# Patient Record
Sex: Male | Born: 1940 | Race: White | Hispanic: No | Marital: Married | Smoking: Former smoker
Health system: Southern US, Community
[De-identification: ages and names within clinical notes are randomized; demographics above are authoritative.]

## PROBLEM LIST (undated history)

## (undated) DIAGNOSIS — M459 Ankylosing spondylitis of unspecified sites in spine: Secondary | ICD-10-CM

## (undated) DIAGNOSIS — M1990 Unspecified osteoarthritis, unspecified site: Secondary | ICD-10-CM

## (undated) DIAGNOSIS — I10 Essential (primary) hypertension: Secondary | ICD-10-CM

## (undated) DIAGNOSIS — I639 Cerebral infarction, unspecified: Secondary | ICD-10-CM

## (undated) DIAGNOSIS — G629 Polyneuropathy, unspecified: Secondary | ICD-10-CM

## (undated) DIAGNOSIS — G4730 Sleep apnea, unspecified: Secondary | ICD-10-CM

## (undated) DIAGNOSIS — G40909 Epilepsy, unspecified, not intractable, without status epilepticus: Secondary | ICD-10-CM

## (undated) DIAGNOSIS — E119 Type 2 diabetes mellitus without complications: Secondary | ICD-10-CM

## (undated) DIAGNOSIS — E785 Hyperlipidemia, unspecified: Secondary | ICD-10-CM

## (undated) HISTORY — DX: Ankylosing spondylitis of unspecified sites in spine: M45.9

## (undated) HISTORY — DX: Unspecified osteoarthritis, unspecified site: M19.90

## (undated) HISTORY — PX: NO PAST SURGERIES: SHX2092

## (undated) HISTORY — DX: Sleep apnea, unspecified: G47.30

## (undated) HISTORY — DX: Type 2 diabetes mellitus without complications: E11.9

## (undated) HISTORY — DX: Essential (primary) hypertension: I10

## (undated) HISTORY — DX: Epilepsy, unspecified, not intractable, without status epilepticus: G40.909

## (undated) HISTORY — DX: Hyperlipidemia, unspecified: E78.5

## (undated) HISTORY — DX: Cerebral infarction, unspecified: I63.9

---
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
## 1997-12-29 ENCOUNTER — Ambulatory Visit (HOSPITAL_COMMUNITY): Admission: RE | Admit: 1997-12-29 | Discharge: 1997-12-29 | Payer: Self-pay | Admitting: Family Medicine

## 2000-12-09 ENCOUNTER — Ambulatory Visit (HOSPITAL_COMMUNITY): Admission: RE | Admit: 2000-12-09 | Discharge: 2000-12-09 | Payer: Self-pay | Admitting: Family Medicine

## 2000-12-09 ENCOUNTER — Encounter: Payer: Self-pay | Admitting: Family Medicine

## 2001-06-16 ENCOUNTER — Ambulatory Visit (HOSPITAL_COMMUNITY): Admission: RE | Admit: 2001-06-16 | Discharge: 2001-06-16 | Payer: Self-pay | Admitting: Family Medicine

## 2002-04-06 ENCOUNTER — Ambulatory Visit (HOSPITAL_COMMUNITY): Admission: RE | Admit: 2002-04-06 | Discharge: 2002-04-06 | Payer: Self-pay | Admitting: Neurology

## 2002-04-27 ENCOUNTER — Ambulatory Visit (HOSPITAL_BASED_OUTPATIENT_CLINIC_OR_DEPARTMENT_OTHER): Admission: RE | Admit: 2002-04-27 | Discharge: 2002-04-27 | Payer: Self-pay | Admitting: Neurology

## 2002-04-28 ENCOUNTER — Ambulatory Visit (HOSPITAL_BASED_OUTPATIENT_CLINIC_OR_DEPARTMENT_OTHER): Admission: RE | Admit: 2002-04-28 | Discharge: 2002-04-28 | Payer: Self-pay | Admitting: Neurology

## 2009-05-23 DIAGNOSIS — G40909 Epilepsy, unspecified, not intractable, without status epilepticus: Secondary | ICD-10-CM

## 2009-05-23 HISTORY — DX: Epilepsy, unspecified, not intractable, without status epilepticus: G40.909

## 2009-06-10 ENCOUNTER — Ambulatory Visit: Payer: Self-pay | Admitting: Cardiology

## 2009-06-10 ENCOUNTER — Inpatient Hospital Stay (HOSPITAL_COMMUNITY): Admission: EM | Admit: 2009-06-10 | Discharge: 2009-06-15 | Payer: Self-pay | Admitting: Emergency Medicine

## 2009-06-13 ENCOUNTER — Encounter (INDEPENDENT_AMBULATORY_CARE_PROVIDER_SITE_OTHER): Payer: Self-pay | Admitting: Neurology

## 2009-06-19 ENCOUNTER — Encounter (INDEPENDENT_AMBULATORY_CARE_PROVIDER_SITE_OTHER): Payer: Self-pay | Admitting: *Deleted

## 2009-06-19 ENCOUNTER — Ambulatory Visit: Payer: Self-pay | Admitting: Internal Medicine

## 2009-06-19 ENCOUNTER — Ambulatory Visit: Payer: Self-pay

## 2009-06-19 ENCOUNTER — Encounter (HOSPITAL_COMMUNITY): Admission: RE | Admit: 2009-06-19 | Discharge: 2009-08-23 | Payer: Self-pay | Admitting: Internal Medicine

## 2009-07-03 ENCOUNTER — Encounter: Payer: Self-pay | Admitting: Internal Medicine

## 2009-07-06 ENCOUNTER — Telehealth: Payer: Self-pay | Admitting: Internal Medicine

## 2009-11-07 ENCOUNTER — Telehealth (INDEPENDENT_AMBULATORY_CARE_PROVIDER_SITE_OTHER): Payer: Self-pay | Admitting: *Deleted

## 2010-04-26 NOTE — Assessment & Plan Note (Signed)
Summary: Cardiology Nuclear Study  Nuclear Med Background Indications for Stress Test: Evaluation for Ischemia, Post Hospital  Indications Comments: 06/10/09 New onset seizure activity, Increased cardiac enzymes  History: GXT  History Comments: 4-5 yrs. ago [id number]  Symptoms: Dizziness, Fatigue    Nuclear Pre-Procedure Cardiac Risk Factors: CVA, History of Smoking, Hypertension, Lipids, NIDDM Caffeine/Decaff Intake: None NPO After: 7:00 AM Lungs: Clear.  O2 Sat 99% on RA. IV 0.9% NS with Angio Cath: 22g     IV Site: (R) Wrist IV Started by: Irean Hong RN Chest Size (in) 44     Height (in): 70 Weight (lb): 198 BMI: 28.51 Tech Comments: The patient took metoprolol this am, and also complaining of stiffness in back since seizure episode, changed to [place].  Patsy Edwards,RN.  Nuclear Med Study 1 or 2 day study:  1 day     Stress Test Type:  Eugenie Birks Reading MD:  Dietrich Pates, MD     Referring MD:  Arvilla Meres, MD Resting Radionuclide:  Technetium 31m Tetrofosmin     Resting Radionuclide Dose:  10 mCi  Stress Radionuclide:  Technetium 48m Tetrofosmin     Stress Radionuclide Dose:  33 mCi   Stress Protocol   Lexiscan: 0.4 mg   Stress Test Technologist:  [employer] CMA-N     Nuclear Technologist:  Domenic Polite CNMT  Rest Procedure  Myocardial perfusion imaging was performed at rest 45 minutes following the intravenous administration of Myoview Technetium 45m Tetrofosmin.  Stress Procedure  The patient received IV Lexiscan 0.4 mg over 15-seconds.  Myoview injected at 30-seconds.  There were no significant changes with lexiscan.  There were frequent PVC's with couplets.  Quantitative spect images were obtained after a 45 minute delay.  QPS Raw Data Images:  Soft tissue (diaphragm, bowel activity) underlie heart. Stress Images:  There is normal uptake in all areas. Rest Images:  Normal with minimal apical thinning Subtraction (SDS):  No evidence of  ischemia. Transient Ischemic Dilatation:  1.11  (Normal <1.22)  Lung/Heart Ratio:  .36  (Normal <0.45)  Quantitative Gated Spect Images QGS EDV:  131 ml QGS ESV:  63 ml QGS EF:  52 %   Overall Impression  Exercise Capacity: Lexiscan protocol BP Response: Normal blood pressure response. Clinical Symptoms: Short of breath ECG Impression: No significant ST segment change suggestive of ischemia.  Occasional PVCs, couplets Overall Impression Comments: Normal perfusion.  No ischemia.  LVEF 52%, by visual estimate appears somewhat greater.  Appended Document: Cardiology Nuclear Study looks good. no further cardiac w/u at this time.  Appended Document: Cardiology Nuclear Study have attempted to call pt for a week w/busy tone, letter mailed  Appended Document: Cardiology Nuclear Study pt called back and received results

## 2010-04-26 NOTE — Progress Notes (Signed)
Summary: returning call  Phone Note Call from Patient Call back at Home Phone [phone] Call back at [phone]   Caller: Patient Summary of Call: returning call  Initial call taken by: Migdalia Dk,  July 06, 2009 11:33 AM  Follow-up for Phone Call        pt aware of stress test results Meredith Staggers, RN  July 06, 2009 1:18 PM

## 2010-04-26 NOTE — Miscellaneous (Signed)
Summary: Outpatient Coinsurance Notice  Outpatient Coinsurance Notice   Imported By: Marylou Mccoy 06/29/2009 08:45:20  _____________________________________________________________________  External Attachment:    Type:   Image     Comment:   External Document

## 2010-04-26 NOTE — Progress Notes (Signed)
  Pt Signed ROI,Faxed his Stress over to [place] per his request. Erle Crocker  November 07, 2009 9:39 AM

## 2010-04-26 NOTE — Letter (Signed)
Summary: Generic Letter  Architectural technologist, Main Office  [address]   [place], [place] [address]   Phone: [phone]  Fax: [phone]        July 03, 2009 MRN: [id number]    Andrew Jones [address] [place], [place]  [address]    Dear Andrew Jones,  We have been unable to reach you by phone regarding your test results.  Please give our office a call to receive your results.     Sincerely,  Meredith Staggers, RN Arvilla Meres, MD  This letter has been electronically signed by your physician.

## 2010-06-18 LAB — URINALYSIS, ROUTINE W REFLEX MICROSCOPIC
Glucose, UA: NEGATIVE mg/dL
Hgb urine dipstick: NEGATIVE
Ketones, ur: NEGATIVE mg/dL
Protein, ur: NEGATIVE mg/dL
pH: 6 (ref 5.0–8.0)

## 2010-06-18 LAB — RAPID URINE DRUG SCREEN, HOSP PERFORMED
Amphetamines: NOT DETECTED
Barbiturates: NOT DETECTED
Benzodiazepines: POSITIVE — AB
Tetrahydrocannabinol: NOT DETECTED

## 2010-06-18 LAB — GLUCOSE, CAPILLARY
Glucose-Capillary: 100 mg/dL — ABNORMAL HIGH (ref 70–99)
Glucose-Capillary: 109 mg/dL — ABNORMAL HIGH (ref 70–99)
Glucose-Capillary: 116 mg/dL — ABNORMAL HIGH (ref 70–99)
Glucose-Capillary: 122 mg/dL — ABNORMAL HIGH (ref 70–99)
Glucose-Capillary: 126 mg/dL — ABNORMAL HIGH (ref 70–99)
Glucose-Capillary: 137 mg/dL — ABNORMAL HIGH (ref 70–99)
Glucose-Capillary: 140 mg/dL — ABNORMAL HIGH (ref 70–99)
Glucose-Capillary: 162 mg/dL — ABNORMAL HIGH (ref 70–99)
Glucose-Capillary: 208 mg/dL — ABNORMAL HIGH (ref 70–99)
Glucose-Capillary: 215 mg/dL — ABNORMAL HIGH (ref 70–99)
Glucose-Capillary: 274 mg/dL — ABNORMAL HIGH (ref 70–99)
Glucose-Capillary: 84 mg/dL (ref 70–99)

## 2010-06-18 LAB — COMPREHENSIVE METABOLIC PANEL
ALT: 17 U/L (ref 0–53)
AST: 24 U/L (ref 0–37)
Albumin: 3.7 g/dL (ref 3.5–5.2)
Alkaline Phosphatase: 65 U/L (ref 39–117)
CO2: 18 mEq/L — ABNORMAL LOW (ref 19–32)
Calcium: 9 mg/dL (ref 8.4–10.5)
Glucose, Bld: 186 mg/dL — ABNORMAL HIGH (ref 70–99)
Potassium: 5.1 mEq/L (ref 3.5–5.1)
Sodium: 136 mEq/L (ref 135–145)
Total Bilirubin: 0.7 mg/dL (ref 0.3–1.2)

## 2010-06-18 LAB — URINALYSIS, MICROSCOPIC ONLY
Glucose, UA: NEGATIVE mg/dL
Ketones, ur: >80 mg/dL — AB
pH: 5.5 (ref 5.0–8.0)

## 2010-06-18 LAB — BASIC METABOLIC PANEL
BUN: 15 mg/dL (ref 6–23)
Calcium: 8.8 mg/dL (ref 8.4–10.5)
Creatinine, Ser: 1 mg/dL (ref 0.4–1.5)
GFR calc non Af Amer: >60 mL/min (ref >60)
Potassium: 4.1 mEq/L (ref 3.5–5.1)

## 2010-06-18 LAB — URINE CULTURE

## 2010-06-18 LAB — PROTIME-INR: INR: 1.13 (ref 0.00–1.49)

## 2010-06-18 LAB — CBC
HCT: 34.4 % — ABNORMAL LOW (ref 39.0–52.0)
HCT: 36.2 % — ABNORMAL LOW (ref 39.0–52.0)
Hemoglobin: 11.9 g/dL — ABNORMAL LOW (ref 13.0–17.0)
Hemoglobin: 12.4 g/dL — ABNORMAL LOW (ref 13.0–17.0)
MCHC: 34.3 g/dL (ref 30.0–36.0)
MCHC: 34.7 g/dL (ref 30.0–36.0)
MCV: 87.9 fL (ref 78.0–100.0)
RBC: 3.92 MIL/uL — ABNORMAL LOW (ref 4.22–5.81)
RBC: 4.06 MIL/uL — ABNORMAL LOW (ref 4.22–5.81)
RDW: 14.7 % (ref 11.5–15.5)

## 2010-06-18 LAB — CULTURE, BLOOD (ROUTINE X 2)
Culture: NO GROWTH
Culture: NO GROWTH

## 2010-06-18 LAB — POCT I-STAT, CHEM 8
Glucose, Bld: 183 mg/dL — ABNORMAL HIGH (ref 70–99)
HCT: 36 % — ABNORMAL LOW (ref 39.0–52.0)
Hemoglobin: 12.2 g/dL — ABNORMAL LOW (ref 13.0–17.0)
Potassium: 5 mEq/L (ref 3.5–5.1)
Sodium: 137 mEq/L (ref 135–145)

## 2010-06-18 LAB — DIFFERENTIAL
Eosinophils Relative: 2 % (ref 0–5)
Lymphocytes Relative: 17 % (ref 12–46)
Monocytes Absolute: 0.5 10*3/uL (ref 0.1–1.0)
Neutro Abs: 9.9 10*3/uL — ABNORMAL HIGH (ref 1.7–7.7)

## 2010-06-18 LAB — PHENYTOIN LEVEL, TOTAL: Phenytoin Lvl: 3 ug/mL — ABNORMAL LOW (ref 10.0–20.0)

## 2010-06-18 LAB — CARDIAC PANEL(CRET KIN+CKTOT+MB+TROPI)
Relative Index: 0.1 (ref 0.0–2.5)
Relative Index: 0.1 (ref 0.0–2.5)
Total CK: 6280 U/L — ABNORMAL HIGH (ref 7–232)

## 2010-06-18 LAB — APTT: aPTT: 28 seconds (ref 24–37)

## 2010-06-18 LAB — MRSA PCR SCREENING: MRSA by PCR: NEGATIVE

## 2010-06-18 LAB — CK: Total CK: 377 U/L — ABNORMAL HIGH (ref 7–232)

## 2010-06-18 LAB — POCT CARDIAC MARKERS

## 2011-07-10 LAB — BASIC METABOLIC PANEL
Creatinine: 0.8 mg/dL (ref 0.6–1.3)
Glucose: 119 mg/dL
Potassium: 4.7 mmol/L (ref 3.4–5.3)
Sodium: 137 mmol/L (ref 137–147)

## 2011-07-10 LAB — HEPATIC FUNCTION PANEL: AST: 17 U/L (ref 14–40)

## 2011-07-10 LAB — TSH: TSH: 1.9 u[IU]/mL (ref 0.41–5.90)

## 2011-09-16 ENCOUNTER — Encounter: Payer: Self-pay | Admitting: Internal Medicine

## 2011-09-16 ENCOUNTER — Ambulatory Visit (INDEPENDENT_AMBULATORY_CARE_PROVIDER_SITE_OTHER): Payer: Medicare Other | Admitting: Internal Medicine

## 2011-09-16 VITALS — BP 136/64 | HR 69 | Temp 97.9°F | Resp 16 | Ht 68.0 in | Wt 184.8 lb

## 2011-09-16 DIAGNOSIS — Z0000 Encounter for general adult medical examination without abnormal findings: Secondary | ICD-10-CM

## 2011-09-16 DIAGNOSIS — I639 Cerebral infarction, unspecified: Secondary | ICD-10-CM | POA: Insufficient documentation

## 2011-09-16 DIAGNOSIS — E119 Type 2 diabetes mellitus without complications: Secondary | ICD-10-CM

## 2011-09-16 DIAGNOSIS — M1990 Unspecified osteoarthritis, unspecified site: Secondary | ICD-10-CM | POA: Insufficient documentation

## 2011-09-16 DIAGNOSIS — G40909 Epilepsy, unspecified, not intractable, without status epilepticus: Secondary | ICD-10-CM

## 2011-09-16 DIAGNOSIS — Z1211 Encounter for screening for malignant neoplasm of colon: Secondary | ICD-10-CM

## 2011-09-16 DIAGNOSIS — G44009 Cluster headache syndrome, unspecified, not intractable: Secondary | ICD-10-CM | POA: Insufficient documentation

## 2011-09-16 DIAGNOSIS — I10 Essential (primary) hypertension: Secondary | ICD-10-CM

## 2011-09-16 DIAGNOSIS — E785 Hyperlipidemia, unspecified: Secondary | ICD-10-CM | POA: Insufficient documentation

## 2011-09-16 DIAGNOSIS — Z23 Encounter for immunization: Secondary | ICD-10-CM

## 2011-09-16 DIAGNOSIS — E1169 Type 2 diabetes mellitus with other specified complication: Secondary | ICD-10-CM | POA: Insufficient documentation

## 2011-09-16 NOTE — Assessment & Plan Note (Signed)
BP Readings from Last 3 Encounters:  09/16/11 136/64   The current medical regimen is effective;  continue present plan and medications.

## 2011-09-16 NOTE — Assessment & Plan Note (Signed)
The current medical regimen is effective;  continue present plan and medications. Lab Results  Component Value Date   HGBA1C 5.4 07/10/2011

## 2011-09-16 NOTE — Progress Notes (Signed)
Subjective:    Patient ID: Andrew Jones, male    DOB: [birth year], 71 y.o.   MRN: [id number]  HPI  New pt to me and our practice, here to est care - prev followed at [facility] and annually at [facility] Here to establish care  Also here for medicare wellness  Diet: heart healthy and diabetic Physical activity: active Depression/mood screen: negative Hearing: intact to whispered voice (B hearing aides intact) Visual acuity: grossly normal, performs annual eye exam  ADLs: capable Fall risk: none Home safety: good Cognitive evaluation: intact to orientation, naming, recall and repetition EOL planning: adv directives, full code/ I agree  I have personally reviewed and have noted 1. The patient's medical and social history 2. Their use of alcohol, tobacco or illicit drugs 3. Their current medications and supplements 4. The patient's functional ability including ADL's, fall risks, home safety risks and hearing or visual impairment. 5. Diet and physical activities 6. Evidence for depression or mood disorders   Also reviewed chronic medical issues  DM2 - on met + pioglip - the patient reports compliance with medication(s) as prescribed. Denies adverse side effects. Checks cbgs 3-4x/wk - no hypoglycema  HTN - the patient reports compliance with medication(s) as prescribed. Denies adverse side effects.  AS - dx 2000 - pain in LBP and hips as well as R MCPs - controlled with daily NSAID - no change on Enbrel therapy so stopped same in 2008   Past Medical History  Diagnosis Date  . Diabetes mellitus, type 2   . Seizure disorder 05/2009  . Hypertension   . Dyslipidemia   . CVA (cerebral vascular accident) 1999, 2002    no residual problems  . Cluster headaches   . Osteoarthritis   . Ankylosing spondylitis     back, hips - no response to embrel in 2000   Family History  Problem Relation Age of Onset  . Heart failure Mother   . COPD Father    History  Substance Use Topics  .  Smoking status: Former Games developer  . Smokeless tobacco: Not on file  . Alcohol Use: Not on file    Review of Systems Constitutional: Negative for fever or weight change.  Respiratory: Negative for cough and shortness of breath.   Cardiovascular: Negative for chest pain or palpitations.  Gastrointestinal: Negative for abdominal pain, no bowel changes.  Musculoskeletal: Negative for gait problem or joint swelling.  Skin: Negative for rash.  Neurological: Negative for dizziness or headache.  No other specific complaints in a complete review of systems (except as listed in HPI above).     Objective:   Physical Exam BP 136/64  Pulse 69  Temp 97.9 F (36.6 C) (Oral)  Resp 16  Ht 5\' 8"  (1.727 m)  Wt 184 lb 12 oz (83.802 kg)  BMI 28.09 kg/m2  SpO2 98% Wt Readings from Last 3 Encounters:  09/16/11 184 lb 12 oz (83.802 kg)  06/19/09 198 lb (89.812 kg)   Constitutional:  He appears well-developed and well-nourished. No distress.  Neck: Normal range of motion. Neck supple. No JVD present. No thyromegaly present.  Cardiovascular: Normal rate, regular rhythm and normal heart sounds.  No murmur heard. no BLE edema Pulmonary/Chest: Effort normal and breath sounds normal. No respiratory distress. no wheezes.  Abdominal: Soft. Bowel sounds are normal. Patient exhibits no distension. There is no tenderness.  Musculoskeletal: Normal range of motion. Patient exhibits no edema.  Neurological: he is alert and oriented to person, place, and time. No  cranial nerve deficit. Coordination normal.  Skin: Skin is warm and dry.  No erythema or ulceration.  Psychiatric: he has a normal mood and affect. behavior is normal. Judgment and thought content normal.   Lab Results  Component Value Date   WBC 12.8* 06/11/2009   HGB 11.9* 06/11/2009   HCT 34.4* 06/11/2009   PLT 201 06/11/2009   GLUCOSE 158* 06/11/2009   ALT 22 07/10/2011   AST 17 07/10/2011   NA 137 07/10/2011   K 4.7 07/10/2011   CL 106 06/11/2009    CREATININE 0.8 07/10/2011   BUN 22* 07/10/2011   CO2 21 06/11/2009   TSH 1.90 07/10/2011   INR 1.13 06/10/2009   HGBA1C 5.4 07/10/2011        Assessment & Plan:    CPX/v70.0 - Patient has been counseled on age-appropriate routine health concerns for screening and prevention. These are reviewed and up-to-date. Immunizations are up-to-date or declined. Labs from [place] 06/2011 reviewed.

## 2011-09-16 NOTE — Assessment & Plan Note (Signed)
On prava and lopid - [facility] The current medical regimen is effective;  continue present plan and medications.  

## 2011-09-16 NOTE — Patient Instructions (Signed)
It was good to see you today. We have reviewed your prior records including labs and tests today Medications reviewed, no changes at this time. Health Maintenance reviewed tetanus updated, refer for colonoscopy -all other recommended immunizations and age-appropriate screenings are up-to-date. we'll make referral to [facility] for colonoscopy screening. Our office will contact you regarding appointment(s) once made. Please schedule followup in 6 months, call sooner if problems.

## 2011-09-16 NOTE — Assessment & Plan Note (Signed)
No breakthru events since 2011 event The current medical regimen is effective;  continue present plan and medications.  

## 2011-09-18 ENCOUNTER — Encounter: Payer: Self-pay | Admitting: Internal Medicine

## 2011-10-30 ENCOUNTER — Encounter: Payer: Self-pay | Admitting: Internal Medicine

## 2011-10-30 ENCOUNTER — Ambulatory Visit (AMBULATORY_SURGERY_CENTER): Payer: Medicare Other | Admitting: *Deleted

## 2011-10-30 VITALS — Ht 68.0 in | Wt 191.7 lb

## 2011-10-30 DIAGNOSIS — Z1211 Encounter for screening for malignant neoplasm of colon: Secondary | ICD-10-CM

## 2011-10-30 MED ORDER — MOVIPREP 100 G PO SOLR: ORAL | Status: DC

## 2011-10-31 ENCOUNTER — Telehealth: Payer: Self-pay | Admitting: Internal Medicine

## 2011-10-31 ENCOUNTER — Telehealth: Payer: Self-pay

## 2011-10-31 DIAGNOSIS — Z1211 Encounter for screening for malignant neoplasm of colon: Secondary | ICD-10-CM

## 2011-10-31 MED ORDER — MOVIPREP 100 G PO SOLR: 1.0000 | Freq: Once | ORAL | Status: DC

## 2011-10-31 NOTE — Telephone Encounter (Signed)
Left message

## 2011-11-13 ENCOUNTER — Encounter: Payer: Self-pay | Admitting: Internal Medicine

## 2011-11-13 ENCOUNTER — Ambulatory Visit (AMBULATORY_SURGERY_CENTER): Payer: Medicare Other | Admitting: Internal Medicine

## 2011-11-13 VITALS — BP 145/67 | HR 65 | Temp 97.8°F | Resp 17 | Ht 68.0 in | Wt 191.0 lb

## 2011-11-13 DIAGNOSIS — D126 Benign neoplasm of colon, unspecified: Secondary | ICD-10-CM

## 2011-11-13 DIAGNOSIS — Z1211 Encounter for screening for malignant neoplasm of colon: Secondary | ICD-10-CM

## 2011-11-13 LAB — GLUCOSE, CAPILLARY: Glucose-Capillary: 125 mg/dL — ABNORMAL HIGH (ref 70–99)

## 2011-11-13 MED ORDER — SODIUM CHLORIDE 0.9 % IV SOLN: 500.0000 mL | INTRAVENOUS | Status: DC

## 2011-11-13 NOTE — Patient Instructions (Addendum)
1 polyp removed and sent to pathology.  Mild diverticulosis.    YOU HAD AN ENDOSCOPIC PROCEDURE TODAY AT THE [facility]: Refer to the procedure report that was given to you for any specific questions about what was found during the examination.  If the procedure report does not answer your questions, please call your gastroenterologist to clarify.  If you requested that your care partner not be given the details of your procedure findings, then the procedure report has been included in a sealed envelope for you to review at your convenience later.  YOU SHOULD EXPECT: Some feelings of bloating in the abdomen. Passage of more gas than usual.  Walking can help get rid of the air that was put into your GI tract during the procedure and reduce the bloating. If you had a lower endoscopy (such as a colonoscopy or flexible sigmoidoscopy) you may notice spotting of blood in your stool or on the toilet paper. If you underwent a bowel prep for your procedure, then you may not have a normal bowel movement for a few days.  DIET: Your first meal following the procedure should be a light meal and then it is ok to progress to your normal diet.  A half-sandwich or bowl of soup is an example of a good first meal.  Heavy or fried foods are harder to digest and may make you feel nauseous or bloated.  Likewise meals heavy in dairy and vegetables can cause extra gas to form and this can also increase the bloating.  Drink plenty of fluids but you should avoid alcoholic beverages for 24 hours.  ACTIVITY: Your care partner should take you home directly after the procedure.  You should plan to take it easy, moving slowly for the rest of the day.  You can resume normal activity the day after the procedure however you should NOT DRIVE or use heavy machinery for 24 hours (because of the sedation medicines used during the test).    SYMPTOMS TO REPORT IMMEDIATELY: A gastroenterologist can be reached at any hour.  During  normal business hours, 8:30 AM to 5:00 PM Monday through Friday, call [phone].  After hours and on weekends, please call the GI answering service at [phone] who will take a message and have the physician on call contact you.   Following lower endoscopy (colonoscopy or flexible sigmoidoscopy):  Excessive amounts of blood in the stool  Significant tenderness or worsening of abdominal pains  Swelling of the abdomen that is new, acute  Fever of 100F or higher  FOLLOW UP: If any biopsies were taken you will be contacted by phone or by letter within the next 1-3 weeks.  Call your gastroenterologist if you have not heard about the biopsies in 3 weeks.  Our staff will call the home number listed on your records the next business day following your procedure to check on you and address any questions or concerns that you may have at that time regarding the information given to you following your procedure. This is a courtesy call and so if there is no answer at the home number and we have not heard from you through the emergency physician on call, we will assume that you have returned to your regular daily activities without incident.  SIGNATURES/CONFIDENTIALITY: You and/or your care partner have signed paperwork which will be entered into your electronic medical record.  These signatures attest to the fact that that the information above on your After Visit Summary has  been reviewed and is understood.  Full responsibility of the confidentiality of this discharge information lies with you and/or your care-partner.  

## 2011-11-13 NOTE — Progress Notes (Signed)
Patient did not have preoperative order for IV antibiotic SSI prophylaxis. (G8918)  Patient did not experience any of the following events: a burn prior to discharge; a fall within the facility; wrong site/side/patient/procedure/implant event; or a hospital transfer or hospital admission upon discharge from the facility. (G8907)  

## 2011-11-13 NOTE — Op Note (Signed)
[facility] [address] [place], [address]   COLONOSCOPY PROCEDURE REPORT  PATIENT: Andrew Jones, Andrew Jones  MR#: [id number] BIRTHDATE: Feb 18, 1941 , 70  yrs. old GENDER: Male ENDOSCOPIST: Roxy Cedar, MD REFERRED [id number] Felicity Coyer, M.D. PROCEDURE DATE:  11/13/2011 PROCEDURE:   Colonoscopy with snare polypectomy    x 1 ASA CLASS:   Class II INDICATIONS:average risk screening. MEDICATIONS: propofol (Diprivan) 250mg  IV  DESCRIPTION OF PROCEDURE:   After the risks benefits and alternatives of the procedure were thoroughly explained, informed consent was obtained.  A digital rectal exam revealed no abnormalities of the rectum.   The LB [id number]  endoscope was introduced through the anus and advanced to the cecum, which was identified by both the appendix and ileocecal valve. No adverse events experienced.   The quality of the prep was good, using MoviPrep  The instrument was then slowly withdrawn as the colon was fully examined.      COLON FINDINGS: A sessile polyp measuring 8 mm in size was found in the ascending colon.  A polypectomy was performed with a cold snare.  The resection was complete and the polyp tissue was completely retrieved.   Mild diverticulosis was noted throughout the entire examined colon.   The colon mucosa was otherwise normal. Retroflexed views revealed no abnormalities. The time to cecum=1.42 minutes  Withdrawal time=13.17 minutes.  The scope was withdrawn and the procedure completed. COMPLICATIONS: There were no complications.  ENDOSCOPIC IMPRESSION: 1.   Sessile polyp measuring 8 mm in size was found in the ascending colon; polypectomy was performed with a cold snare 2.   Mild diverticulosis was noted throughout the entire examined colon 3.   The colon mucosa was otherwise normal  RECOMMENDATIONS: Repeat colonoscopy in 5 years if polyp adenomatous; otherwise 10 years  eSigned:  Roxy Cedar, MD 11/13/2011 11:15  AM   cc: Newt Lukes, MD;  The Patient

## 2011-11-14 ENCOUNTER — Telehealth: Payer: Self-pay | Admitting: *Deleted

## 2011-11-14 NOTE — Telephone Encounter (Signed)
  Follow up Call-  Call back number 11/13/2011  Post procedure Call Back phone  # [phone]  Permission to leave phone message Yes     Patient questions:  Left message to call if necessary.

## 2011-11-19 ENCOUNTER — Encounter: Payer: Self-pay | Admitting: Internal Medicine

## 2012-01-23 LAB — HEPATIC FUNCTION PANEL: Alkaline Phosphatase: 77 U/L (ref 25–125)

## 2012-01-23 LAB — LIPID PANEL
Cholesterol: 186 mg/dL (ref 0–200)
LDL Cholesterol: 113 mg/dL
Triglycerides: 81 mg/dL (ref 40–160)

## 2012-01-23 LAB — BASIC METABOLIC PANEL: Sodium: 134 mmol/L — AB (ref 137–147)

## 2012-01-30 ENCOUNTER — Encounter: Payer: Self-pay | Admitting: Internal Medicine

## 2012-03-09 ENCOUNTER — Encounter: Payer: Self-pay | Admitting: Internal Medicine

## 2012-03-09 ENCOUNTER — Ambulatory Visit (INDEPENDENT_AMBULATORY_CARE_PROVIDER_SITE_OTHER): Payer: Medicare Other | Admitting: Internal Medicine

## 2012-03-09 VITALS — BP 130/62 | HR 72 | Temp 97.7°F | Ht 68.0 in | Wt 194.4 lb

## 2012-03-09 DIAGNOSIS — E119 Type 2 diabetes mellitus without complications: Secondary | ICD-10-CM

## 2012-03-09 DIAGNOSIS — G40909 Epilepsy, unspecified, not intractable, without status epilepticus: Secondary | ICD-10-CM

## 2012-03-09 DIAGNOSIS — I10 Essential (primary) hypertension: Secondary | ICD-10-CM

## 2012-03-09 DIAGNOSIS — E785 Hyperlipidemia, unspecified: Secondary | ICD-10-CM

## 2012-03-09 NOTE — Assessment & Plan Note (Signed)
The current medical regimen is effective;  continue present plan and medications. Lab Results  Component Value Date   HGBA1C 5.5 01/23/2012

## 2012-03-09 NOTE — Assessment & Plan Note (Signed)
No breakthru events since 2011 event The current medical regimen is effective;  continue present plan and medications.

## 2012-03-09 NOTE — Progress Notes (Signed)
  Subjective:    Patient ID: Andrew Jones, male    DOB: Jul 04, 1940, 71 y.o.   MRN: [id number]  HPI  Here for follow up - reviewed chronic medical issues  DM2 - on met + pioglip - the patient reports compliance with medication(s) as prescribed. Denies adverse side effects. Checks cbgs 3-4x/wk - no hypoglycema  HTN - the patient reports compliance with medication(s) as prescribed. Denies adverse side effects.  AS - dx 2000 - pain in LBP and hips as well as R MCPs - controlled with daily NSAID - no change on Enbrel therapy so stopped same in 2008   Past Medical History  Diagnosis Date  . Diabetes mellitus, type 2   . Seizure disorder 05/2009  . Hypertension   . Dyslipidemia   . CVA (cerebral vascular accident) 1999, 2002    no residual problems  . Cluster headaches   . Osteoarthritis   . Ankylosing spondylitis     back, hips - no response to embrel in 2000  . Sleep apnea     uses c-pap    Review of Systems  Constitutional: Negative for fever or weight change.  Respiratory: Negative for cough and shortness of breath.   Cardiovascular: Negative for chest pain or palpitations.      Objective:   Physical Exam  BP 130/62  Pulse 72  Temp 97.7 F (36.5 C) (Oral)  Ht 5\' 8"  (1.727 m)  Wt 194 lb 6.4 oz (88.179 kg)  BMI 29.56 kg/m2  SpO2 98% Wt Readings from Last 3 Encounters:  03/09/12 194 lb 6.4 oz (88.179 kg)  11/13/11 191 lb (86.637 kg)  10/30/11 191 lb 11.2 oz (86.955 kg)   Constitutional:  He appears well-developed and well-nourished. No distress.  Neck: Normal range of motion. Neck supple. No JVD present. No thyromegaly present.  Cardiovascular: Normal rate, regular rhythm and normal heart sounds.  No murmur heard. no BLE edema Pulmonary/Chest: Effort normal and breath sounds normal. No respiratory distress. no wheezes.  Psychiatric: he has a normal mood and affect. behavior is normal. Judgment and thought content normal.   Lab Results  Component Value Date   WBC  12.8* 06/11/2009   HGB 11.9* 06/11/2009   HCT 34.4* 06/11/2009   PLT 201 06/11/2009   GLUCOSE 158* 06/11/2009   CHOL 186 01/23/2012   TRIG 81 01/23/2012   HDL 57 01/23/2012   LDLCALC 113 01/23/2012   ALT 21 01/23/2012   AST 14 01/23/2012   NA 134* 01/23/2012   K 5.8* 01/23/2012   CL 106 06/11/2009   CREATININE 1.1 01/23/2012   BUN 22* 01/23/2012   CO2 21 06/11/2009   TSH 1.90 07/10/2011   INR 1.13 06/10/2009   HGBA1C 5.5 01/23/2012        Assessment & Plan:   See problem list. Medications and labs reviewed today.  Time spent with pt today 25 minutes, greater than 50% time spent counseling patient on diabetes, hypertension and medication review. [facility]

## 2012-03-09 NOTE — Patient Instructions (Signed)
It was good to see you today. We have reviewed your prior records including labs and tests today Medications reviewed, no changes at this time. [facility] about [facility] Sh[facility]Zostavax) Please schedule followup in 6-12 months, call sooner if problems.

## 2012-03-09 NOTE — Assessment & Plan Note (Signed)
On prava and lopid - lipids done annually at [facility] The current medical regimen is effective;  continue present plan and medications.

## 2012-03-09 NOTE — Assessment & Plan Note (Signed)
BP Readings from Last 3 Encounters:  03/09/12 130/62  11/13/11 145/67  09/16/11 136/64   The current medical regimen is effective;  continue present plan and medications.

## 2012-06-03 LAB — HM DIABETES EYE EXAM

## 2012-06-11 ENCOUNTER — Encounter: Payer: Self-pay | Admitting: Internal Medicine

## 2012-09-07 ENCOUNTER — Telehealth: Payer: Self-pay | Admitting: Internal Medicine

## 2012-09-07 MED ORDER — DICLOFENAC SODIUM 75 MG PO TBEC: 75.0000 mg | DELAYED_RELEASE_TABLET | Freq: Every day | ORAL | Status: DC

## 2012-09-07 NOTE — Telephone Encounter (Signed)
Pt req new prescription for Diclofenac 75 mg to be send to [facility] in [place] [phone]. Please advise. Last ov Dr. Felicity Coyer told the pt that if he need this med refill she will take care of this med for him.

## 2012-09-07 NOTE — Telephone Encounter (Signed)
Done erx 

## 2012-09-07 NOTE — Telephone Encounter (Signed)
The pt's wife came back in to report he was completely out of this medication.

## 2012-10-23 LAB — HEPATIC FUNCTION PANEL
ALT: 18 U/L (ref 10–40)
AST: 14 U/L (ref 14–40)

## 2012-10-23 LAB — LIPID PANEL
HDL: 62 mg/dL (ref 35–70)
LDL Cholesterol: 108 mg/dL

## 2012-10-23 LAB — HM DIABETES FOOT EXAM

## 2012-10-23 LAB — BASIC METABOLIC PANEL
BUN: 21 mg/dL (ref 4–21)
Glucose: 111 mg/dL
Potassium: 4.7 mmol/L (ref 3.4–5.3)
Sodium: 135 mmol/L — AB (ref 137–147)

## 2012-10-23 LAB — POCT ERYTHROCYTE SEDIMENTATION RATE, NON-AUTOMATED: Sed Rate: 23 mm

## 2012-10-23 LAB — HEMOGLOBIN A1C: Hgb A1c MFr Bld: 5.5 % (ref 4.0–6.0)

## 2012-12-08 ENCOUNTER — Ambulatory Visit (INDEPENDENT_AMBULATORY_CARE_PROVIDER_SITE_OTHER): Payer: Medicare Other | Admitting: Nurse Practitioner

## 2012-12-08 ENCOUNTER — Encounter: Payer: Self-pay | Admitting: Nurse Practitioner

## 2012-12-08 VITALS — BP 118/65 | HR 66 | Ht 68.5 in | Wt 197.0 lb

## 2012-12-08 DIAGNOSIS — G40309 Generalized idiopathic epilepsy and epileptic syndromes, not intractable, without status epilepticus: Secondary | ICD-10-CM

## 2012-12-08 DIAGNOSIS — Z79899 Other long term (current) drug therapy: Secondary | ICD-10-CM

## 2012-12-08 DIAGNOSIS — Z5181 Encounter for therapeutic drug level monitoring: Secondary | ICD-10-CM | POA: Insufficient documentation

## 2012-12-08 DIAGNOSIS — G40909 Epilepsy, unspecified, not intractable, without status epilepticus: Secondary | ICD-10-CM

## 2012-12-08 NOTE — Progress Notes (Signed)
[facility]  PATIENT: INDIGO CHADDOCK DOB: 1940/10/18   REASON FOR VISIT: Followup for seizure disorder  HISTORY OF PRESENT ILLNESS:. Mr. Gammel,  72 year male who had  generalized seizures in March 2011, 2 episodes at that time.  He has has normal MRI and EEG.  No prior history of seizures.  Last Dilantin level was 4.1% on 11/15/10.  He was increased to 400mg  Dilantin daily.    He is here today for follow up since his last visit on 12/11/11.    He has been tolerating Dilantin 400mg  daily without nausea, dizziness or tremors.  He has not had any seizure activity.  No new neurologic complaints.  He changed his Dilantin to generic. He gets his medications from the [place]. No problems with generic.   No falls, no new neurologic complaints. Recent labs done at the [place], patient will send to [place].    REVIEW OF SYSTEMS: Full 14 system review of systems performed and notable only for:  Constitutional: N/A  Cardiovascular: N/A  Ear/Nose/Throat: N/A  Skin: N/A  Eyes: N/A  Respiratory: N/A  Gastroitestinal: N/A  Hematology/Lymphatic: N/A  Endocrine: N/A Musculoskeletal: Joint pain, joint swelling Allergy/Immunology: N/A  Neurological: N/A Psychiatric: N/A   ALLERGIES: No Known Allergies  HOME MEDICATIONS: Outpatient Prescriptions Prior to Visit  Medication Sig Dispense Refill  . amLODipine (NORVASC) 5 MG tablet Take 5 mg by mouth daily.      Marland Kitchen aspirin 81 MG tablet Take 81 mg by mouth daily.      . benazepril (LOTENSIN) 10 MG tablet Take 10 mg by mouth daily.      . cholecalciferol (VITAMIN D) 1000 UNITS tablet Take 1,000 Units by mouth daily.      . diclofenac (VOLTAREN) 75 MG EC tablet Take 1 tablet (75 mg total) by mouth daily.  30 tablet  5  . fish oil-omega-3 fatty acids 1000 MG capsule Take 2 g by mouth daily.      Marland Kitchen gemfibrozil (LOPID) 600 MG tablet Take 600 mg by mouth 2 (two) times daily before a meal.      . metFORMIN (GLUCOPHAGE) 1000 MG tablet Take 1,000 mg by  mouth 2 (two) times daily with a meal.      . metoprolol tartrate (LOPRESSOR) 25 MG tablet Take 25 mg by mouth 2 (two) times daily.      . Multiple Vitamin (MULTIVITAMIN) tablet Take 1 tablet by mouth daily.      . phenytoin (DILANTIN) 100 MG ER capsule Take 100 mg by mouth 4 (four) times daily - after meals and at bedtime.      . pioglitazone (ACTOS) 30 MG tablet Take 30 mg by mouth daily.      . pravastatin (PRAVACHOL) 10 MG tablet Take 10 mg by mouth daily.       No facility-administered medications prior to visit.    PAST MEDICAL HISTORY: Past Medical History  Diagnosis Date  . Diabetes mellitus, type 2   . Seizure disorder 05/2009  . Hypertension   . Dyslipidemia   . CVA (cerebral vascular accident) 1999, 2002    no residual problems  . Cluster headaches   . Osteoarthritis   . Ankylosing spondylitis     back, hips - no response to embrel in 2000  . Sleep apnea     uses c-pap    PAST SURGICAL HISTORY: Past Surgical History  Procedure Laterality Date  . No past surgeries      FAMILY HISTORY: Family  History  Problem Relation Age of Onset  . Heart failure Mother   . Heart disease Mother   . COPD Father   . Prostate cancer Brother     SOCIAL HISTORY: History   Social History  . Marital Status: Married    Spouse Name: N/A    Number of Children: N/A  . Years of Education: N/A   Occupational History  . Not on file.   Social History Main Topics  . Smoking status: Former Games developer  . Smokeless tobacco: Never Used  . Alcohol Use: 3.6 oz/week    6 Cans of beer per week     Comment: weekly   . Drug Use: No  . Sexual Activity: Not on file   Other Topics Concern  . Not on file   Social History Narrative  . No narrative on file     PHYSICAL EXAM  Filed Vitals:   12/08/12 1311  BP: 118/65  Pulse: 66  Height: 5' 8.5" (1.74 m)  Weight: 197 lb (89.359 kg)   Body mass index is 29.51 kg/(m^2).  Generalized: Well developed, in no acute distress  Head:  normocephalic and atraumatic,. Oropharynx benign  Neck: Supple, no carotid bruits  Cardiac: Regular rate rhythm, no murmur     Neurological examination   Mentation: Alert oriented to time, place, history taking. Follows all commands speech and language fluent  Cranial nerve II-XII: Pupils were equal round reactive to light extraocular movements were full, visual field were full on confrontational test. Facial sensation and strength were normal. hearing was intact to finger rubbing bilaterally. Uvula tongue midline. head turning and shoulder shrug and were normal and symmetric.Tongue protrusion into cheek strength was normal. Motor: normal bulk and tone, full strength in the BUE, BLE, fine finger movements normal, no pronator drift. No focal weakness Sensory: normal and symmetric to light touch, pinprick, and  vibration  Coordination: finger-nose-finger, heel-to-shin bilaterally, no dysmetria Reflexes: Brachioradialis 2/2, biceps 2/2, triceps 2/2, patellar 2/2, Achilles 2/2, plantar responses were flexor bilaterally. Gait and Station: Rising up from seated position without assistance, normal stance,  moderate stride, good arm swing, smooth turning, able to perform tiptoe, and heel walking without difficulty. Steady  tandem gait  DIAGNOSTIC DATA (LABS, IMAGING, TESTING) -To review   ASSESSMENT AND PLAN  72 y.o. year old male  has a past medical history of Diabetes mellitus, type 2; Seizure disorder (05/2009); Hypertension; Dyslipidemia; CVA (cerebral vascular accident) (1999, 2002); Cluster headaches; Osteoarthritis; Ankylosing spondylitis; and Sleep apnea. No seizures since 2011  Pt will continue Dilantin at current dose, 400mg  daily. Gets meds from [place] Will send labs to our office F/U in 1 year  Nilda Riggs, [facility], [facility], APRN  [facility] [address], [address] [place], [place] [address] [phone]

## 2012-12-08 NOTE — Patient Instructions (Addendum)
Pt will continue Dilantin at current dose. Gets meds from [place] Will send labs to our office F/U in 1 year

## 2013-03-11 ENCOUNTER — Ambulatory Visit (INDEPENDENT_AMBULATORY_CARE_PROVIDER_SITE_OTHER): Payer: Medicare Other | Admitting: Internal Medicine

## 2013-03-11 ENCOUNTER — Encounter: Payer: Self-pay | Admitting: Internal Medicine

## 2013-03-11 VITALS — BP 120/72 | HR 66 | Temp 97.7°F | Ht 68.0 in | Wt 192.1 lb

## 2013-03-11 DIAGNOSIS — E119 Type 2 diabetes mellitus without complications: Secondary | ICD-10-CM

## 2013-03-11 DIAGNOSIS — I10 Essential (primary) hypertension: Secondary | ICD-10-CM

## 2013-03-11 DIAGNOSIS — Z0000 Encounter for general adult medical examination without abnormal findings: Secondary | ICD-10-CM

## 2013-03-11 DIAGNOSIS — E785 Hyperlipidemia, unspecified: Secondary | ICD-10-CM

## 2013-03-11 NOTE — Assessment & Plan Note (Signed)
The current medical regimen is effective;  continue present plan and medications. Lab Results  Component Value Date   HGBA1C 5.5 10/23/2012

## 2013-03-11 NOTE — Assessment & Plan Note (Signed)
BP Readings from Last 3 Encounters:  03/11/13 120/72  12/08/12 118/65  03/09/12 130/62   The current medical regimen is effective;  continue present plan and medications.

## 2013-03-11 NOTE — Progress Notes (Signed)
Pre-visit discussion using our clinic review tool. No additional management support is needed unless otherwise documented below in the visit note.  

## 2013-03-11 NOTE — Patient Instructions (Addendum)
It was good to see you today.  We have reviewed your prior records including labs and tests today  Medications reviewed and updated, no changes recommended at this time.  Continue working with the [place] as ongoing  Please schedule followup in 12 months for annual exam, call sooner if problems.  Health Maintenance, Males A healthy lifestyle and preventative care can promote health and wellness.  Maintain regular health, dental, and eye exams.  Eat a healthy diet. Foods like vegetables, fruits, whole grains, low-fat dairy products, and lean protein foods contain the nutrients you need without too many calories. Decrease your intake of foods high in solid fats, added sugars, and salt. Get information about a proper diet from your caregiver, if necessary.  Regular physical exercise is one of the most important things you can do for your health. Most adults should get at least 150 minutes of moderate-intensity exercise (any activity that increases your heart rate and causes you to sweat) each week. In addition, most adults need muscle-strengthening exercises on 2 or more days a week.   Maintain a healthy weight. The body mass index (BMI) is a screening tool to identify possible weight problems. It provides an estimate of body fat based on height and weight. Your caregiver can help determine your BMI, and can help you achieve or maintain a healthy weight. For adults 20 years and older:  A BMI below 18.5 is considered underweight.  A BMI of 18.5 to 24.9 is normal.  A BMI of 25 to 29.9 is considered overweight.  A BMI of 30 and above is considered obese.  Maintain normal blood lipids and cholesterol by exercising and minimizing your intake of saturated fat. Eat a balanced diet with plenty of fruits and vegetables. Blood tests for lipids and cholesterol should begin at age 50 and be repeated every 5 years. If your lipid or cholesterol levels are high, you are over 50, or you are a high risk for  heart disease, you may need your cholesterol levels checked more frequently.Ongoing high lipid and cholesterol levels should be treated with medicines, if diet and exercise are not effective.  If you smoke, find out from your caregiver how to quit. If you do not use tobacco, do not start.  Lung cancer screening is recommended for adults aged 23 80 years who are at high risk for developing lung cancer because of a history of smoking. Yearly low-dose computed tomography (CT) is recommended for people who have at least a 30-pack-year history of smoking and are a current smoker or have quit within the past 15 years. A pack year of smoking is smoking an average of 1 pack of cigarettes a day for 1 year (for example: 1 pack a day for 30 years or 2 packs a day for 15 years). Yearly screening should continue until the smoker has stopped smoking for at least 15 years. Yearly screening should also be stopped for people who develop a health problem that would prevent them from having lung cancer treatment.  If you choose to drink alcohol, do not exceed 2 drinks per day. One drink is considered to be 12 ounces (355 mL) of beer, 5 ounces (148 mL) of wine, or 1.5 ounces (44 mL) of liquor.  Avoid use of street drugs. Do not share needles with anyone. Ask for help if you need support or instructions about stopping the use of drugs.  High blood pressure causes heart disease and increases the risk of stroke. Blood pressure should be  checked at least every 1 to 2 years. Ongoing high blood pressure should be treated with medicines if weight loss and exercise are not effective.  If you are 57 to 70 years old, ask your caregiver if you should take aspirin to prevent heart disease.  Diabetes screening involves taking a blood sample to check your fasting blood sugar level. This should be done once every 3 years, after age 70, if you are within normal weight and without risk factors for diabetes. Testing should be considered  at a younger age or be carried out more frequently if you are overweight and have at least 1 risk factor for diabetes.  Colorectal cancer can be detected and often prevented. Most routine colorectal cancer screening begins at the age of 50 and continues through age 70. However, your caregiver may recommend screening at an earlier age if you have risk factors for colon cancer. On a yearly basis, your caregiver may provide home test kits to check for hidden blood in the stool. Use of a small camera at the end of a tube, to directly examine the colon (sigmoidoscopy or colonoscopy), can detect the earliest forms of colorectal cancer. Talk to your caregiver about this at age 32, when routine screening begins. Direct examination of the colon should be repeated every 5 to 10 years through age 64, unless early forms of pre-cancerous polyps or small growths are found.  Hepatitis C blood testing is recommended for all people born from [birth year] through 1965 and any individual with known risks for hepatitis C.  Healthy men should no longer receive prostate-specific antigen (PSA) blood tests as part of routine cancer screening. Consult with your caregiver about prostate cancer screening.  Testicular cancer screening is not recommended for adolescents or adult males who have no symptoms. Screening includes self-exam, caregiver exam, and other screening tests. Consult with your caregiver about any symptoms you have or any concerns you have about testicular cancer.  Practice safe sex. Use condoms and avoid high-risk sexual practices to reduce the spread of sexually transmitted infections (STIs).  Use sunscreen. Apply sunscreen liberally and repeatedly throughout the day. You should seek shade when your shadow is shorter than you. Protect yourself by wearing long sleeves, pants, a wide-brimmed hat, and sunglasses year round, whenever you are outdoors.  Notify your caregiver of new moles or changes in moles, especially if  there is a change in shape or color. Also notify your caregiver if a mole is larger than the size of a pencil eraser.  A one-time screening for abdominal aortic aneurysm (AAA) and surgical repair of large AAAs by sound wave imaging (ultrasonography) is recommended for ages 83 to 62 years who are current or former smokers.  Stay current with your immunizations. Document Released: 09/07/2007 Document Revised: 07/06/2012 Document Reviewed: 08/06/2010 [facility] Patient Information 2014 [place], [place]. Diabetes and Standards of Medical Care  Diabetes is complicated. You may find that your diabetes team includes a dietitian, nurse, diabetes educator, eye doctor, and more. To help everyone know what is going on and to help you get the care you deserve, the following schedule of care was developed to help keep you on track. Below are the tests, exams, vaccines, medicines, education, and plans you will need. HbA1c test This test shows how well you have controlled your glucose over the past 2 3 months. It is used to see if your diabetes management plan needs to be adjusted.   It is performed at least 2 times a year if  you are meeting treatment goals.  It is performed 4 times a year if therapy has changed or if you are not meeting treatment goals. Blood pressure test  This test is performed at every routine medical visit. The goal is less than 140/90 mmHg for most people, but 130/80 mmHg in some cases. Ask your health care provider about your goal. Dental exam  Follow up with the dentist regularly. Eye exam  If you are diagnosed with type 1 diabetes as a child, get an exam upon reaching the age of 10 years or older and have had diabetes for 3 5 years. Yearly eye exams are recommended after that initial eye exam.  If you are diagnosed with type 1 diabetes as an adult, get an exam within 5 years of diagnosis and then yearly.  If you are diagnosed with type 2 diabetes, get an exam as soon as possible  after the diagnosis and then yearly. Foot care exam  Visual foot exams are performed at every routine medical visit. The exams check for cuts, injuries, or other problems with the feet.  A comprehensive foot exam should be done yearly. This includes visual inspection as well as assessing foot pulses and testing for loss of sensation.  Check your feet nightly for cuts, injuries, or other problems with your feet. Tell your health care provider if anything is not healing. Kidney function test (urine microalbumin)  This test is performed once a year.  Type 1 diabetes: The first test is performed 5 years after diagnosis.  Type 2 diabetes: The first test is performed at the time of diagnosis.  A serum creatinine and estimated glomerular filtration rate (eGFR) test is done once a year to assess the level of chronic kidney disease (CKD), if present. Lipid profile (cholesterol, HDL, LDL, triglycerides)  Performed every 5 years for most people.  The goal for LDL is less than 100 mg/dL. If you are at high risk, the goal is less than 70 mg/dL.  The goal for HDL is 40 mg/dL 50 mg/dL for men and 50 mg/dL 60 mg/dL for women. An HDL cholesterol of 60 mg/dL or higher gives some protection against heart disease.  The goal for triglycerides is less than 150 mg/dL. Influenza vaccine, pneumococcal vaccine, and hepatitis B vaccine  The influenza vaccine is recommended yearly.  The pneumococcal vaccine is generally given once in a lifetime. However, there are some instances when another vaccination is recommended. Check with your health care provider.  The hepatitis B vaccine is also recommended for adults with diabetes. Diabetes self-management education  Education is recommended at diagnosis and ongoing as needed. Treatment plan  Your treatment plan is reviewed at every medical visit. Document Released: 01/06/2009 Document Revised: 11/11/2012 Document Reviewed: 08/11/2012 [facility] Patient  Information 2014 [place], [place].

## 2013-03-11 NOTE — Progress Notes (Signed)
Subjective:    Patient ID: Andrew Jones, male    DOB: 11-Jun-1940, 72 y.o.   MRN: [id number]  HPI patient is here today for annual physical. Patient feels well and has no complaints.  Also reviewed chronic medical issues and interval medical events  DM2 - on met + pioglip - the patient reports compliance with medication(s) as prescribed. Denies adverse side effects. Checks cbgs 3-4x/wk - no hypoglycema  HTN - the patient reports compliance with medication(s) as prescribed. Denies adverse side effects.  AS - dx 2000 - pain in LBP and hips as well as R MCPs - controlled with daily NSAID - ? Benefit of Enbrel therapy so stopped same in 2008, but resumed same 12/2012   Past Medical History  Diagnosis Date  . Diabetes mellitus, type 2   . Seizure disorder 05/2009  . Hypertension   . Dyslipidemia   . CVA (cerebral vascular accident) 1999, 2002    no residual problems  . Cluster headaches   . Osteoarthritis   . Ankylosing spondylitis     back, hips - on embrel 2000-2008, resumed 12/2012  . Sleep apnea     uses c-pap   Family History  Problem Relation Age of Onset  . Heart failure Mother   . Heart disease Mother   . COPD Father   . Prostate cancer Brother    History  Substance Use Topics  . Smoking status: Former Games developer  . Smokeless tobacco: Never Used  . Alcohol Use: 3.6 oz/week    6 Cans of beer per week     Comment: weekly     Review of Systems  Constitutional: Negative for fever, activity change, appetite change, fatigue and unexpected weight change.  Respiratory: Negative for cough, chest tightness, shortness of breath and wheezing.   Cardiovascular: Negative for chest pain, palpitations and leg swelling.  Neurological: Negative for dizziness, weakness and headaches.  Psychiatric/Behavioral: Negative for dysphoric mood. The patient is not nervous/anxious.   All other systems reviewed and are negative.       Objective:   Physical Exam BP 120/72  Pulse 66   Temp(Src) 97.7 F (36.5 C) (Oral)  Ht 5\' 8"  (1.727 m)  Wt 192 lb 1.9 oz (87.145 kg)  BMI 29.22 kg/m2  SpO2 98% Wt Readings from Last 3 Encounters:  03/11/13 192 lb 1.9 oz (87.145 kg)  12/08/12 197 lb (89.359 kg)  03/09/12 194 lb 6.4 oz (88.179 kg)   Constitutional:  He appears well-developed and well-nourished. No distress.  HENT: normocephalic atraumatic. Bilateral tympanic membranes clear with bilateral hearing aids removed. Oropharynx clear. Nose normal Neck: Normal range of motion. Neck supple. No JVD present. No thyromegaly present.  Cardiovascular: Normal rate, regular rhythm and normal heart sounds.  No murmur heard. no BLE edema Pulmonary/Chest: Effort normal and breath sounds normal. No respiratory distress. no wheezes.  Abdomen: Soft, nontender, nondistended. Bowel sounds present throughout no mass appreciable Neurologic: awake alert oriented x4, cranial nerves II through XII symmetrically intact. Speech, balance, gait normal. Moves all extremities well Psychiatric: he has a normal mood and affect. behavior is normal. Judgment and thought content normal.   Lab Results  Component Value Date   WBC 10.7 10/23/2012   HGB 12.6* 10/23/2012   HCT 34.4* 06/11/2009   PLT 316 10/23/2012   GLUCOSE 158* 06/11/2009   CHOL 192 10/23/2012   TRIG 110 10/23/2012   HDL 62 10/23/2012   LDLCALC 664 10/23/2012   ALT 18 10/23/2012   AST 14 10/23/2012  NA 135* 10/23/2012   K 4.7 10/23/2012   CL 106 06/11/2009   CREATININE 1.3 10/23/2012   BUN 21 10/23/2012   CO2 21 06/11/2009   TSH 1.90 07/10/2011   INR 1.13 06/10/2009   HGBA1C 5.5 10/23/2012   ECG: sinus at 65 beats per minute -no ischemic change or arrhythmia     Assessment & Plan:   CPX/v70.0 - Patient has been counseled on age-appropriate routine health concerns for screening and prevention. These are reviewed and up-to-date. Immunizations are up-to-date or declined. Labs and ECG reviewed.  Also see problem list. Medications and labs reviewed today.  Time  spent with pt today 15 minutes, greater than 50% time spent counseling patient on diabetes, hypertension and medication review. [facility]

## 2013-03-11 NOTE — Assessment & Plan Note (Signed)
On prava and lopid - lipids done annually at [facility] The current medical regimen is effective;  continue present plan and medications.

## 2013-03-25 HISTORY — PX: COLONOSCOPY: SHX174

## 2013-03-28 ENCOUNTER — Other Ambulatory Visit: Payer: Self-pay | Admitting: Internal Medicine

## 2013-08-23 LAB — HM DIABETES EYE EXAM

## 2013-11-01 LAB — HM DIABETES FOOT EXAM

## 2013-11-01 LAB — BASIC METABOLIC PANEL
BUN: 24 mg/dL — AB (ref 4–21)
Creatinine: 1.3 mg/dL (ref 0.6–1.3)
Glucose: 135 mg/dL
Potassium: 4.9 mmol/L (ref 3.4–5.3)
Sodium: 140 mmol/L (ref 137–147)

## 2013-11-01 LAB — LIPID PANEL
Cholesterol: 207 mg/dL — AB (ref 0–200)
HDL: 51 mg/dL (ref 35–70)
LDL CALC: 129 mg/dL
Triglycerides: 107 mg/dL (ref 40–160)

## 2013-11-01 LAB — CBC AND DIFFERENTIAL
HCT: 35 % — AB (ref 41–53)
HEMOGLOBIN: 12.2 g/dL — AB (ref 13.5–17.5)
Platelets: 261 10*3/uL (ref 150–399)
WBC: 8.6 10*3/mL

## 2013-11-01 LAB — HEPATIC FUNCTION PANEL
ALK PHOS: 68 U/L (ref 25–125)
ALT: 15 U/L (ref 10–40)
AST: 17 U/L (ref 14–40)
BILIRUBIN, TOTAL: 0.4 mg/dL

## 2013-11-01 LAB — TSH: TSH: 2.9 u[IU]/mL (ref 0.41–5.90)

## 2013-11-01 LAB — HEMOGLOBIN A1C: Hgb A1c MFr Bld: 5.5 % (ref 4.0–6.0)

## 2013-11-09 ENCOUNTER — Encounter: Payer: Self-pay | Admitting: Internal Medicine

## 2013-11-09 ENCOUNTER — Ambulatory Visit (INDEPENDENT_AMBULATORY_CARE_PROVIDER_SITE_OTHER): Payer: Medicare Other | Admitting: Internal Medicine

## 2013-11-09 VITALS — BP 138/76 | HR 62 | Temp 97.4°F | Ht 68.0 in | Wt 199.5 lb

## 2013-11-09 DIAGNOSIS — I10 Essential (primary) hypertension: Secondary | ICD-10-CM

## 2013-11-09 DIAGNOSIS — G40909 Epilepsy, unspecified, not intractable, without status epilepticus: Secondary | ICD-10-CM

## 2013-11-09 DIAGNOSIS — E119 Type 2 diabetes mellitus without complications: Secondary | ICD-10-CM

## 2013-11-09 DIAGNOSIS — Z0000 Encounter for general adult medical examination without abnormal findings: Secondary | ICD-10-CM

## 2013-11-09 NOTE — Progress Notes (Signed)
Pre visit review using our clinic review tool, if applicable. No additional management support is needed unless otherwise documented below in the visit note. 

## 2013-11-09 NOTE — Patient Instructions (Addendum)
It was good to see you today.  We have reviewed your prior records including labs and tests today  Health Maintenance reviewed - all recommended immunizations and age-appropriate screenings are up-to-date.  Medications reviewed and updated, no changes recommended at this time.  Please schedule followup in 12 months for annual exam and labs, call sooner if problems.  Health Maintenance A healthy lifestyle and preventative care can promote health and wellness.  Maintain regular health, dental, and eye exams.  Eat a healthy diet. Foods like vegetables, fruits, whole grains, low-fat dairy products, and lean protein foods contain the nutrients you need and are low in calories. Decrease your intake of foods high in solid fats, added sugars, and salt. Get information about a proper diet from your health care provider, if necessary.  Regular physical exercise is one of the most important things you can do for your health. Most adults should get at least 150 minutes of moderate-intensity exercise (any activity that increases your heart rate and causes you to sweat) each week. In addition, most adults need muscle-strengthening exercises on 2 or more days a week.   Maintain a healthy weight. The body mass index (BMI) is a screening tool to identify possible weight problems. It provides an estimate of body fat based on height and weight. Your health care provider can find your BMI and can help you achieve or maintain a healthy weight. For males 20 years and older:  A BMI below 18.5 is considered underweight.  A BMI of 18.5 to 24.9 is normal.  A BMI of 25 to 29.9 is considered overweight.  A BMI of 30 and above is considered obese.  Maintain normal blood lipids and cholesterol by exercising and minimizing your intake of saturated fat. Eat a balanced diet with plenty of fruits and vegetables. Blood tests for lipids and cholesterol should begin at age 25 and be repeated every 5 years. If your lipid or  cholesterol levels are high, you are over age 79, or you are at high risk for heart disease, you may need your cholesterol levels checked more frequently.Ongoing high lipid and cholesterol levels should be treated with medicines if diet and exercise are not working.  If you smoke, find out from your health care provider how to quit. If you do not use tobacco, do not start.  Lung cancer screening is recommended for adults aged 26-80 years who are at high risk for developing lung cancer because of a history of smoking. A yearly low-dose CT scan of the lungs is recommended for people who have at least a 30-pack-year history of smoking and are current smokers or have quit within the past 15 years. A pack year of smoking is smoking an average of 1 pack of cigarettes a day for 1 year (for example, a 30-pack-year history of smoking could mean smoking 1 pack a day for 30 years or 2 packs a day for 15 years). Yearly screening should continue until the smoker has stopped smoking for at least 15 years. Yearly screening should be stopped for people who develop a health problem that would prevent them from having lung cancer treatment.  If you choose to drink alcohol, do not have more than 2 drinks per day. One drink is considered to be 12 oz (360 mL) of beer, 5 oz (150 mL) of wine, or 1.5 oz (45 mL) of liquor.  Avoid the use of street drugs. Do not share needles with anyone. Ask for help if you need support or  instructions about stopping the use of drugs.  High blood pressure causes heart disease and increases the risk of stroke. Blood pressure should be checked at least every 1-2 years. Ongoing high blood pressure should be treated with medicines if weight loss and exercise are not effective.  If you are 74-8 years old, ask your health care provider if you should take aspirin to prevent heart disease.  Diabetes screening involves taking a blood sample to check your fasting blood sugar level. This should be done  once every 3 years after age 26 if you are at a normal weight and without risk factors for diabetes. Testing should be considered at a younger age or be carried out more frequently if you are overweight and have at least 1 risk factor for diabetes.  Colorectal cancer can be detected and often prevented. Most routine colorectal cancer screening begins at the age of 76 and continues through age 85. However, your health care provider may recommend screening at an earlier age if you have risk factors for colon cancer. On a yearly basis, your health care provider may provide home test kits to check for hidden blood in the stool. A small camera at the end of a tube may be used to directly examine the colon (sigmoidoscopy or colonoscopy) to detect the earliest forms of colorectal cancer. Talk to your health care provider about this at age 62 when routine screening begins. A direct exam of the colon should be repeated every 5-10 years through age 35, unless early forms of precancerous polyps or small growths are found.  People who are at an increased risk for hepatitis B should be screened for this virus. You are considered at high risk for hepatitis B if:  You were born in a country where hepatitis B occurs often. Talk with your health care provider about which countries are considered high risk.  Your parents were born in a high-risk country and you have not received a shot to protect against hepatitis B (hepatitis B vaccine).  You have HIV or AIDS.  You use needles to inject street drugs.  You live with, or have sex with, someone who has hepatitis B.  You are a man who has sex with other men (MSM).  You get hemodialysis treatment.  You take certain medicines for conditions like cancer, organ transplantation, and autoimmune conditions.  Hepatitis C blood testing is recommended for all people born from [birth year] through 1963 and any individual with known risk factors for hepatitis C.  Healthy men should  no longer receive prostate-specific antigen (PSA) blood tests as part of routine cancer screening. Talk to your health care provider about prostate cancer screening.  Testicular cancer screening is not recommended for adolescents or adult males who have no symptoms. Screening includes self-exam, a health care provider exam, and other screening tests. Consult with your health care provider about any symptoms you have or any concerns you have about testicular cancer.  Practice safe sex. Use condoms and avoid high-risk sexual practices to reduce the spread of sexually transmitted infections (STIs).  You should be screened for STIs, including gonorrhea and chlamydia if:  You are sexually active and are younger than 24 years.  You are older than 24 years, and your health care provider tells you that you are at risk for this type of infection.  Your sexual activity has changed since you were last screened, and you are at an increased risk for chlamydia or gonorrhea. Ask your health care provider if  you are at risk.  If you are at risk of being infected with HIV, it is recommended that you take a prescription medicine daily to prevent HIV infection. This is called pre-exposure prophylaxis (PrEP). You are considered at risk if:  You are a man who has sex with other men (MSM).  You are a heterosexual man who is sexually active with multiple partners.  You take drugs by injection.  You are sexually active with a partner who has HIV.  Talk with your health care provider about whether you are at high risk of being infected with HIV. If you choose to begin PrEP, you should first be tested for HIV. You should then be tested every 3 months for as long as you are taking PrEP.  Use sunscreen. Apply sunscreen liberally and repeatedly throughout the day. You should seek shade when your shadow is shorter than you. Protect yourself by wearing long sleeves, pants, a wide-brimmed hat, and sunglasses year round  whenever you are outdoors.  Tell your health care provider of new moles or changes in moles, especially if there is a change in shape or color. Also, tell your health care provider if a mole is larger than the size of a pencil eraser.  A one-time screening for abdominal aortic aneurysm (AAA) and surgical repair of large AAAs by ultrasound is recommended for men aged 44-75 years who are current or former smokers.  Stay current with your vaccines (immunizations). Document Released: 09/07/2007 Document Revised: 03/16/2013 Document Reviewed: 08/06/2010 [facility] Patient Information 2015 [place], [place]. This information is not intended to replace advice given to you by your health care provider. Make sure you discuss any questions you have with your health care provider.

## 2013-11-09 NOTE — Assessment & Plan Note (Signed)
BP Readings from Last 3 Encounters:  11/09/13 138/76  03/11/13 120/72  12/08/12 118/65   The current medical regimen is effective;  continue present plan and medications.

## 2013-11-09 NOTE — Assessment & Plan Note (Signed)
No breakthru events since 2011 event Follows annually with neuro on same - On dilantin - levels from [place] reviewed The current medical regimen is effective;  continue present plan and medications.

## 2013-11-09 NOTE — Assessment & Plan Note (Signed)
The current medical regimen is effective;  continue present plan and medications. Lab Results  Component Value Date   HGBA1C 5.5 11/01/2013

## 2013-11-09 NOTE — Progress Notes (Signed)
Subjective:    Patient ID: Andrew Jones, male    DOB: 06-Nov-1940, 73 y.o.   MRN: [id number]  HPI   Here for medicare wellness and annual physical exam  Diet: heart healthy, diabetic Physical activity: sedentary Depression/mood screen: negative Hearing: intact to whispered voice Visual acuity: grossly normal, performs annual eye exam  ADLs: capable Fall risk: none Home safety: good Cognitive evaluation: intact to orientation, naming, recall and repetition EOL planning: adv directives, full code/ I agree  I have personally reviewed and have noted 1. The patient's medical and social history 2. Their use of alcohol, tobacco or illicit drugs 3. Their current medications and supplements 4. The patient's functional ability including ADL's, fall risks, home safety risks and hearing or visual impairment. 5. Diet and physical activities 6. Evidence for depression or mood disorders  Also reviewed chronic medical issues and interval medical events  Past Medical History  Diagnosis Date  . Diabetes mellitus, type 2   . Seizure disorder 05/2009  . Hypertension   . Dyslipidemia   . CVA (cerebral vascular accident) 1999, 2002    no residual problems  . Osteoarthritis   . Ankylosing spondylitis     back, hips - on embrel 2000-2008, resumed 12/2012  . Sleep apnea     uses c-pap   Family History  Problem Relation Age of Onset  . Heart failure Mother   . Heart disease Mother   . COPD Father   . Prostate cancer Brother    History  Substance Use Topics  . Smoking status: Former Research scientist (life sciences)  . Smokeless tobacco: Never Used  . Alcohol Use: 3.6 oz/week    6 Cans of beer per week     Comment: weekly     Review of Systems  Constitutional: Negative for fever, activity change, appetite change, fatigue and unexpected weight change.  Respiratory: Negative for cough, chest tightness, shortness of breath and wheezing.   Cardiovascular: Negative for chest pain, palpitations and leg  swelling.  Neurological: Negative for dizziness, weakness and headaches.  Psychiatric/Behavioral: Negative for dysphoric mood. The patient is not nervous/anxious.   All other systems reviewed and are negative.      Objective:   Physical Exam  BP 138/76  Pulse 62  Temp(Src) 97.4 F (36.3 C) (Oral)  Ht 5\' 8"  (1.727 m)  Wt 199 lb 8 oz (90.493 kg)  BMI 30.34 kg/m2  SpO2 97% Wt Readings from Last 3 Encounters:  11/09/13 199 lb 8 oz (90.493 kg)  03/11/13 192 lb 1.9 oz (87.145 kg)  12/08/12 197 lb (89.359 kg)   Constitutional: he is overweight, and appears well-developed and well-nourished. No distress.  HENT: Head: Normocephalic and atraumatic. Ears: B remains in place, once removed B TMs ok, no erythema or effusion; Nose: Nose normal. Mouth/Throat: Oropharynx is clear and moist. No oropharyngeal exudate.  Eyes: wears corrective lenses. Conjunctivae and EOM are normal. Pupils are equal, round, and reactive to light. No scleral icterus.  Neck: Normal range of motion. Neck supple. No JVD present. No thyromegaly present.  Cardiovascular: Normal rate, regular rhythm and normal heart sounds.  No murmur heard. No BLE edema. Pulmonary/Chest: Effort normal and breath sounds normal. No respiratory distress. he has no wheezes.  Abdominal: Soft. Bowel sounds are normal. he exhibits no distension. There is no tenderness. no masses GU: defer Musculoskeletal: Normal range of motion, no joint effusions. No gross deformities Neurological: he is alert and oriented to person, place, and time. No cranial nerve deficit. Coordination, balance,  strength, speech and gait are normal.  Skin: Skin is warm and dry. No rash noted. No erythema.  Psychiatric: he has a normal mood and affect. behavior is normal. Judgment and thought content normal.   Lab Results  Component Value Date   WBC 8.6 11/01/2013   HGB 12.2* 11/01/2013   HCT 35* 11/01/2013   PLT 261 11/01/2013   GLUCOSE 158* 06/11/2009   CHOL 207*  11/01/2013   TRIG 107 11/01/2013   HDL 51 11/01/2013   LDLCALC 129 11/01/2013   ALT 15 11/01/2013   AST 17 11/01/2013   NA 140 11/01/2013   K 4.9 11/01/2013   CL 106 06/11/2009   CREATININE 1.3 11/01/2013   BUN 24* 11/01/2013   CO2 21 06/11/2009   TSH 2.90 11/01/2013   INR 1.13 06/10/2009   HGBA1C 5.5 11/01/2013    No results found.     Assessment & Plan:   CPX/AWV/v70.0 - Today patient counseled on age appropriate routine health concerns for screening and prevention, each reviewed and up to date or declined. Immunizations reviewed and up to date or declined. Labs reviewed. Risk factors for depression reviewed and negative. Hearing function and visual acuity are intact. ADLs screened and addressed as needed. Functional ability and level of safety reviewed and appropriate. Education, counseling and referrals performed based on assessed risks today. Patient provided with a copy of personalized plan for preventive services.  Problem List Items Addressed This Visit   Diabetes mellitus, type 2      The current medical regimen is effective;  continue present plan and medications. Lab Results  Component Value Date   HGBA1C 5.5 11/01/2013      Hypertension      BP Readings from Last 3 Encounters:  11/09/13 138/76  03/11/13 120/72  12/08/12 118/65   The current medical regimen is effective;  continue present plan and medications.    Seizure disorder     No breakthru events since 2011 event Follows annually with neuro on same - On dilantin - levels from [place] reviewed The current medical regimen is effective;  continue present plan and medications.      Other Visit Diagnoses   Routine general medical examination at a health care facility    -  Primary

## 2013-11-10 ENCOUNTER — Telehealth: Payer: Self-pay | Admitting: Internal Medicine

## 2013-11-10 NOTE — Telephone Encounter (Signed)
Relevant patient education assigned to patient using Emmi. ° °

## 2013-12-08 ENCOUNTER — Encounter: Payer: Self-pay | Admitting: Nurse Practitioner

## 2013-12-08 ENCOUNTER — Ambulatory Visit (INDEPENDENT_AMBULATORY_CARE_PROVIDER_SITE_OTHER): Payer: Medicare Other | Admitting: Nurse Practitioner

## 2013-12-08 VITALS — BP 143/74 | HR 68 | Ht 69.0 in | Wt 197.4 lb

## 2013-12-08 DIAGNOSIS — G40909 Epilepsy, unspecified, not intractable, without status epilepticus: Secondary | ICD-10-CM

## 2013-12-08 NOTE — Progress Notes (Signed)
[facility]  PATIENT: Andrew Jones DOB: 01/29/1941   REASON FOR VISIT: Followup for seizure disorder   HISTORY OF PRESENT ILLNESS:Andrew Jones, 73 year male who had generalized seizure in March 2011, 2 episodes at that time. He has has normal MRI and EEG. No prior history of seizures.  He is on  476m Dilantin daily. He is here today for follow up since his last visit on 12/08/12. He has been tolerating Dilantin 4053mdaily without nausea, dizziness or tremors. He has not had any seizure activity. No new neurologic complaints. He changed his Dilantin to generic. [place] [facility] [place] [place] [facility] [facility] [facility]. Last seizure March 2011. He returns for reevaluation.  REVIEW OF SYSTEMS: Full 14 system review of systems performed and notable only for those listed, all others are neg:  Constitutional: N/A  Cardiovascular: N/A  Ear/Nose/Throat: N/A  Skin: N/A  Eyes: N/A  Respiratory: N/A  Gastroitestinal: N/A  Hematology/Lymphatic: N/A  Endocrine: N/A Musculoskeletal:N/A  Allergy/Immunology: N/A  Neurological: N/A Psychiatric: N/A Sleep : NA   ALLERGIES: No Known Allergies  HOME MEDICATIONS: Outpatient Prescriptions Prior to Visit  Medication Sig Dispense Refill  . amLODipine (NORVASC) 5 MG tablet Take 5 mg by mouth daily.      . Marland Kitchenspirin 81 MG tablet Take 81 mg by mouth daily.      . benazepril (LOTENSIN) 10 MG tablet Take 10 mg by mouth daily.      . cholecalciferol (VITAMIN D) 1000 UNITS tablet Take 1,000 Units by mouth daily.      . diclofenac (VOLTAREN) 75 MG EC tablet Take 1 tablet (75 mg total) by mouth daily.  30 tablet  5  . etanercept (ENBREL) 50 MG/ML injection Inject 50 mg into the skin once a week.      . fish oil-omega-3 fatty acids 1000 MG capsule Take 2 g by mouth daily.      . Marland Kitchenemfibrozil (LOPID) 600 MG tablet Take 600 mg by mouth 2 (two) times daily before a  meal.      . metFORMIN (GLUCOPHAGE) 1000 MG tablet Take 1,000 mg by mouth 2 (two) times daily with a meal.      . metoprolol tartrate (LOPRESSOR) 25 MG tablet Take 25 mg by mouth 2 (two) times daily.      . Multiple Vitamin (MULTIVITAMIN) tablet Take 1 tablet by mouth daily.      . Marland Kitchenmeprazole (PRILOSEC) 20 MG capsule Take 20 mg by mouth daily.      . phenytoin (DILANTIN) 100 MG ER capsule Take 100 mg by mouth 4 (four) times daily - after meals and at bedtime.      . pioglitazone (ACTOS) 30 MG tablet TAKE 1 TABLET BY MOUTH DAILY  90 tablet  3  . pravastatin (PRAVACHOL) 10 MG tablet Take 10 mg by mouth daily.       No facility-administered medications prior to visit.    PAST MEDICAL HISTORY: Past Medical History  Diagnosis Date  . Diabetes mellitus, type 2   . Seizure disorder 05/2009  . Hypertension   . Dyslipidemia   . CVA (cerebral vascular accident) 1999, 2002    no residual problems  . Osteoarthritis   . Ankylosing spondylitis     back, hips - on embrel 2000-2008, resumed 12/2012  . Sleep apnea     uses c-pap    PAST SURGICAL HISTORY: Past Surgical  History  Procedure Laterality Date  . No past surgeries      FAMILY HISTORY: Family History  Problem Relation Age of Onset  . Heart failure Mother   . Heart disease Mother   . COPD Father   . Prostate cancer Brother     SOCIAL HISTORY: History   Social History  . Marital Status: Married    Spouse Name: N/A    Number of Children: 1  . Years of Education: 13   Occupational History  . Not on file.   Social History Main Topics  . Smoking status: Former Research scientist (life sciences)  . Smokeless tobacco: Never Used  . Alcohol Use: 3.6 oz/week    6 Cans of beer per week     Comment: weekly   . Drug Use: No  . Sexual Activity: Not on file   Other Topics Concern  . Not on file   Social History Narrative   Patient is married with one son.   Patient is right handed.   Patient has 13 yrs of education.   Patient drinks 3 cups daily.       PHYSICAL EXAM  Filed Vitals:   12/08/13 0827  BP: 143/74  Pulse: 68  Height: _0  (1.753 m)  Weight: 197 lb 6.4 oz (89.54 kg)   Body mass index is 29.14 kg/(m^2). Generalized: Well developed, in no acute distress  Head: normocephalic and atraumatic,. Oropharynx benign  Neck: Supple, no carotid bruits   Neurological examination  Mentation: Alert oriented to time, place, history taking. Follows all commands speech and language fluent  Cranial nerve II-XII: Pupils were equal round reactive to light extraocular movements were full, visual fields were full on confrontational test. Facial sensation and strength were normal. hearing was intact to finger rubbing bilaterally. Uvula tongue midline. head turning and shoulder shrug and were normal and symmetric.Tongue protrusion into cheek strength was normal.  Motor: normal bulk and tone, full strength in the BUE, BLE, fine finger movements normal, no pronator drift. No focal weakness  Sensory: normal and symmetric to light touch, pinprick, and vibration  Coordination: finger-nose-finger, heel-to-shin bilaterally, no dysmetria  Reflexes: Brachioradialis 2/2, biceps 2/2, triceps 2/2, patellar 2/2, Achilles 2/2, plantar responses were flexor bilaterally.  Gait and Station: Rising up from seated position without assistance, normal stance, moderate stride, good arm swing, smooth turning, able to perform tiptoe, and heel walking without difficulty. Steady tandem gait     DIAGNOSTIC DATA (LABS, IMAGING, TESTING) - I reviewed patient records, labs, notes, testing and imaging myself where available.  Lab Results  Component Value Date   WBC 8.6 11/01/2013   HGB 12.2* 11/01/2013   HCT 35* 11/01/2013   MCV 87.9 06/11/2009   PLT 261 11/01/2013      Component Value Date/Time   NA 140 11/01/2013   NA 139 06/11/2009 0335   K 4.9 11/01/2013   CL 106 06/11/2009 0335   CO2 21 06/11/2009 0335   GLUCOSE 158* 06/11/2009 0335   BUN 24* 11/01/2013   BUN 15  DELTA CHECK NOTED 06/11/2009 0335   CREATININE 1.3 11/01/2013   CREATININE 1.00 06/11/2009 0335   CALCIUM 8.8 06/11/2009 0335   PROT 6.5 06/10/2009 1710   ALBUMIN 3.7 06/10/2009 1710   AST 17 11/01/2013   ALT 15 11/01/2013   ALKPHOS 68 11/01/2013   BILITOT 0.7 06/10/2009 1710   GFRNONAA >60 06/11/2009 0335   GFRAA  Value: >60        The eGFR has been calculated using the  MDRD equation. This calculation has not been validated in all clinical situations. eGFR's persistently <60 mL/min signify possible Chronic Kidney Disease. 06/11/2009 0335   Lab Results  Component Value Date   CHOL 207* 11/01/2013   HDL 51 11/01/2013   LDLCALC 129 11/01/2013   TRIG 107 11/01/2013   Lab Results  Component Value Date   HGBA1C 5.5 11/01/2013    Lab Results  Component Value Date   TSH 2.90 11/01/2013      ASSESSMENT AND PLAN  73 y.o. year old male  has a past medical history of Diabetes mellitus, type 2; Seizure disorder (05/2009); Hypertension; Dyslipidemia; CVA (cerebral vascular accident) (1999, 2002); Osteoarthritis; Ankylosing spondylitis; and Sleep apnea. here to follow up last seizure 2011.   Continue Dilantin at 425m daily, [place] [facility] [address] [place] [phone]

## 2013-12-08 NOTE — Patient Instructions (Signed)
Continue Dilantin at 400mg  daily Labs reviewed from [place] F/U yearly and prn

## 2013-12-10 NOTE — Progress Notes (Signed)
I agree with the above plan 

## 2014-07-31 ENCOUNTER — Other Ambulatory Visit: Payer: Self-pay | Admitting: Internal Medicine

## 2014-08-16 LAB — HM DIABETES EYE EXAM

## 2014-09-08 ENCOUNTER — Encounter: Payer: Self-pay | Admitting: Internal Medicine

## 2014-11-11 LAB — CBC AND DIFFERENTIAL
HCT: 37 % — AB (ref 41–53)
Hemoglobin: 12.6 g/dL — AB (ref 13.5–17.5)
Platelets: 227 10*3/uL (ref 150–399)
WBC: 8.2 10*3/mL

## 2014-11-11 LAB — HEMOGLOBIN A1C: Hgb A1c MFr Bld: 5.4 % (ref 4.0–6.0)

## 2014-11-11 LAB — PSA: PSA: 4.58

## 2014-12-07 ENCOUNTER — Encounter: Payer: Self-pay | Admitting: Nurse Practitioner

## 2014-12-07 ENCOUNTER — Ambulatory Visit (INDEPENDENT_AMBULATORY_CARE_PROVIDER_SITE_OTHER): Payer: Medicare Other | Admitting: Nurse Practitioner

## 2014-12-07 VITALS — BP 151/73 | HR 66 | Ht 68.0 in | Wt 196.8 lb

## 2014-12-07 DIAGNOSIS — Z5181 Encounter for therapeutic drug level monitoring: Secondary | ICD-10-CM

## 2014-12-07 DIAGNOSIS — G40909 Epilepsy, unspecified, not intractable, without status epilepticus: Secondary | ICD-10-CM

## 2014-12-07 NOTE — Progress Notes (Signed)
[facility]  PATIENT: Andrew Jones DOB: [birth year]   REASON FOR VISIT: Follow-up for seizure disorder HISTORY FROM: Patient    HISTORY OF PRESENT ILLNESS:Andrew Jones, 74 year male returns for follow-up. He had generalized seizure in March 2011, 2 episodes at that time. He has has normal MRI and EEG. No prior history of seizures. He is on 400mg  Dilantin daily. He is here today for follow up since his last visit on 12/08/13. He has been tolerating Dilantin 400mg  daily without nausea, dizziness or tremors. He has not had any seizure activity. No new neurologic complaints.  He gets his medications from the [place].  No falls, recent labs done at the [place], I do not have a copy of recent Dilantin level the patient claims it was within therapeutic limits  Last seizure March 2011. He returns for reevaluation.    REVIEW OF SYSTEMS: Full 14 system review of systems performed and notable only for those listed, all others are neg:  Constitutional: neg  Cardiovascular: neg Ear/Nose/Throat: neg  Skin: neg Eyes: neg Respiratory: neg Gastroitestinal: neg  Hematology/Lymphatic: neg  Endocrine: neg Musculoskeletal:neg Allergy/Immunology: neg Neurological: neg Psychiatric: neg Sleep : neg   ALLERGIES: No Known Allergies  HOME MEDICATIONS: Outpatient Prescriptions Prior to Visit  Medication Sig Dispense Refill  . amLODipine (NORVASC) 5 MG tablet Take 5 mg by mouth daily.    Marland Kitchen aspirin 81 MG tablet Take 81 mg by mouth daily.    . benazepril (LOTENSIN) 10 MG tablet Take 10 mg by mouth daily.    . cholecalciferol (VITAMIN D) 1000 UNITS tablet Take 1,000 Units by mouth daily.    . diclofenac (VOLTAREN) 75 MG EC tablet Take 1 tablet (75 mg total) by mouth daily. 30 tablet 5  . etanercept (ENBREL) 50 MG/ML injection Inject 50 mg into the skin once a week.    . fish oil-omega-3 fatty acids 1000 MG capsule Take 2 g by mouth daily.    Marland Kitchen gemfibrozil (LOPID) 600 MG tablet Take 600 mg by  mouth 2 (two) times daily before a meal.    . metFORMIN (GLUCOPHAGE) 1000 MG tablet Take 1,000 mg by mouth 2 (two) times daily with a meal.    . metoprolol tartrate (LOPRESSOR) 25 MG tablet Take 25 mg by mouth 2 (two) times daily.    . Multiple Vitamin (MULTIVITAMIN) tablet Take 1 tablet by mouth daily.    Marland Kitchen omeprazole (PRILOSEC) 20 MG capsule Take 20 mg by mouth daily.    . phenytoin (DILANTIN) 100 MG ER capsule Take 100 mg by mouth 4 (four) times daily - after meals and at bedtime.    . pioglitazone (ACTOS) 30 MG tablet Take 1 tablet (30 mg total) by mouth daily. 90 tablet 1  . pravastatin (PRAVACHOL) 10 MG tablet Take 10 mg by mouth daily.     No facility-administered medications prior to visit.    PAST MEDICAL HISTORY: Past Medical History  Diagnosis Date  . Diabetes mellitus, type 2   . Seizure disorder 05/2009  . Hypertension   . Dyslipidemia   . CVA (cerebral vascular accident) 1999, 2002    no residual problems  . Osteoarthritis   . Ankylosing spondylitis     back, hips - on embrel 2000-2008, resumed 12/2012  . Sleep apnea     uses c-pap    PAST SURGICAL HISTORY: Past Surgical History  Procedure Laterality Date  . No past surgeries      FAMILY HISTORY: Family History  Problem  Relation Age of Onset  . Heart failure Mother   . Heart disease Mother   . COPD Father   . Prostate cancer Brother     SOCIAL HISTORY: Social History   Social History  . Marital Status: Married    Spouse Name: N/A  . Number of Children: 1  . Years of Education: 13   Occupational History  . Not on file.   Social History Main Topics  . Smoking status: Former Research scientist (life sciences)  . Smokeless tobacco: Never Used  . Alcohol Use: 3.6 oz/week    6 Cans of beer per week     Comment: weekly   . Drug Use: No  . Sexual Activity: Not on file   Other Topics Concern  . Not on file   Social History Narrative   Patient is married with one son.   Patient is right handed.   Patient has 13 yrs of  education.   Patient drinks 3 cups daily.     PHYSICAL EXAM  Filed Vitals:   12/07/14 0825  BP: 151/73  Pulse: 66  Height: 5\' 8"  (1.727 m)  Weight: 196 lb 12.8 oz (89.268 kg)   Body mass index is 29.93 kg/(m^2). Generalized: Well developed, in no acute distress  Head: normocephalic and atraumatic,. Oropharynx benign  Neck: Supple, no carotid bruits   Neurological examination  Mentation: Alert oriented to time, place, history taking. Follows all commands speech and language fluent  Cranial nerve II-XII: Pupils were equal round reactive to light extraocular movements were full, visual fields were full on confrontational test. Facial sensation and strength were normal. hearing was intact to finger rubbing bilaterally. Uvula tongue midline. head turning and shoulder shrug and were normal and symmetric.Tongue protrusion into cheek strength was normal.  Motor: normal bulk and tone, full strength in the BUE, BLE, fine finger movements normal, no pronator drift. No focal weakness  Sensory: normal and symmetric to light touch, pinprick, and vibration  Coordination: finger-nose-finger, heel-to-shin bilaterally, no dysmetria  Reflexes: Brachioradialis 2/2, biceps 2/2, triceps 2/2, patellar 2/2, Achilles 2/2, plantar responses were flexor bilaterally.  Gait and Station: Rising up from seated position without assistance, normal stance, moderate stride, good arm swing, smooth turning, able to perform tiptoe, and heel walking without difficulty. Steady tandem gait    DIAGNOSTIC DATA (LABS, IMAGING, TESTING) -   ASSESSMENT AND PLAN  74 y.o. year old male  has a past medical history of  Seizure disorder (05/2009); here to follow-up . Last seizure activity March 2011    Continue Dilantin at 400mg  daily, meds from the [facility] through [place]  F/U yearly and prn [name], [facility], [facility], [facility] [address], [place], [place] [address] [phone]

## 2014-12-07 NOTE — Patient Instructions (Signed)
Continue Dilantin at 400mg  daily, meds from the [place] through [place] F/U yearly and PRN

## 2014-12-08 NOTE — Progress Notes (Signed)
I agree with the above plan 

## 2014-12-20 ENCOUNTER — Ambulatory Visit (INDEPENDENT_AMBULATORY_CARE_PROVIDER_SITE_OTHER): Payer: Medicare Other | Admitting: Internal Medicine

## 2014-12-20 ENCOUNTER — Encounter: Payer: Self-pay | Admitting: Internal Medicine

## 2014-12-20 VITALS — BP 144/74 | HR 60 | Temp 97.5°F | Ht 68.0 in | Wt 196.0 lb

## 2014-12-20 DIAGNOSIS — M459 Ankylosing spondylitis of unspecified sites in spine: Secondary | ICD-10-CM | POA: Insufficient documentation

## 2014-12-20 DIAGNOSIS — E785 Hyperlipidemia, unspecified: Secondary | ICD-10-CM

## 2014-12-20 DIAGNOSIS — I10 Essential (primary) hypertension: Secondary | ICD-10-CM

## 2014-12-20 DIAGNOSIS — N400 Enlarged prostate without lower urinary tract symptoms: Secondary | ICD-10-CM

## 2014-12-20 DIAGNOSIS — Z0000 Encounter for general adult medical examination without abnormal findings: Secondary | ICD-10-CM | POA: Diagnosis not present

## 2014-12-20 DIAGNOSIS — M456 Ankylosing spondylitis lumbar region: Secondary | ICD-10-CM | POA: Insufficient documentation

## 2014-12-20 DIAGNOSIS — E1159 Type 2 diabetes mellitus with other circulatory complications: Secondary | ICD-10-CM | POA: Diagnosis not present

## 2014-12-20 DIAGNOSIS — Z23 Encounter for immunization: Secondary | ICD-10-CM

## 2014-12-20 DIAGNOSIS — I639 Cerebral infarction, unspecified: Secondary | ICD-10-CM

## 2014-12-20 DIAGNOSIS — R972 Elevated prostate specific antigen [PSA]: Secondary | ICD-10-CM

## 2014-12-20 NOTE — Progress Notes (Signed)
Subjective:  Patient ID: Andrew Jones, male    DOB: 07/04/1940  Age: 74 y.o. MRN: [id number]  CC: Hyperlipidemia; Hypertension; Diabetes; and Annual Exam   HPI BOWE SIDOR presents for a physical. He is new to me. He was recently seen at the [place] and had his lab work done. He was found to have a slightly elevated PSA. [facility] will [facility] him to urology if it remains elevated. He is not willing to undress today for a thorough exam as he tells me it was just done at the [place]. He has chronic low back pain from ankylosing spondylitis but offers no other complaints today.  History Kingson has a past medical history of Diabetes mellitus, type 2; Seizure disorder (05/2009); Hypertension; Dyslipidemia; CVA (cerebral vascular accident) (1999, 2002); Osteoarthritis; Ankylosing spondylitis; and Sleep apnea.   He has past surgical history that includes No past surgeries.   His family history includes COPD in his father; Heart disease in his mother; Heart failure in his mother; Prostate cancer in his brother.He reports that he has quit smoking. He has never used smokeless tobacco. He reports that he drinks about 3.6 oz of alcohol per week. He reports that he does not use illicit drugs.  Outpatient Prescriptions Prior to Visit  Medication Sig Dispense Refill  . amLODipine (NORVASC) 5 MG tablet Take 5 mg by mouth daily.    Marland Kitchen aspirin 81 MG tablet Take 81 mg by mouth daily.    . benazepril (LOTENSIN) 10 MG tablet Take 10 mg by mouth daily.    . cholecalciferol (VITAMIN D) 1000 UNITS tablet Take 1,000 Units by mouth daily.    . diclofenac (VOLTAREN) 75 MG EC tablet Take 1 tablet (75 mg total) by mouth daily. 30 tablet 5  . etanercept (ENBREL) 50 MG/ML injection Inject 50 mg into the skin once a week.    . fish oil-omega-3 fatty acids 1000 MG capsule Take 2 g by mouth daily.    Marland Kitchen gemfibrozil (LOPID) 600 MG tablet Take 600 mg by mouth 2 (two) times daily before a meal.    .  metFORMIN (GLUCOPHAGE) 1000 MG tablet Take 1,000 mg by mouth 2 (two) times daily with a meal.    . metoprolol tartrate (LOPRESSOR) 25 MG tablet Take 25 mg by mouth 2 (two) times daily.    . Multiple Vitamin (MULTIVITAMIN) tablet Take 1 tablet by mouth daily.    Marland Kitchen omeprazole (PRILOSEC) 20 MG capsule Take 20 mg by mouth daily.    . phenytoin (DILANTIN) 100 MG ER capsule Take 100 mg by mouth 4 (four) times daily - after meals and at bedtime.    . pravastatin (PRAVACHOL) 10 MG tablet Take 10 mg by mouth daily.    . pioglitazone (ACTOS) 30 MG tablet Take 1 tablet (30 mg total) by mouth daily. 90 tablet 1   No facility-administered medications prior to visit.    ROS Review of Systems  Constitutional: Negative.  Negative for fever, chills, diaphoresis, appetite change, fatigue and unexpected weight change.  HENT: Negative.   Eyes: Negative.   Respiratory: Negative.  Negative for cough, choking, chest tightness, shortness of breath and stridor.   Cardiovascular: Negative.  Negative for chest pain and leg swelling.  Gastrointestinal: Negative.  Negative for nausea, vomiting, abdominal pain, constipation and blood in stool.  Endocrine: Negative.  Negative for polydipsia, polyphagia and polyuria.  Genitourinary: Negative.  Negative for dysuria, urgency, frequency, hematuria, flank pain, decreased urine  volume and difficulty urinating.  Musculoskeletal: Positive for back pain. Negative for myalgias, joint swelling, arthralgias, gait problem, neck pain and neck stiffness.  Skin: Negative.   Allergic/Immunologic: Negative.   Neurological: Negative.  Negative for dizziness, tremors, weakness, light-headedness and numbness.  Hematological: Negative.  Negative for adenopathy. Does not bruise/bleed easily.  Psychiatric/Behavioral: Negative.     Objective:  BP 144/74 mmHg  Pulse 60  Temp(Src) 97.5 F (36.4 C) (Oral)  Ht 5' 8"  (1.727 m)  Wt 196 lb (88.905 kg)  BMI 29.81 kg/m2  SpO2 98%  Physical  Exam  Constitutional: He is oriented to person, place, and time. No distress.  HENT:  Head: Normocephalic and atraumatic.  Mouth/Throat: Oropharynx is clear and moist. No oropharyngeal exudate.  Eyes: Conjunctivae are normal. Right eye exhibits no discharge. Left eye exhibits no discharge. No scleral icterus.  Neck: Normal range of motion. Neck supple. No JVD present. No tracheal deviation present. No thyromegaly present.  Cardiovascular: Normal rate, regular rhythm, normal heart sounds and intact distal pulses.  Exam reveals no gallop and no friction rub.   No murmur heard. Pulmonary/Chest: Effort normal and breath sounds normal. No respiratory distress. He has no wheezes. He has no rales. He exhibits no tenderness.  Abdominal: Soft. Bowel sounds are normal. He exhibits no distension and no mass. There is no tenderness. There is no rebound and no guarding.  Genitourinary:  GU and rectal exams were not done at his request.  Musculoskeletal: Normal range of motion. He exhibits no edema or tenderness.  Lymphadenopathy:    He has no cervical adenopathy.  Neurological: He is oriented to person, place, and time.  Skin: Skin is warm and dry. No rash noted. He is not diaphoretic. No erythema. No pallor.  Psychiatric: He has a normal mood and affect. His behavior is normal. Judgment and thought content normal.  Vitals reviewed.   Lab Results  Component Value Date   WBC 8.6 11/01/2013   HGB 12.2* 11/01/2013   HCT 35* 11/01/2013   PLT 261 11/01/2013   GLUCOSE 158* 06/11/2009   CHOL 207* 11/01/2013   TRIG 107 11/01/2013   HDL 51 11/01/2013   LDLCALC 129 11/01/2013   ALT 15 11/01/2013   AST 17 11/01/2013   NA 140 11/01/2013   K 4.9 11/01/2013   CL 106 06/11/2009   CREATININE 1.3 11/01/2013   BUN 24* 11/01/2013   CO2 21 06/11/2009   TSH 2.90 11/01/2013   INR 1.13 06/10/2009   HGBA1C 5.5 11/01/2013    Assessment & Plan:   Rocko was seen today for hyperlipidemia, hypertension,  diabetes and annual exam.  Diagnoses and all orders for this visit:  Type 2 diabetes mellitus with other circulatory complications- his recent A1c was below 6% so I've asked him to stop taking Actos. I will recheck his A1c today. For now he will continue taking metformin. -     Lipid panel; Future -     Microalbumin / creatinine urine ratio; Future -     Hemoglobin A1c; Future -     CBC with Differential/Platelet; Future -     Comprehensive metabolic panel; Future  Hyperlipidemia with target LDL less than 70- he is doing well on the statin but is due for his fasting lipid panel. If he has not met his LDL goal but I will consider adding Zetia for additional LDL reduction. -     Lipid panel; Future -     TSH; Future -  Urinalysis, Routine w reflex microscopic (not at [facility]); Future -     Comprehensive metabolic panel; Future  Essential hypertension- his blood pressure is well controlled, I will monitor his electrolytes and renal function today. -     Comprehensive metabolic panel; Future  CVA (cerebral vascular accident)- no sequela are noted as a result of this.  BPH (benign prostatic hypertrophy)- the prostate was not examined today at his request. He does not have any symptoms of work for prior treatment. I will recheck his PSA and he will continue to follow[facility] at the [facility]. -     PSA; Future -     Urinalysis, Routine w reflex microscopic (not at [facility]); Future  Ankylosing spondylitis of lumbar region  Need for influenza vaccination -     Flu vaccine HIGH DOSE PF  PSA elevation   I have discontinued Mr. Balderston's pioglitazone. I am also having him maintain his phenytoin, metFORMIN, pravastatin, cholecalciferol, metoprolol tartrate, benazepril, aspirin, multivitamin, fish oil-omega-3 fatty acids, gemfibrozil, amLODipine, diclofenac, omeprazole, and etanercept.  No orders of the defined types were placed in this encounter.     Follow-up: Return in about 6 months (around  06/19/2015).  Scarlette Calico, MD

## 2014-12-20 NOTE — Patient Instructions (Signed)

## 2014-12-20 NOTE — Assessment & Plan Note (Signed)

## 2014-12-20 NOTE — Progress Notes (Signed)
Pre visit review using our clinic review tool, if applicable. No additional management support is needed unless otherwise documented below in the visit note. 

## 2015-04-05 LAB — CBC AND DIFFERENTIAL
HEMATOCRIT: 33 — AB (ref 41–53)
HEMOGLOBIN: 11.5 — AB (ref 13.5–17.5)
PLATELETS: 284 (ref 150–399)
WBC: 7.8

## 2015-04-05 LAB — HEPATIC FUNCTION PANEL
ALT: 20 (ref 10–40)
AST: 15 (ref 14–40)
BILIRUBIN DIRECT: 0.1 (ref 0.01–0.4)
Bilirubin, Total: 0.5

## 2015-04-05 LAB — BASIC METABOLIC PANEL: Creatinine: 1.2 (ref 0.6–1.3)

## 2015-04-05 LAB — VITAMIN D 25 HYDROXY (VIT D DEFICIENCY, FRACTURES): VIT D 25 HYDROXY: 39.47

## 2015-11-21 ENCOUNTER — Encounter: Payer: Self-pay | Admitting: Student

## 2015-12-06 ENCOUNTER — Encounter: Payer: Self-pay | Admitting: Nurse Practitioner

## 2015-12-06 ENCOUNTER — Ambulatory Visit (INDEPENDENT_AMBULATORY_CARE_PROVIDER_SITE_OTHER): Payer: Medicare Other | Admitting: Nurse Practitioner

## 2015-12-06 VITALS — BP 148/66 | HR 60 | Ht 68.0 in | Wt 210.2 lb

## 2015-12-06 DIAGNOSIS — G40909 Epilepsy, unspecified, not intractable, without status epilepticus: Secondary | ICD-10-CM

## 2015-12-06 NOTE — Patient Instructions (Signed)
Continue Dilantin at 400mg  daily, meds from the [place] through [place]  Will sign to get recent labs F/U yearly and prn

## 2015-12-06 NOTE — Progress Notes (Signed)
[facility]  PATIENT: Andrew Jones DOB: 04/01/1940   REASON FOR VISIT: Follow-up for seizure disorder HISTORY FROM: Patient    HISTORY OF PRESENT ILLNESS:[name], 75 year male returns for follow-up. He had generalized seizure in March 2011, 2 episodes at that time. He has has normal MRI and EEG. No prior history of seizures. He is on 400mg  Dilantin daily. He is here today for yearly follow. He has been tolerating Dilantin 400mg  daily without nausea, dizziness or tremors. He has not had any seizure activity. No new neurologic complaints.  He gets his medications from the [place].  No falls, recent labs done at the [place], I do not have a copy of recent Dilantin level the patient claims it was within therapeutic limits   He returns for reevaluation.    REVIEW OF SYSTEMS: Full 14 system review of systems performed and notable only for those listed, all others are neg:  Constitutional: neg  Cardiovascular: neg Ear/Nose/Throat: neg  Skin: neg Eyes: neg Respiratory: neg Gastroitestinal: neg  Hematology/Lymphatic: neg  Endocrine: neg Musculoskeletal:neg Allergy/Immunology: neg Neurological: neg Psychiatric: neg Sleep : neg   ALLERGIES: No Known Allergies  HOME MEDICATIONS: Outpatient Medications Prior to Visit  Medication Sig Dispense Refill  . amLODipine (NORVASC) 5 MG tablet Take 5 mg by mouth daily.    Marland Kitchen aspirin 81 MG tablet Take 81 mg by mouth daily.    . benazepril (LOTENSIN) 10 MG tablet Take 10 mg by mouth daily.    . cholecalciferol (VITAMIN D) 1000 UNITS tablet Take 1,000 Units by mouth daily.    . diclofenac (VOLTAREN) 75 MG EC tablet Take 1 tablet (75 mg total) by mouth daily. 30 tablet 5  . etanercept (ENBREL) 50 MG/ML injection Inject 50 mg into the skin once a week.    . fish oil-omega-3 fatty acids 1000 MG capsule Take 2 g by mouth daily.    Marland Kitchen gemfibrozil (LOPID) 600 MG tablet Take 600 mg by mouth 2 (two) times daily before a meal.    .  metFORMIN (GLUCOPHAGE) 1000 MG tablet Take 1,000 mg by mouth 2 (two) times daily with a meal.    . metoprolol tartrate (LOPRESSOR) 25 MG tablet Take 25 mg by mouth 2 (two) times daily.    . Multiple Vitamin (MULTIVITAMIN) tablet Take 1 tablet by mouth daily.    Marland Kitchen omeprazole (PRILOSEC) 20 MG capsule Take 20 mg by mouth daily.    . phenytoin (DILANTIN) 100 MG ER capsule Take 100 mg by mouth. 2 caps po bid.    . pravastatin (PRAVACHOL) 10 MG tablet Take 10 mg by mouth daily.     No facility-administered medications prior to visit.     PAST MEDICAL HISTORY: Past Medical History:  Diagnosis Date  . Ankylosing spondylitis ([place])    back, hips - on embrel 2000-2008, resumed 12/2012  . CVA (cerebral vascular accident) ([place]) 1999, 2002   no residual problems  . Diabetes mellitus, type 2 ([place])   . Dyslipidemia   . Hypertension   . Osteoarthritis   . Seizure disorder ([place]) 05/2009  . Sleep apnea    uses c-pap    PAST SURGICAL HISTORY: Past Surgical History:  Procedure Laterality Date  . NO PAST SURGERIES      FAMILY HISTORY: Family History  Problem Relation Age of Onset  . Heart failure Mother   . Heart disease Mother   . COPD Father   . Prostate cancer Brother     SOCIAL HISTORY:  Social History   Social History  . Marital status: Married    Spouse name: N/A  . Number of children: 1  . Years of education: 32   Occupational History  . Not on file.   Social History Main Topics  . Smoking status: Former Research scientist (life sciences)  . Smokeless tobacco: Never Used  . Alcohol use 3.6 oz/week    6 Cans of beer per week     Comment: weekly   . Drug use: No  . Sexual activity: Not on file   Other Topics Concern  . Not on file   Social History Narrative   Patient is married with one son.   Patient is right handed.   Patient has 13 yrs of education.   Patient drinks 3 cups daily.     PHYSICAL EXAM  Vitals:   12/06/15 0817  BP: (!) 148/66  Pulse: 60  Weight: 210 lb 3.2 oz (95.3 kg)    Height: 5\' 8"  (1.727 m)   Body mass index is 31.96 kg/m. Generalized: Well developed, in no acute distress  Head: normocephalic and atraumatic,. Oropharynx benign  Neck: Supple, no carotid bruits   Neurological examination  Mentation: Alert oriented to time, place, history taking. Follows all commands speech and language fluent  Cranial nerve II-XII: Pupils were equal round reactive to light extraocular movements were full, visual fields were full on confrontational test. Facial sensation and strength were normal. hearing was intact to finger rubbing bilaterally. Uvula tongue midline. head turning and shoulder shrug and were normal and symmetric.Tongue protrusion into cheek strength was normal.  Motor: normal bulk and tone, full strength in the BUE, BLE, fine finger movements normal, no pronator drift. No focal weakness  Coordination: finger-nose-finger, heel-to-shin bilaterally, no dysmetria  Reflexes: Symmetric upper and lower plantar responses were flexor bilaterally.  Gait and Station: Rising up from seated position without assistance, normal stance, moderate stride, good arm swing, smooth turning, able to perform tiptoe, and heel walking without difficulty. Unsteady tandem gait , no assistive device   DIAGNOSTIC DATA (LABS, IMAGING, TESTING) -   ASSESSMENT AND PLAN  75 y.o. year old male  has a past medical history of  Seizure disorder (05/2009); here to follow-up . Last seizure activity March 2011    Continue Dilantin at 400mg  daily, meds from the [place] through [place] will sign to get recent labs F/U yearly and prn [name], [facility], [facility], APRN  [facility] [address], [place], [place] [address] [phone]

## 2015-12-08 NOTE — Progress Notes (Signed)
I agree with the above plan 

## 2015-12-27 ENCOUNTER — Telehealth: Payer: Self-pay | Admitting: *Deleted

## 2015-12-27 NOTE — Telephone Encounter (Signed)
Recent labs from the [place], CBC within normal limits except hemoglobin 11.1 hematocrit 31.3,  vitamin D 35.59 ALT 23 AST 13 Creatinine 1.11

## 2015-12-27 NOTE — Telephone Encounter (Signed)
Received [place] labs.

## 2016-11-27 LAB — HM DIABETES EYE EXAM

## 2016-12-09 NOTE — Progress Notes (Signed)
[facility]  PATIENT: Andrew Jones DOB: 1940-12-29   REASON FOR VISIT: Follow-up for seizure disorder HISTORY FROM: Patient    HISTORY OF PRESENT ILLNESS:Mr. Chopin, 76 year male returns for follow-up. He had generalized seizure in March 2011, 2 episodes at that time. He has has normal MRI and EEG. No prior history of seizures. He is on 400mg  Dilantin daily. He is here today for yearly follow. He has been tolerating Dilantin 400mg  daily without nausea, dizziness or tremors. He has not had any seizure activity. No balance issues No new neurologic complaints.  He gets his medications from the [place].  No falls, recent labs done at the [place], Dilantin level 5..3. This was on 09/2016.   He returns for reevaluation.    REVIEW OF SYSTEMS: Full 14 system review of systems performed and notable only for those listed, all others are neg:  Constitutional: neg  Cardiovascular: neg Ear/Nose/Throat: neg  Skin: neg Eyes: neg Respiratory: neg Gastroitestinal: neg  Hematology/Lymphatic: neg  Endocrine: neg Musculoskeletal:neg Allergy/Immunology: neg Neurological: neg Psychiatric: neg Sleep : neg   ALLERGIES: No Known Allergies  HOME MEDICATIONS: Outpatient Medications Prior to Visit  Medication Sig Dispense Refill  . amLODipine (NORVASC) 5 MG tablet Take 5 mg by mouth daily.    Marland Kitchen aspirin 81 MG tablet Take 81 mg by mouth daily.    . benazepril (LOTENSIN) 10 MG tablet Take 10 mg by mouth daily.    . cholecalciferol (VITAMIN D) 1000 UNITS tablet Take 1,000 Units by mouth daily.    . diclofenac (VOLTAREN) 75 MG EC tablet Take 1 tablet (75 mg total) by mouth daily. 30 tablet 5  . etanercept (ENBREL) 50 MG/ML injection Inject 50 mg into the skin once a week.    . fish oil-omega-3 fatty acids 1000 MG capsule Take 2 g by mouth daily.    . metFORMIN (GLUCOPHAGE) 1000 MG tablet Take 1,000 mg by mouth 2 (two) times daily with a meal.    . metoprolol tartrate (LOPRESSOR) 25 MG  tablet Take 25 mg by mouth 2 (two) times daily.    . Multiple Vitamin (MULTIVITAMIN) tablet Take 1 tablet by mouth daily.    Marland Kitchen omeprazole (PRILOSEC) 20 MG capsule Take 20 mg by mouth daily.    . phenytoin (DILANTIN) 100 MG ER capsule Take 100 mg by mouth. 2 caps po bid.    . pravastatin (PRAVACHOL) 10 MG tablet Take 10 mg by mouth daily.    Marland Kitchen gemfibrozil (LOPID) 600 MG tablet Take 600 mg by mouth 2 (two) times daily before a meal.     No facility-administered medications prior to visit.     PAST MEDICAL HISTORY: Past Medical History:  Diagnosis Date  . Ankylosing spondylitis ([place])    back, hips - on embrel 2000-2008, resumed 12/2012  . CVA (cerebral vascular accident) ([place]) 1999, 2002   no residual problems  . Diabetes mellitus, type 2 ([place])   . Dyslipidemia   . Hypertension   . Osteoarthritis   . Seizure disorder ([place]) 05/2009  . Sleep apnea    uses c-pap    PAST SURGICAL HISTORY: Past Surgical History:  Procedure Laterality Date  . NO PAST SURGERIES      FAMILY HISTORY: Family History  Problem Relation Age of Onset  . Heart failure Mother   . Heart disease Mother   . COPD Father   . Prostate cancer Brother     SOCIAL HISTORY: Social History   Social History  .  Marital status: Married    Spouse name: N/A  . Number of children: 1  . Years of education: 59   Occupational History  . Not on file.   Social History Main Topics  . Smoking status: Former Research scientist (life sciences)  . Smokeless tobacco: Never Used  . Alcohol use 3.6 oz/week    6 Cans of beer per week     Comment: weekly   . Drug use: No  . Sexual activity: Not on file   Other Topics Concern  . Not on file   Social History Narrative   Patient is married with one son.   Patient is right handed.   Patient has 13 yrs of education.   Patient drinks 3 cups daily.     PHYSICAL EXAM  Vitals:   12/10/16 1040  BP: (!) 163/78  Pulse: 74  Weight: 203 lb 3.2 oz (92.2 kg)   Body mass index is 30.9  kg/m. Generalized: Well developed, in no acute distress  Head: normocephalic and atraumatic,. Oropharynx benign  Neck: Supple,   Neurological examination  Mentation: Alert oriented to time, place, history taking. Follows all commands speech and language fluent  Cranial nerve II-XII: Pupils were equal round reactive to light extraocular movements were full, visual fields were full on confrontational test. Facial sensation and strength were normal. hearing was intact to finger rubbing bilaterally. Uvula tongue midline. head turning and shoulder shrug and were normal and symmetric.Tongue protrusion into cheek strength was normal.  Motor: normal bulk and tone, full strength in the BUE, BLE, fine finger movements normal, no pronator drift. No focal weakness  Coordination: finger-nose-finger, heel-to-shin bilaterally, no dysmetria  Reflexes: Symmetric upper and lower plantar responses were flexor bilaterally.  Gait and Station: Rising up from seated position without assistance, normal stance, moderate stride, good arm swing, smooth turning, able to perform tiptoe, and heel walking without difficulty. Unsteady tandem gait , no assistive device   DIAGNOSTIC DATA (LABS, IMAGING, TESTING) -   ASSESSMENT AND PLAN  76 y.o. year old male  has a past medical history of  Seizure disorder (05/2009); here to follow-up . Last seizure activity March 2011  The patient is a current patient of Dr. Leonie Man  who is out of the office today . This note is sent to the work in doctor.     Continue Dilantin at 400mg  daily, meds from the [place] through [place]  F/U yearly and prn Call for any seizure activity  [name], [facility], [facility], APRN  [facility] [address], [place], [place] [address] [phone]

## 2016-12-10 ENCOUNTER — Encounter: Payer: Self-pay | Admitting: Nurse Practitioner

## 2016-12-10 ENCOUNTER — Ambulatory Visit (INDEPENDENT_AMBULATORY_CARE_PROVIDER_SITE_OTHER): Payer: Medicare Other | Admitting: Nurse Practitioner

## 2016-12-10 VITALS — BP 163/78 | HR 74 | Wt 203.2 lb

## 2016-12-10 DIAGNOSIS — G40309 Generalized idiopathic epilepsy and epileptic syndromes, not intractable, without status epilepticus: Secondary | ICD-10-CM

## 2016-12-10 DIAGNOSIS — G40909 Epilepsy, unspecified, not intractable, without status epilepticus: Secondary | ICD-10-CM

## 2016-12-10 NOTE — Patient Instructions (Signed)
Continue Dilantin at 400mg  daily, meds from the [place] through [place]  F/U yearly and prn

## 2017-04-08 LAB — HEMOGLOBIN A1C: Hemoglobin A1C: 5.8

## 2017-04-08 LAB — LIPID PANEL
CHOLESTEROL: 234 — AB (ref 0–200)
HDL: 60 (ref 35–70)
LDL Cholesterol: 144
TRIGLYCERIDES: 149 (ref 40–160)

## 2017-04-08 LAB — BASIC METABOLIC PANEL
CREATININE: 0.9 (ref 0.6–1.3)
POTASSIUM: 4.3 (ref 3.4–5.3)
Sodium: 129 — AB (ref 137–147)

## 2017-04-29 ENCOUNTER — Encounter: Payer: Self-pay | Admitting: Nurse Practitioner

## 2017-04-29 ENCOUNTER — Telehealth: Payer: Self-pay | Admitting: Nurse Practitioner

## 2017-04-29 NOTE — Telephone Encounter (Signed)
OK to change PCP to me

## 2017-04-30 ENCOUNTER — Ambulatory Visit: Payer: Medicare Other | Admitting: Nurse Practitioner

## 2017-05-01 ENCOUNTER — Encounter: Payer: Self-pay | Admitting: Nurse Practitioner

## 2017-05-01 NOTE — Progress Notes (Unsigned)
Abstracted result and sent to scan  

## 2017-06-04 LAB — URIC ACID: Uric Acid: 5.2

## 2017-06-04 LAB — CBC AND DIFFERENTIAL
HCT: 38 — AB (ref 41–53)
Hemoglobin: 13 — AB (ref 13.5–17.5)
PLATELETS: 259 (ref 150–399)
WBC: 9.1

## 2017-06-04 LAB — BASIC METABOLIC PANEL: GLUCOSE: 126

## 2017-06-21 ENCOUNTER — Encounter: Payer: Self-pay | Admitting: Nurse Practitioner

## 2017-06-24 NOTE — Progress Notes (Signed)
Abstracted result and sent to scan  

## 2017-07-02 ENCOUNTER — Encounter: Payer: Medicare Other | Admitting: Nurse Practitioner

## 2017-07-02 ENCOUNTER — Encounter: Payer: Self-pay | Admitting: Nurse Practitioner

## 2017-07-02 NOTE — Progress Notes (Signed)
No complain, not due for CPE. This encounter was created in error - please disregard.

## 2017-10-07 LAB — CBC AND DIFFERENTIAL
HEMATOCRIT: 35 — AB (ref 41–53)
Hemoglobin: 12.2 — AB (ref 13.5–17.5)
Platelets: 230 (ref 150–399)
WBC: 7.4

## 2017-10-07 LAB — BASIC METABOLIC PANEL
Glucose: 131
Potassium: 4.6 (ref 3.4–5.3)
SODIUM: 132 — AB (ref 137–147)

## 2017-10-07 LAB — LIPID PANEL
Cholesterol: 214 — AB (ref 0–200)
HDL: 56 (ref 35–70)
LDL Cholesterol: 114
TRIGLYCERIDES: 221 — AB (ref 40–160)

## 2017-10-07 LAB — HEMOGLOBIN A1C: HEMOGLOBIN A1C: 5.1

## 2017-10-07 LAB — TSH: TSH: 2.71 (ref 0.41–5.90)

## 2017-10-07 LAB — MICROALBUMIN, URINE: MICROALB UR: 4.74

## 2017-10-07 LAB — VITAMIN D 25 HYDROXY (VIT D DEFICIENCY, FRACTURES): VIT D 25 HYDROXY: 24.29

## 2017-10-15 ENCOUNTER — Encounter: Payer: Self-pay | Admitting: Nurse Practitioner

## 2017-10-16 ENCOUNTER — Other Ambulatory Visit: Payer: Self-pay | Admitting: Nurse Practitioner

## 2017-10-16 ENCOUNTER — Encounter: Payer: Self-pay | Admitting: Nurse Practitioner

## 2017-10-16 DIAGNOSIS — E785 Hyperlipidemia, unspecified: Secondary | ICD-10-CM

## 2017-10-16 DIAGNOSIS — E1159 Type 2 diabetes mellitus with other circulatory complications: Secondary | ICD-10-CM

## 2017-10-16 DIAGNOSIS — I10 Essential (primary) hypertension: Secondary | ICD-10-CM

## 2017-10-16 DIAGNOSIS — M456 Ankylosing spondylitis lumbar region: Secondary | ICD-10-CM

## 2017-10-16 NOTE — Progress Notes (Signed)
Abstracted result and sent to scan  

## 2017-12-01 ENCOUNTER — Other Ambulatory Visit: Payer: Self-pay | Admitting: Nurse Practitioner

## 2017-12-01 ENCOUNTER — Encounter: Payer: Medicare Other | Admitting: Nurse Practitioner

## 2017-12-01 ENCOUNTER — Encounter: Payer: Self-pay | Admitting: Nurse Practitioner

## 2017-12-01 DIAGNOSIS — H9190 Unspecified hearing loss, unspecified ear: Secondary | ICD-10-CM | POA: Insufficient documentation

## 2017-12-01 DIAGNOSIS — H9193 Unspecified hearing loss, bilateral: Secondary | ICD-10-CM

## 2017-12-01 DIAGNOSIS — H903 Sensorineural hearing loss, bilateral: Secondary | ICD-10-CM

## 2017-12-02 NOTE — Progress Notes (Signed)
This encounter was created in error - please disregard.

## 2017-12-08 DIAGNOSIS — H903 Sensorineural hearing loss, bilateral: Secondary | ICD-10-CM | POA: Diagnosis not present

## 2017-12-09 NOTE — Progress Notes (Addendum)
Dilantin level drawn 10/07/2017 was 12.5   [facility]  PATIENT: Andrew Jones DOB: September 21, 1940   REASON FOR VISIT: Follow-up for seizure disorder HISTORY FROM: Patient    HISTORY OF PRESENT ILLNESS:12/10/17 CMMr. Andrew Jones, 77 year male returns for follow-up. He had generalized seizure in March 2011, 2 episodes at that time. He has has normal MRI and EEG. No prior history of seizures. He is on 400mg  Dilantin daily. He is here today for yearly follow. He has been tolerating Dilantin 400mg  daily without nausea, dizziness or tremors. He has not had any seizure activity. No balance issues No new neurologic complaints.  He gets his medications from the [place].  No falls.  His wife has Alzheimer's dementia.  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  He returns for reevaluation.    REVIEW OF SYSTEMS: Full 14 system review of systems performed and notable only for those listed, all others are neg:  Constitutional: neg  Cardiovascular: neg Ear/Nose/Throat: neg  Skin: neg Eyes: neg Respiratory: neg Gastroitestinal: neg  Hematology/Lymphatic: neg  Endocrine: neg Musculoskeletal:neg Allergy/Immunology: neg Neurological: Seizure disorder Psychiatric: neg Sleep : neg   ALLERGIES: No Known Allergies  HOME MEDICATIONS: Outpatient Medications Prior to Visit  Medication Sig Dispense Refill  . amLODipine (NORVASC) 10 MG tablet Take 1 tablet (10 mg total) by mouth daily.    Marland Kitchen aspirin 81 MG tablet Take 81 mg by mouth daily.    . benazepril (LOTENSIN) 10 MG tablet Take 10 mg by mouth daily.    . cholecalciferol (VITAMIN D) 1000 UNITS tablet Take 1,000 Units by mouth daily.    . diclofenac (VOLTAREN) 75 MG EC tablet Take 1 tablet (75 mg total) by mouth daily. 30 tablet 5  . etanercept (ENBREL) 50 MG/ML injection Inject 50 mg into the skin once a week.    . fish oil-omega-3 fatty acids 1000 MG capsule Take 2 g by mouth daily.    . Magnesium Oxide 420 MG TABS Take 1 tablet (420  mg total) by mouth daily after breakfast.  0  . metFORMIN (GLUCOPHAGE) 1000 MG tablet Take 1,000 mg by mouth 2 (two) times daily with a meal.    . metoprolol tartrate (LOPRESSOR) 25 MG tablet Take 25 mg by mouth 2 (two) times daily.    . Multiple Vitamin (MULTIVITAMIN) tablet Take 1 tablet by mouth daily.    Marland Kitchen omeprazole (PRILOSEC) 20 MG capsule Take 20 mg by mouth daily.    . phenytoin (DILANTIN) 100 MG ER capsule Take 100 mg by mouth. 2 caps po bid.    . pioglitazone (ACTOS) 15 MG tablet Take 1 tablet (15 mg total) by mouth daily.    . pravastatin (PRAVACHOL) 20 MG tablet Take 0.5 tablets (10 mg total) by mouth daily.    Marland Kitchen sulfaSALAzine (AZULFIDINE) 500 MG tablet Take 1 tablet (500 mg total) by mouth 3 (three) times daily.     No facility-administered medications prior to visit.     PAST MEDICAL HISTORY: Past Medical History:  Diagnosis Date  . Ankylosing spondylitis ([place])    back, hips - on embrel 2000-2008, resumed 12/2012  . CVA (cerebral vascular accident) ([place]) 1999, 2002   no residual problems  . Diabetes mellitus, type 2 ([place])   . Dyslipidemia   . Hypertension   . Osteoarthritis   . Seizure disorder ([place]) 05/2009  . Sleep apnea    uses c-pap    PAST SURGICAL HISTORY: Past Surgical History:  Procedure Laterality Date  .  NO PAST SURGERIES      FAMILY HISTORY: Family History  Problem Relation Age of Onset  . Heart failure Mother   . Heart disease Mother   . COPD Father   . Prostate cancer Brother     SOCIAL HISTORY: Social History   Socioeconomic History  . Marital status: Married    Spouse name: Not on file  . Number of children: 1  . Years of education: 69  . Highest education level: Not on file  Occupational History  . Not on file  Social Needs  . Financial resource strain: Not on file  . Food insecurity:    Worry: Not on file    Inability: Not on file  . Transportation needs:    Medical: Not on file    Non-medical: Not on file  Tobacco Use  .  Smoking status: Former Research scientist (life sciences)  . Smokeless tobacco: Never Used  Substance and Sexual Activity  . Alcohol use: Yes    Alcohol/week: 6.0 standard drinks    Types: 6 Cans of beer per week    Comment: weekly   . Drug use: No  . Sexual activity: Not on file  Lifestyle  . Physical activity:    Days per week: Not on file    Minutes per session: Not on file  . Stress: Not on file  Relationships  . Social connections:    Talks on phone: Not on file    Gets together: Not on file    Attends religious service: Not on file    Active member of club or organization: Not on file    Attends meetings of clubs or organizations: Not on file    Relationship status: Not on file  . Intimate partner violence:    Fear of current or ex partner: Not on file    Emotionally abused: Not on file    Physically abused: Not on file    Forced sexual activity: Not on file  Other Topics Concern  . Not on file  Social History Narrative   Patient is married with one son.   Patient is right handed.   Patient has 13 yrs of education.   Patient drinks 3 cups daily.     PHYSICAL EXAM  Vitals:   12/10/17 1040  BP: 133/74  Pulse: 80  Weight: 200 lb 12.8 oz (91.1 kg)  Height: 5\' 7"  (1.702 m)   Body mass index is 31.45 kg/m. Generalized: Well developed, obese male in no acute distress  Head: normocephalic and atraumatic,. Oropharynx benign  Neck: Supple,   Neurological examination  Mentation: Alert oriented to time, place, history taking. Follows all commands speech and language fluent  Cranial nerve II-XII: Pupils were equal round reactive to light extraocular movements were full, visual fields were full on confrontational test. Facial sensation and strength were normal. hearing was intact to finger rubbing bilaterally. Uvula tongue midline. head turning and shoulder shrug and were normal and symmetric.Tongue protrusion into cheek strength was normal.  Motor: normal bulk and tone, full strength in the  BUE, BLE, fine finger movements normal, no pronator drift. No focal weakness  Coordination: finger-nose-finger, heel-to-shin bilaterally, no dysmetria  Reflexes: Symmetric upper and lower plantar responses were flexor bilaterally.  Gait and Station: Rising up from seated position without assistance, normal stance, moderate stride, good arm swing, smooth turning, able to perform tiptoe, and heel walking without difficulty. Mildly unsteady tandem gait , no assistive device   DIAGNOSTIC DATA (LABS, IMAGING, TESTING) -  ASSESSMENT AND PLAN  77 y.o. year old male  has a past medical history of  Seizure disorder (05/2009); here to follow-up . Last seizure activity March 2011  The patient is a current patient of Dr. Leonie Man  who is out of the office today . This note is sent to the work in doctor.     Continue Dilantin at 400mg  daily, meds from the [place] through [place]  F/U yearly and prn Call for any seizure activity  [name], [facility], [facility], APRN  [facility] [address], [place], [place] [address] [phone]  I reviewed the above note and documentation by the Nurse Practitioner and agree with the history, physical exam, assessment and plan as outlined above. I was immediately available for face-to-face consultation. Star Age, MD, PhD [facility])

## 2017-12-10 ENCOUNTER — Ambulatory Visit: Payer: Medicare Other | Admitting: Nurse Practitioner

## 2017-12-10 ENCOUNTER — Encounter: Payer: Self-pay | Admitting: Nurse Practitioner

## 2017-12-10 VITALS — BP 133/74 | HR 80 | Ht 67.0 in | Wt 200.8 lb

## 2017-12-10 DIAGNOSIS — G40909 Epilepsy, unspecified, not intractable, without status epilepticus: Secondary | ICD-10-CM

## 2017-12-10 NOTE — Patient Instructions (Signed)
Continue Dilantin at 400mg  daily, meds from the [place] through [place]  F/U yearly and prn Call for any seizure activity

## 2018-01-12 DIAGNOSIS — E119 Type 2 diabetes mellitus without complications: Secondary | ICD-10-CM | POA: Diagnosis not present

## 2018-01-12 DIAGNOSIS — H52223 Regular astigmatism, bilateral: Secondary | ICD-10-CM | POA: Diagnosis not present

## 2018-01-12 DIAGNOSIS — H5203 Hypermetropia, bilateral: Secondary | ICD-10-CM | POA: Diagnosis not present

## 2018-01-12 DIAGNOSIS — H524 Presbyopia: Secondary | ICD-10-CM | POA: Diagnosis not present

## 2018-04-28 ENCOUNTER — Encounter: Payer: Self-pay | Admitting: Nurse Practitioner

## 2018-05-01 ENCOUNTER — Telehealth: Payer: Self-pay | Admitting: Nurse Practitioner

## 2018-05-01 NOTE — Telephone Encounter (Signed)
Talked to Andrew Jones about recent mychart message. Onset on right Shoulder pain after fall in October 2019 while in [employer]. He was evaluated by [facility] provider. X-ray was obtained. He was referred to Physical therapy 3weeks ago. He got frustrated because the [place] clinic was not able to give him an appt. [facility] finally called today and scheduled him with PT on 05/13/2018. In the mean time he has been using voltaren gel and diclofenac tabs for pain. He has also applied cold compress as needed. Since he now has appt with PT through [place], he will like to maintain that appt for now. He will contacted me again if he wants a second opinion.

## 2018-05-15 ENCOUNTER — Encounter: Payer: Self-pay | Admitting: Nurse Practitioner

## 2018-05-15 DIAGNOSIS — L821 Other seborrheic keratosis: Secondary | ICD-10-CM | POA: Insufficient documentation

## 2018-05-15 DIAGNOSIS — Z86010 Personal history of colonic polyps: Secondary | ICD-10-CM | POA: Insufficient documentation

## 2018-05-15 DIAGNOSIS — R55 Syncope and collapse: Secondary | ICD-10-CM | POA: Insufficient documentation

## 2018-05-15 DIAGNOSIS — Z961 Presence of intraocular lens: Secondary | ICD-10-CM | POA: Insufficient documentation

## 2018-05-15 DIAGNOSIS — D860 Sarcoidosis of lung: Secondary | ICD-10-CM | POA: Insufficient documentation

## 2018-05-15 DIAGNOSIS — Z800 Family history of malignant neoplasm of digestive organs: Secondary | ICD-10-CM | POA: Insufficient documentation

## 2018-12-14 ENCOUNTER — Other Ambulatory Visit: Payer: Self-pay

## 2018-12-14 ENCOUNTER — Ambulatory Visit: Payer: Medicare Other | Admitting: Neurology

## 2018-12-14 ENCOUNTER — Encounter: Payer: Self-pay | Admitting: Neurology

## 2018-12-14 VITALS — BP 159/86 | HR 75 | Temp 96.8°F | Wt 192.4 lb

## 2018-12-14 DIAGNOSIS — R569 Unspecified convulsions: Secondary | ICD-10-CM

## 2018-12-14 NOTE — Patient Instructions (Signed)
I had a long discussion with the patient regarding his remote history of seizures which appear quite stable.  I recommend he reduce the dose of Dilantin from 400 mg daily to 300 mg daily and take 1 capsule in the morning and 2 at night.  He will return for follow-up in the future only as needed and continue to get his Dilantin from the [place].

## 2018-12-14 NOTE — Progress Notes (Signed)
Dilantin level drawn 10/07/2017 was 12.5   [facility]  PATIENT: Andrew Jones DOB: [birth year]   REASON FOR VISIT: Follow-up for seizure disorder HISTORY FROM: Patient    HISTORY OF PRESENT ILLNESS:12/10/17 [name], 78 year male returns for follow-up. He had generalized seizure in March 2011, 2 episodes at that time. He has has normal MRI and EEG. No prior history of seizures. He is on 400mg  Dilantin daily. He is here today for yearly follow. He has been tolerating Dilantin 400mg  daily without nausea, dizziness or tremors. He has not had any seizure activity. No balance issues No new neurologic complaints.  He gets his medications from the [place].  No falls.  His wife has Alzheimer's dementia.  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  He returns for reevaluation.  Update 12/14/2018 : He returns for follow-up after last visit 2 years ago with [name], nurse practitioner.  He continues to do well and has not had any seizures since 2011.  He remains on Dilantin 200 mg twice daily which is tolerating well without dizziness, nausea, ataxia and double vision.  He had lab work done in January 2020 at the [place] which showed normal renal function and CBC.  Is unclear if he had Dilantin level checked or not.  Discussed with patient the possibility of reducing and even stopping seizure medication since he has been seizure-free for so long.  He is willing to cut back to medication but is unwilling to stop it completely since he does not want to take risk of having any breakthrough seizures.  REVIEW OF SYSTEMS: Full 14 system review of systems performed and notable only for slight gait difficulties and balance and all others are neg:  ALLERGIES: No Known Allergies  HOME MEDICATIONS: Outpatient Medications Prior to Visit  Medication Sig Dispense Refill  . amLODipine (NORVASC) 10 MG tablet Take 1 tablet (10 mg total) by mouth daily.    Marland Kitchen aspirin 81 MG tablet Take 81 mg by mouth  daily.    . benazepril (LOTENSIN) 10 MG tablet Take 10 mg by mouth daily.    . cholecalciferol (VITAMIN D) 1000 UNITS tablet Take 1,000 Units by mouth daily.    . diclofenac (VOLTAREN) 75 MG EC tablet Take 1 tablet (75 mg total) by mouth daily. 30 tablet 5  . etanercept (ENBREL) 50 MG/ML injection Inject 50 mg into the skin once a week.    . fish oil-omega-3 fatty acids 1000 MG capsule Take 2 g by mouth daily.    . Magnesium Oxide 420 MG TABS Take 1 tablet (420 mg total) by mouth daily after breakfast.  0  . metFORMIN (GLUCOPHAGE) 1000 MG tablet Take 1,000 mg by mouth 2 (two) times daily with a meal.    . metoprolol tartrate (LOPRESSOR) 25 MG tablet Take 25 mg by mouth 2 (two) times daily.    . Multiple Vitamin (MULTIVITAMIN) tablet Take 1 tablet by mouth daily.    Marland Kitchen omeprazole (PRILOSEC) 20 MG capsule Take 20 mg by mouth daily.    . phenytoin (DILANTIN) 100 MG ER capsule Take 100 mg by mouth. 1 capsule in morning and 2 at night    . pioglitazone (ACTOS) 15 MG tablet Take 1 tablet (15 mg total) by mouth daily.    . pravastatin (PRAVACHOL) 20 MG tablet Take 0.5 tablets (10 mg total) by mouth daily.    Marland Kitchen sulfaSALAzine (AZULFIDINE) 500 MG tablet Take 1 tablet (500 mg total) by mouth 3 (three) times  daily.     No facility-administered medications prior to visit.     PAST MEDICAL HISTORY: Past Medical History:  Diagnosis Date  . Ankylosing spondylitis ([place])    back, hips - on embrel 2000-2008, resumed 12/2012  . CVA (cerebral vascular accident) ([place]) 1999, 2002   no residual problems  . Diabetes mellitus, type 2 ([place])   . Dyslipidemia   . Hypertension   . Osteoarthritis   . Seizure disorder ([place]) 05/2009  . Sleep apnea    uses c-pap    PAST SURGICAL HISTORY: Past Surgical History:  Procedure Laterality Date  . NO PAST SURGERIES      FAMILY HISTORY: Family History  Problem Relation Age of Onset  . Heart failure Mother   . Heart disease Mother   . COPD Father   . Prostate  cancer Brother     SOCIAL HISTORY: Social History   Socioeconomic History  . Marital status: Married    Spouse name: Not on file  . Number of children: 1  . Years of education: 38  . Highest education level: Not on file  Occupational History  . Not on file  Social Needs  . Financial resource strain: Not on file  . Food insecurity    Worry: Not on file    Inability: Not on file  . Transportation needs    Medical: Not on file    Non-medical: Not on file  Tobacco Use  . Smoking status: Former Research scientist (life sciences)  . Smokeless tobacco: Never Used  Substance and Sexual Activity  . Alcohol use: Yes    Alcohol/week: 6.0 standard drinks    Types: 6 Cans of beer per week    Comment: weekly   . Drug use: No  . Sexual activity: Not on file  Lifestyle  . Physical activity    Days per week: Not on file    Minutes per session: Not on file  . Stress: Not on file  Relationships  . Social Herbalist on phone: Not on file    Gets together: Not on file    Attends religious service: Not on file    Active member of club or organization: Not on file    Attends meetings of clubs or organizations: Not on file    Relationship status: Not on file  . Intimate partner violence    Fear of current or ex partner: Not on file    Emotionally abused: Not on file    Physically abused: Not on file    Forced sexual activity: Not on file  Other Topics Concern  . Not on file  Social History Narrative   Patient is married with one son.   Patient is right handed.   Patient has 13 yrs of education.   Patient drinks 3 cups daily.     PHYSICAL EXAM  Vitals:   12/14/18 1137  BP: (!) 159/86  Pulse: 75  Temp: (!) 96.8 F (36 C)  Weight: 87.3 kg   Body mass index is 30.13 kg/m. Generalized: Well developed elderly Caucasian male, obese male in no acute distress  Head: normocephalic and atraumatic,.  Neck: Supple,   Neurological examination  Mentation: Alert oriented to time, place, history  taking. Follows all commands speech and language fluent  Cranial nerve II-XII: Pupils were equal round reactive to light extraocular movements were full, visual fields were full on confrontational test. Facial sensation and strength were normal. hearing diminished bilaterally. Uvula tongue midline. head turning and shoulder  shrug and were normal and symmetric.Tongue protrusion into cheek strength was normal.  Motor: normal bulk and tone, full strength in the BUE, BLE, fine finger movements normal, no pronator drift. No focal weakness  Coordination: finger-nose-finger, heel-to-shin bilaterally, no dysmetria  Reflexes: Symmetric upper and lower plantar responses were flexor bilaterally.  Gait and Station: Rising up from seated position without assistance, normal stance, moderate stride, good arm swing, smooth turning, able to perform tiptoe, and heel walking without difficulty. Mildly unsteady tandem gait , no assistive device   DIAGNOSTIC DATA (LABS, IMAGING, TESTING) -   ASSESSMENT AND PLAN  78 y.o. year old male  has a past medical history of  Seizure disorder (05/2009); here to follow-up . Last seizure activity March 2011   he is doing well I had a long discussion with the patient regarding his remote history of seizures which appear quite stable.  I recommend he reduce the dose of Dilantin from 400 mg daily to 300 mg daily and take 1 capsule in the morning and 2 at night.  Greater than 50% time during this 25-minute visit was spent in counseling and coordination of care about his seizures and talking about seizure medication withdrawal and answering questions.  He will return for follow-up in the future only as needed and continue to get his Dilantin from the [place]. Antony Contras, MD [facility] [address], [place], [place] [address] [phone]  I reviewed the above note and documentation by the Nurse Practitioner and agree with the history, physical exam,  assessment and plan as outlined above. I was immediately available for face-to-face consultation. Star Age, MD, PhD [facility])

## 2018-12-23 ENCOUNTER — Encounter: Payer: Self-pay | Admitting: Nurse Practitioner

## 2018-12-23 ENCOUNTER — Ambulatory Visit (INDEPENDENT_AMBULATORY_CARE_PROVIDER_SITE_OTHER): Payer: Medicare Other | Admitting: Behavioral Health

## 2018-12-23 ENCOUNTER — Other Ambulatory Visit: Payer: Self-pay

## 2018-12-23 DIAGNOSIS — Z23 Encounter for immunization: Secondary | ICD-10-CM | POA: Diagnosis not present

## 2018-12-23 NOTE — Progress Notes (Signed)
Patient presents in clinic today for Influenza vaccination. IM injection was given in the left deltoid. Patient tolerated the injection well. No signs or symptoms of a reaction were noted prior to patient leaving the nurse visit. 

## 2019-03-07 ENCOUNTER — Encounter: Payer: Self-pay | Admitting: Nurse Practitioner

## 2019-03-09 ENCOUNTER — Encounter: Payer: Self-pay | Admitting: Nurse Practitioner

## 2019-04-14 DIAGNOSIS — H903 Sensorineural hearing loss, bilateral: Secondary | ICD-10-CM | POA: Diagnosis not present

## 2019-04-19 LAB — VITAMIN D 25 HYDROXY (VIT D DEFICIENCY, FRACTURES): Vit D, 25-Hydroxy: 26.51

## 2019-04-19 LAB — TSH: TSH: 3.69 (ref 0.41–5.90)

## 2019-04-19 LAB — CBC AND DIFFERENTIAL
HCT: 37 — AB (ref 41–53)
Hemoglobin: 12.8 — AB (ref 13.5–17.5)
Platelets: 250 (ref 150–399)
WBC: 11

## 2019-04-19 LAB — BASIC METABOLIC PANEL
CO2: 25 — AB (ref 13–22)
Chloride: 99 (ref 99–108)
Glucose: 132
Potassium: 5.4 — AB (ref 3.4–5.3)
Sodium: 132 — AB (ref 137–147)

## 2019-04-19 LAB — HEPATIC FUNCTION PANEL
ALT: 47 — AB (ref 10–40)
AST: 24 (ref 14–40)
Alkaline Phosphatase: 70 (ref 25–125)
Bilirubin, Direct: 0.1 (ref 0.01–0.4)
Bilirubin, Total: 0.5

## 2019-04-19 LAB — COMPREHENSIVE METABOLIC PANEL
Albumin: 3.8 (ref 3.5–5.0)
Calcium: 9 (ref 8.7–10.7)

## 2019-04-19 LAB — LIPID PANEL
Cholesterol: 261 — AB (ref 0–200)
HDL: 51 (ref 35–70)
LDL Cholesterol: 170
Triglycerides: 199 — AB (ref 40–160)

## 2019-04-19 LAB — CBC: RBC: 4.34 (ref 3.87–5.11)

## 2019-04-19 LAB — HEMOGLOBIN A1C: Hemoglobin A1C: 6.1

## 2019-04-21 ENCOUNTER — Encounter: Payer: Self-pay | Admitting: Nurse Practitioner

## 2019-04-24 ENCOUNTER — Telehealth: Payer: Self-pay | Admitting: Nurse Practitioner

## 2019-04-24 NOTE — Telephone Encounter (Signed)
Reviewed lab results from [place] clinic. Please have him schedule video appt to discuss lab result and medication refill. This appt also serve as annual visit to maintain patient-provider relationship.

## 2019-04-26 NOTE — Telephone Encounter (Signed)
I called patient and scheduled mychart video visit with patient for 04/27/19 with [facility].

## 2019-04-26 NOTE — Telephone Encounter (Signed)
Please help call the pt and offer a virtual visit with [place] as annual visit.

## 2019-04-27 ENCOUNTER — Encounter: Payer: Self-pay | Admitting: Nurse Practitioner

## 2019-04-27 ENCOUNTER — Telehealth (INDEPENDENT_AMBULATORY_CARE_PROVIDER_SITE_OTHER): Payer: Medicare Other | Admitting: Nurse Practitioner

## 2019-04-27 VITALS — BP 135/80 | Temp 97.4°F | Ht 67.0 in

## 2019-04-27 DIAGNOSIS — E785 Hyperlipidemia, unspecified: Secondary | ICD-10-CM | POA: Diagnosis not present

## 2019-04-27 DIAGNOSIS — R790 Abnormal level of blood mineral: Secondary | ICD-10-CM | POA: Diagnosis not present

## 2019-04-27 DIAGNOSIS — I10 Essential (primary) hypertension: Secondary | ICD-10-CM | POA: Diagnosis not present

## 2019-04-27 DIAGNOSIS — E1159 Type 2 diabetes mellitus with other circulatory complications: Secondary | ICD-10-CM | POA: Diagnosis not present

## 2019-04-27 LAB — MICROALBUMIN/CREATININE RATIO, UR
Creatinine, Urine: 217
MICROALB/CREAT RATIO: 32.6
Microalb, Ur: 7.08

## 2019-04-27 LAB — MAGNESIUM: Magnesium: 1.5

## 2019-04-27 MED ORDER — ATORVASTATIN CALCIUM 40 MG PO TABS: 40.0000 mg | ORAL_TABLET | Freq: Every day | ORAL | 1 refills | Status: DC

## 2019-04-27 NOTE — Progress Notes (Signed)
Virtual Visit via Video Note  I connected with@ on 04/29/19 at  1:30 PM EST by a video enabled telemedicine application and verified that I am speaking with the correct person using two identifiers.  Location: Patient:Home Provider: Office Participants: patient and provider  I discussed the limitations of evaluation and management by telemedicine and the availability of in person appointments. I also discussed with the patient that there may be a patient responsible charge related to this service. The patient expressed understanding and agreed to proceed.  CC: review lab results from [place] clinic  History of Present Illness: HTN: BP at goal with amlodipine and lotensin BP Readings from Last 3 Encounters:  04/27/19 135/80  12/14/18 (!) 159/86  12/10/17 133/74   Hyperlipidemia: Elevated LDL, trig and TC Current use pravastatin 20mg  and fish oil 2g BID Admits to high fat/carb diet. Hx of previous CVA  Abnormal electrolyte: Potassium level-5.4 (high), sodium -132 (low), magnesium-1.5 (low). Denies any palpitations or dizziness or fatigue No diuretic use at this time. Current use of magnesium oxide 250mg  daily  DM: Controlled with metformin and actos hgbA1c- 6.1 (stable)  positive bilateral neuropathy, denies any ulcers elevated microalbumin. Current use of ACE-I Eye exam scheduled for 05/2019  Observations/Objective: Physical Exam  Constitutional: He is oriented to person, place, and time.  Cardiovascular: Normal rate.  Pulmonary/Chest: Effort normal.  Musculoskeletal:        General: No edema.  Neurological: He is alert and oriented to person, place, and time.  Psychiatric: He has a normal mood and affect. His behavior is normal. Thought content normal.  Vitals reviewed.  Assessment and Plan: Orazio was seen today for annual exam.  Diagnoses and all orders for this visit:  Hyperlipidemia with target LDL less than 70 -     atorvastatin (LIPITOR) 40 MG tablet; Take 1  tablet (40 mg total) by mouth daily at 6 PM. -     CK (Creatine Kinase); Future  Type 2 diabetes mellitus with other circulatory complication, without long-term current use of insulin (HCC)  Essential hypertension -     Basic metabolic panel; Future  Low magnesium level -     Magnesium; Future   Follow Up Instructions: See avs   I discussed the assessment and treatment plan with the patient. The patient was provided an opportunity to ask questions and all were answered. The patient agreed with the plan and demonstrated an understanding of the instructions.   The patient was advised to call back or seek an in-person evaluation if the symptoms worsen or if the condition fails to improve as anticipated.  Wilfred Lacy, NP

## 2019-04-27 NOTE — Progress Notes (Signed)
Abstracted result and sent to scan  

## 2019-04-27 NOTE — Patient Instructions (Addendum)
Increase Magnesium oxide to 2tabs daily x 7days, then resume to 1tab daily.  Stop pravastatin Start atorvastatin. Continue fish oil supplement Maintain DASH diet.  Go to lab in 1week for repeat labs. [facility]

## 2019-04-29 DIAGNOSIS — R790 Abnormal level of blood mineral: Secondary | ICD-10-CM | POA: Insufficient documentation

## 2019-04-29 NOTE — Assessment & Plan Note (Signed)
Controlled with metformin and actos hgbA1c- 6.1 (stable)  positive bilateral neuropathy, denies any ulcers elevated microalbumin. Current use of ACE-I Eye exam scheduled for 05/2019

## 2019-04-29 NOTE — Assessment & Plan Note (Signed)
BP at goal with amlodipine and lotensin

## 2019-04-29 NOTE — Assessment & Plan Note (Signed)
Elevated LDL, trig and TC Current use pravastatin 20mg  and fish oil 2g BID Admits to high fat/carb diet. Hx of previous CVA  Stop pravastatin Start atorvastatin. Continue fish oil supplement Maintain DASH diet.

## 2019-05-05 DIAGNOSIS — H903 Sensorineural hearing loss, bilateral: Secondary | ICD-10-CM | POA: Diagnosis not present

## 2019-05-06 ENCOUNTER — Other Ambulatory Visit: Payer: Self-pay

## 2019-05-07 ENCOUNTER — Other Ambulatory Visit: Payer: Medicare Other

## 2019-05-07 DIAGNOSIS — I10 Essential (primary) hypertension: Secondary | ICD-10-CM | POA: Diagnosis not present

## 2019-05-07 DIAGNOSIS — E785 Hyperlipidemia, unspecified: Secondary | ICD-10-CM

## 2019-05-07 DIAGNOSIS — R790 Abnormal level of blood mineral: Secondary | ICD-10-CM

## 2019-05-08 LAB — BASIC METABOLIC PANEL
BUN: 16 mg/dL (ref 7–25)
CO2: 25 mmol/L (ref 20–32)
Calcium: 8.9 mg/dL (ref 8.6–10.3)
Chloride: 98 mmol/L (ref 98–110)
Creat: 0.73 mg/dL (ref 0.70–1.18)
Glucose, Bld: 92 mg/dL (ref 65–99)
Potassium: 4.6 mmol/L (ref 3.5–5.3)
Sodium: 130 mmol/L — ABNORMAL LOW (ref 135–146)

## 2019-05-08 LAB — CK: Total CK: 76 U/L (ref 44–196)

## 2019-05-08 LAB — MAGNESIUM: Magnesium: 1.3 mg/dL — ABNORMAL LOW (ref 1.5–2.5)

## 2019-06-02 DIAGNOSIS — E119 Type 2 diabetes mellitus without complications: Secondary | ICD-10-CM | POA: Diagnosis not present

## 2019-06-28 DIAGNOSIS — H25043 Posterior subcapsular polar age-related cataract, bilateral: Secondary | ICD-10-CM | POA: Diagnosis not present

## 2019-06-28 DIAGNOSIS — H04123 Dry eye syndrome of bilateral lacrimal glands: Secondary | ICD-10-CM | POA: Diagnosis not present

## 2019-06-28 DIAGNOSIS — I10 Essential (primary) hypertension: Secondary | ICD-10-CM | POA: Diagnosis not present

## 2019-06-28 DIAGNOSIS — H2513 Age-related nuclear cataract, bilateral: Secondary | ICD-10-CM | POA: Diagnosis not present

## 2019-07-05 ENCOUNTER — Ambulatory Visit: Payer: Medicare Other | Admitting: Podiatry

## 2019-07-05 ENCOUNTER — Ambulatory Visit (INDEPENDENT_AMBULATORY_CARE_PROVIDER_SITE_OTHER): Payer: Medicare Other

## 2019-07-05 ENCOUNTER — Encounter: Payer: Self-pay | Admitting: Podiatry

## 2019-07-05 ENCOUNTER — Other Ambulatory Visit: Payer: Self-pay

## 2019-07-05 VITALS — Temp 97.2°F | Resp 16

## 2019-07-05 DIAGNOSIS — Q6671 Congenital pes cavus, right foot: Secondary | ICD-10-CM

## 2019-07-05 DIAGNOSIS — Q6672 Congenital pes cavus, left foot: Secondary | ICD-10-CM

## 2019-07-05 DIAGNOSIS — R269 Unspecified abnormalities of gait and mobility: Secondary | ICD-10-CM

## 2019-07-05 DIAGNOSIS — M456 Ankylosing spondylitis lumbar region: Secondary | ICD-10-CM

## 2019-07-05 DIAGNOSIS — E119 Type 2 diabetes mellitus without complications: Secondary | ICD-10-CM | POA: Diagnosis not present

## 2019-07-05 DIAGNOSIS — Q667 Congenital pes cavus, unspecified foot: Secondary | ICD-10-CM

## 2019-07-05 NOTE — Progress Notes (Signed)
CMFO note:  Cast for full length flex sport f/o to address pes cavus/RF varus.  Plan on f/o to hug arch, 46mm deep heel cup, 3* RF varus post.

## 2019-07-11 NOTE — Progress Notes (Signed)
Subjective:   Patient ID: Andrew Jones, male   DOB: 79 y.o.   MRN: [id number]   HPI 79 year old male presents the office today requesting orthotics.  He has no significant pain to his foot but he thinks that due to the way he is walking with his arches this is causing other issues including hip pain.  He did previously get over-the-counter inserts and did well with this for 6 weeks however they were out.  He wants to consider custom.  He believes that he has high arches which is contributing to the other issues. He states he walks like a "duck".  No weakness or falls.  He is diabetic and last A1c was 6.1.  No claudication symptoms.   Review of Systems  All other systems reviewed and are negative.  Past Medical History:  Diagnosis Date  . Ankylosing spondylitis ([place])    back, hips - on embrel 2000-2008, resumed 12/2012  . CVA (cerebral vascular accident) ([place]) 1999, 2002   no residual problems  . Diabetes mellitus, type 2 ([place])   . Dyslipidemia   . Hypertension   . Osteoarthritis   . Seizure disorder ([place]) 05/2009  . Sleep apnea    uses c-pap    Past Surgical History:  Procedure Laterality Date  . NO PAST SURGERIES       Current Outpatient Medications:  .  amLODipine (NORVASC) 10 MG tablet, Take 1 tablet (10 mg total) by mouth daily., Disp: , Rfl:  .  aspirin 81 MG tablet, Take 81 mg by mouth daily., Disp: , Rfl:  .  atorvastatin (LIPITOR) 40 MG tablet, Take 1 tablet (40 mg total) by mouth daily at 6 PM., Disp: 90 tablet, Rfl: 1 .  benazepril (LOTENSIN) 10 MG tablet, Take 10 mg by mouth daily., Disp: , Rfl:  .  cholecalciferol (VITAMIN D) 1000 UNITS tablet, Take 1,000 Units by mouth daily., Disp: , Rfl:  .  diclofenac (VOLTAREN) 75 MG EC tablet, Take 1 tablet (75 mg total) by mouth daily., Disp: 30 tablet, Rfl: 5 .  etanercept (ENBREL) 50 MG/ML injection, Inject 50 mg into the skin once a week., Disp: , Rfl:  .  fish oil-omega-3 fatty acids 1000 MG capsule, Take 2 g by mouth  daily., Disp: , Rfl:  .  Magnesium Oxide 420 MG TABS, Take 1 tablet (420 mg total) by mouth daily after breakfast., Disp: , Rfl: 0 .  metFORMIN (GLUCOPHAGE) 1000 MG tablet, Take 1,000 mg by mouth 2 (two) times daily with a meal., Disp: , Rfl:  .  metoprolol tartrate (LOPRESSOR) 25 MG tablet, Take 25 mg by mouth 2 (two) times daily., Disp: , Rfl:  .  Multiple Vitamin (MULTIVITAMIN) tablet, Take 1 tablet by mouth daily., Disp: , Rfl:  .  omeprazole (PRILOSEC) 20 MG capsule, Take 20 mg by mouth daily., Disp: , Rfl:  .  phenytoin (DILANTIN) 100 MG ER capsule, Take 100 mg by mouth. 1 capsule in morning and 2 at night, Disp: , Rfl:  .  pioglitazone (ACTOS) 15 MG tablet, Take 1 tablet (15 mg total) by mouth daily., Disp: , Rfl:  .  sulfaSALAzine (AZULFIDINE) 500 MG tablet, Take 1 tablet (500 mg total) by mouth 3 (three) times daily., Disp: , Rfl:   No Known Allergies       Objective:  Physical Exam  General: AAO x3, NAD  Dermatological: Skin is warm, dry and supple bilateral. Nails x 10 are well manicured; remaining integument appears unremarkable at this time.  There are no open sores, no preulcerative lesions, no rash or signs of infection present.  Vascular: Dorsalis Pedis artery and Posterior Tibial artery pedal pulses are 2/4 bilateral with immedate capillary fill time.  There is no pain with calf compression, swelling, warmth, erythema.   Neruologic: Grossly intact via light touch bilateral.   Musculoskeletal: Mild increase in medial arch height.  There is no area of tenderness identified today there is no edema, erythema bilaterally.  Flexor, extensor tendons appear to be intact.  Muscular strength 5/5 in all groups tested bilateral.  Gait: Unassisted, Nonantalgic.       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  Old tib-fib fracture on the left side evident.  No evidence of acute fracture. -He was to proceed with custom orthotics.  Rick evaluate him today and he was measured for inserts.  Discussed with him that I have not treated his hip and back pain but he is to follow-up with orthopedics/rheumatology for this.  He feels that if he can get orthotics this will help with his other discomfort. -From a diabetes standpoint seems to be doing well.  Discussed daily foot inspection.    Trula Slade DPM

## 2019-07-15 ENCOUNTER — Ambulatory Visit: Payer: Medicare Other | Admitting: Podiatry

## 2019-07-17 ENCOUNTER — Other Ambulatory Visit: Payer: Self-pay | Admitting: Nurse Practitioner

## 2019-07-17 DIAGNOSIS — E785 Hyperlipidemia, unspecified: Secondary | ICD-10-CM

## 2019-07-20 ENCOUNTER — Ambulatory Visit: Payer: Medicare Other | Admitting: Orthotics

## 2019-07-20 ENCOUNTER — Other Ambulatory Visit: Payer: Self-pay

## 2019-07-20 ENCOUNTER — Ambulatory Visit: Payer: Medicare Other | Admitting: Podiatry

## 2019-07-20 DIAGNOSIS — Q667 Congenital pes cavus, unspecified foot: Secondary | ICD-10-CM

## 2019-07-20 DIAGNOSIS — R269 Unspecified abnormalities of gait and mobility: Secondary | ICD-10-CM

## 2019-07-20 NOTE — Progress Notes (Signed)
Patient came in today to pick up custom made foot orthotics.  The goals were accomplished and the patient reported no dissatisfaction with said orthotics.  Patient was advised of breakin period and how to report any issues. 

## 2019-07-27 DIAGNOSIS — H25011 Cortical age-related cataract, right eye: Secondary | ICD-10-CM | POA: Diagnosis not present

## 2019-07-27 DIAGNOSIS — H2512 Age-related nuclear cataract, left eye: Secondary | ICD-10-CM | POA: Diagnosis not present

## 2019-07-27 DIAGNOSIS — H25041 Posterior subcapsular polar age-related cataract, right eye: Secondary | ICD-10-CM | POA: Diagnosis not present

## 2019-07-27 DIAGNOSIS — H2511 Age-related nuclear cataract, right eye: Secondary | ICD-10-CM | POA: Diagnosis not present

## 2019-08-06 DIAGNOSIS — H2512 Age-related nuclear cataract, left eye: Secondary | ICD-10-CM | POA: Diagnosis not present

## 2019-08-10 DIAGNOSIS — H2511 Age-related nuclear cataract, right eye: Secondary | ICD-10-CM | POA: Diagnosis not present

## 2019-10-24 ENCOUNTER — Encounter: Payer: Self-pay | Admitting: Nurse Practitioner

## 2019-11-01 ENCOUNTER — Encounter (HOSPITAL_COMMUNITY): Payer: Self-pay | Admitting: Emergency Medicine

## 2019-11-01 ENCOUNTER — Other Ambulatory Visit: Payer: Self-pay

## 2019-11-01 ENCOUNTER — Emergency Department (HOSPITAL_COMMUNITY)
Admission: EM | Admit: 2019-11-01 | Discharge: 2019-11-02 | Disposition: A | Discharge status: Home / Self Care | Payer: Medicare Other | Attending: Emergency Medicine | Admitting: Emergency Medicine

## 2019-11-01 DIAGNOSIS — R109 Unspecified abdominal pain: Secondary | ICD-10-CM | POA: Insufficient documentation

## 2019-11-01 DIAGNOSIS — R0789 Other chest pain: Secondary | ICD-10-CM | POA: Diagnosis not present

## 2019-11-01 DIAGNOSIS — R42 Dizziness and giddiness: Secondary | ICD-10-CM | POA: Diagnosis not present

## 2019-11-01 DIAGNOSIS — R079 Chest pain, unspecified: Secondary | ICD-10-CM | POA: Diagnosis not present

## 2019-11-01 DIAGNOSIS — Z5321 Procedure and treatment not carried out due to patient leaving prior to being seen by health care provider: Secondary | ICD-10-CM | POA: Diagnosis not present

## 2019-11-01 DIAGNOSIS — I959 Hypotension, unspecified: Secondary | ICD-10-CM | POA: Diagnosis not present

## 2019-11-01 LAB — CBC
HCT: 36.6 % — ABNORMAL LOW (ref 39.0–52.0)
Hemoglobin: 12 g/dL — ABNORMAL LOW (ref 13.0–17.0)
MCH: 28.9 pg (ref 26.0–34.0)
MCHC: 32.8 g/dL (ref 30.0–36.0)
MCV: 88.2 fL (ref 80.0–100.0)
Platelets: 261 10*3/uL (ref 150–400)
RBC: 4.15 MIL/uL — ABNORMAL LOW (ref 4.22–5.81)
RDW: 12.9 % (ref 11.5–15.5)
WBC: 15.9 10*3/uL — ABNORMAL HIGH (ref 4.0–10.5)
nRBC: 0 % (ref 0.0–0.2)

## 2019-11-01 LAB — COMPREHENSIVE METABOLIC PANEL
ALT: 84 U/L — ABNORMAL HIGH (ref 0–44)
AST: 137 U/L — ABNORMAL HIGH (ref 15–41)
Albumin: 3.7 g/dL (ref 3.5–5.0)
Alkaline Phosphatase: 73 U/L (ref 38–126)
Anion gap: 11 (ref 5–15)
BUN: 21 mg/dL (ref 8–23)
CO2: 26 mmol/L (ref 22–32)
Calcium: 9 mg/dL (ref 8.9–10.3)
Chloride: 98 mmol/L (ref 98–111)
Creatinine, Ser: 0.89 mg/dL (ref 0.61–1.24)
GFR calc Af Amer: >60 mL/min (ref >60)
GFR calc non Af Amer: >60 mL/min (ref >60)
Glucose, Bld: 258 mg/dL — ABNORMAL HIGH (ref 70–99)
Potassium: 4.4 mmol/L (ref 3.5–5.1)
Sodium: 135 mmol/L (ref 135–145)
Total Bilirubin: 0.9 mg/dL (ref 0.3–1.2)
Total Protein: 6.2 g/dL — ABNORMAL LOW (ref 6.5–8.1)

## 2019-11-01 LAB — URINALYSIS, ROUTINE W REFLEX MICROSCOPIC
Bilirubin Urine: NEGATIVE
Glucose, UA: NEGATIVE mg/dL
Hgb urine dipstick: NEGATIVE
Ketones, ur: NEGATIVE mg/dL
Leukocytes,Ua: NEGATIVE
Nitrite: NEGATIVE
Protein, ur: NEGATIVE mg/dL
Specific Gravity, Urine: 1.018 (ref 1.005–1.030)
pH: 6 (ref 5.0–8.0)

## 2019-11-01 LAB — LIPASE, BLOOD: Lipase: 44 U/L (ref 11–51)

## 2019-11-01 MED ORDER — SODIUM CHLORIDE 0.9% FLUSH: 3.0000 mL | Freq: Once | INTRAVENOUS | Status: DC

## 2019-11-01 NOTE — ED Triage Notes (Signed)
Patient arrived with EMS from home reports mid abdominal pain this evening , denies emesis or diaphoresis , no fever or chills . CBG= 155.

## 2019-11-02 ENCOUNTER — Encounter: Payer: Self-pay | Admitting: Nurse Practitioner

## 2019-11-02 ENCOUNTER — Ambulatory Visit: Payer: Medicare Other | Admitting: Nurse Practitioner

## 2019-11-02 ENCOUNTER — Other Ambulatory Visit: Payer: Self-pay

## 2019-11-02 VITALS — BP 138/62 | HR 76 | Temp 97.6°F | Ht 67.0 in | Wt 186.4 lb

## 2019-11-02 DIAGNOSIS — D72829 Elevated white blood cell count, unspecified: Secondary | ICD-10-CM

## 2019-11-02 DIAGNOSIS — R1033 Periumbilical pain: Secondary | ICD-10-CM

## 2019-11-02 NOTE — Patient Instructions (Signed)
Maintain low fat diet, avoid meat intake You will be contacted to schedule appt for ABD [place].  Abdominal Pain, Adult Many things can cause belly (abdominal) pain. Most times, belly pain is not dangerous. Many cases of belly pain can be watched and treated at home. Sometimes, though, belly pain is serious. Your doctor will try to find the cause of your belly pain. Follow these instructions at home:  Medicines  Take over-the-counter and prescription medicines only as told by your doctor.  Do not take medicines that help you poop (laxatives) unless told by your doctor. General instructions  Watch your belly pain for any changes.  Drink enough fluid to keep your pee (urine) pale yellow.  Keep all follow-up visits as told by your doctor. This is important. Contact a doctor if:  Your belly pain changes or gets worse.  You are not hungry, or you lose weight without trying.  You are having trouble pooping (constipated) or have watery poop (diarrhea) for more than 2-3 days.  You have pain when you pee or poop.  Your belly pain wakes you up at night.  Your pain gets worse with meals, after eating, or with certain foods.  You are vomiting and cannot keep anything down.  You have a fever.  You have blood in your pee. Get help right away if:  Your pain does not go away as soon as your doctor says it should.  You cannot stop vomiting.  Your pain is only in areas of your belly, such as the right side or the left lower part of the belly.  You have bloody or black poop, or poop that looks like tar.  You have very bad pain, cramping, or bloating in your belly.  You have signs of not having enough fluid or water in your body (dehydration), such as: ? Dark pee, very little pee, or no pee. ? Cracked lips. ? Dry mouth. ? Sunken eyes. ? Sleepiness. ? Weakness.  You have trouble breathing or chest pain. Summary  Many cases of belly pain can be watched and treated at home.  Watch  your belly pain for any changes.  Take over-the-counter and prescription medicines only as told by your doctor.  Contact a doctor if your belly pain changes or gets worse.  Get help right away if you have very bad pain, cramping, or bloating in your belly. This information is not intended to replace advice given to you by your health care provider. Make sure you discuss any questions you have with your health care provider. Document Revised: 07/20/2018 Document Reviewed: 07/20/2018 Elsevier Patient Education  [employer].

## 2019-11-02 NOTE — ED Notes (Signed)
Patient daughter in law states she is taking patient home so he can lay down

## 2019-11-02 NOTE — Progress Notes (Signed)
Subjective:  Patient ID: Andrew Jones, male    DOB: 14-Feb-1941  Age: 79 y.o. MRN: [id number]  CC: Abdominal Pain (an off labs-possible gallbladder issue-ED last night waited for hours and never seen anyone-pain in lower abdominal for last few days)  Abdominal Pain This is a new problem. The current episode started yesterday. The onset quality is sudden. The problem occurs constantly. The problem has been gradually improving. The pain is located in the periumbilical region. The pain is moderate. The quality of the pain is a sensation of fullness and cramping. The abdominal pain does not radiate. Associated symptoms include anorexia and nausea. Pertinent negatives include no constipation, diarrhea, dysuria, fever, frequency, headaches, hematochezia, hematuria, melena, myalgias or vomiting. The pain is aggravated by eating. The pain is relieved by nothing. He has tried nothing for the symptoms. There is no history of abdominal surgery, colon cancer, gallstones, GERD, irritable bowel syndrome, pancreatitis or PUD.   Accompanied by his son Onset after eating cheese quesidia last night. CBC indicates leukocytosis with normal LFT and lipase. Normal BM and urination  No problem-specific Assessment & Plan notes found for this encounter.   Reviewed past Medical, Social and Family history today.  Outpatient Medications Prior to Visit  Medication Sig Dispense Refill   amLODipine (NORVASC) 10 MG tablet Take 1 tablet (10 mg total) by mouth daily.     aspirin 81 MG tablet Take 81 mg by mouth daily.     atorvastatin (LIPITOR) 40 MG tablet TAKE 1 TABLET BY MOUTH EVERY DAY AT 6 PM **STOP TAKING PRAVASTATIN** 90 tablet 1   benazepril (LOTENSIN) 10 MG tablet Take 10 mg by mouth daily.     cholecalciferol (VITAMIN D) 1000 UNITS tablet Take 1,000 Units by mouth daily.     diclofenac (VOLTAREN) 75 MG EC tablet Take 1 tablet (75 mg total) by mouth daily. 30 tablet 5   etanercept (ENBREL) 50 MG/ML  injection Inject 50 mg into the skin once a week.     fish oil-omega-3 fatty acids 1000 MG capsule Take 2 g by mouth daily.     Magnesium Oxide 420 MG TABS Take 1 tablet (420 mg total) by mouth daily after breakfast.  0   metFORMIN (GLUCOPHAGE) 1000 MG tablet Take 1,000 mg by mouth 2 (two) times daily with a meal.     metoprolol tartrate (LOPRESSOR) 25 MG tablet Take 25 mg by mouth 2 (two) times daily.     Multiple Vitamin (MULTIVITAMIN) tablet Take 1 tablet by mouth daily.     omeprazole (PRILOSEC) 20 MG capsule Take 20 mg by mouth daily.     phenytoin (DILANTIN) 100 MG ER capsule Take 100 mg by mouth. 1 capsule in morning and 2 at night     pioglitazone (ACTOS) 15 MG tablet Take 1 tablet (15 mg total) by mouth daily.     sulfaSALAzine (AZULFIDINE) 500 MG tablet Take 1 tablet (500 mg total) by mouth 3 (three) times daily.     No facility-administered medications prior to visit.    ROS See HPI  Objective:  BP 138/62    Pulse 76    Temp 97.6 F (36.4 C) (Tympanic)    Ht 5\' 7"  (1.702 m)    Wt 186 lb 6.4 oz (84.6 kg)    SpO2 98%    BMI 29.19 kg/m   Physical Exam Constitutional:      General: He is not in acute distress.    Appearance: He is obese. He is not  ill-appearing.  Abdominal:     General: Bowel sounds are normal. There is no distension or abdominal bruit.     Palpations: Abdomen is soft.     Tenderness: There is no abdominal tenderness.  Neurological:     Mental Status: He is alert.    Assessment & Plan:  This visit occurred during the SARS-CoV-2 public health emergency.  Safety protocols were in place, including screening questions prior to the visit, additional usage of staff PPE, and extensive cleaning of exam room while observing appropriate contact time as indicated for disinfecting solutions.   Andrew Jones was seen today for abdominal pain.  Diagnoses and all orders for this visit:  Periumbilical abdominal pain -     US Abdomen Complete; Future  Leukocytosis,  unspecified type -     US Abdomen Complete; Future  Normal ABD [place], repeat CBC indicates resolved leukocytosis, repeat CMP indicates persistent elevated LFTs. Ordered hepatitis panel and CT ABD/pelvis.  Problem List Items Addressed This Visit    None    Visit Diagnoses    Periumbilical abdominal pain    -  Primary   Relevant Orders   US Abdomen Complete (Completed)   Leukocytosis, unspecified type       Relevant Orders   US Abdomen Complete (Completed)      I have spent 35mins with this patient regarding history taking, documentation, review of ED labs results, formulating plan and discussing treatment options with patient.  Follow-up: No follow-ups on file.  Wilfred Lacy, NP

## 2019-11-03 ENCOUNTER — Ambulatory Visit (HOSPITAL_BASED_OUTPATIENT_CLINIC_OR_DEPARTMENT_OTHER)
Admission: RE | Admit: 2019-11-03 | Discharge: 2019-11-03 | Disposition: A | Discharge status: Home / Self Care | Payer: Medicare Other | Source: Ambulatory Visit | Attending: Nurse Practitioner | Admitting: Nurse Practitioner

## 2019-11-03 DIAGNOSIS — R1033 Periumbilical pain: Secondary | ICD-10-CM | POA: Insufficient documentation

## 2019-11-03 DIAGNOSIS — D72829 Elevated white blood cell count, unspecified: Secondary | ICD-10-CM | POA: Diagnosis not present

## 2019-11-03 DIAGNOSIS — K824 Cholesterolosis of gallbladder: Secondary | ICD-10-CM | POA: Diagnosis not present

## 2019-11-03 DIAGNOSIS — I714 Abdominal aortic aneurysm, without rupture: Secondary | ICD-10-CM | POA: Diagnosis not present

## 2019-11-03 DIAGNOSIS — I700 Atherosclerosis of aorta: Secondary | ICD-10-CM | POA: Diagnosis not present

## 2019-11-04 ENCOUNTER — Other Ambulatory Visit: Payer: Self-pay

## 2019-11-04 NOTE — Telephone Encounter (Signed)
-----   Message from Lucila [place], [place] sent at 11/03/2019  4:21 PM EDT ----- Pt was notified and verbally understood results.  Andrew Jones, pt stated he has had no symptoms since the ER. Pt said hes willing to do anything else you need him to do if need be.

## 2019-11-05 ENCOUNTER — Other Ambulatory Visit (INDEPENDENT_AMBULATORY_CARE_PROVIDER_SITE_OTHER): Payer: Medicare Other

## 2019-11-05 DIAGNOSIS — D72829 Elevated white blood cell count, unspecified: Secondary | ICD-10-CM | POA: Diagnosis not present

## 2019-11-05 DIAGNOSIS — R1033 Periumbilical pain: Secondary | ICD-10-CM

## 2019-11-05 DIAGNOSIS — R7989 Other specified abnormal findings of blood chemistry: Secondary | ICD-10-CM

## 2019-11-05 LAB — COMPREHENSIVE METABOLIC PANEL
ALT: 158 U/L — ABNORMAL HIGH (ref 0–53)
AST: 47 U/L — ABNORMAL HIGH (ref 0–37)
Albumin: 4.2 g/dL (ref 3.5–5.2)
Alkaline Phosphatase: 87 U/L (ref 39–117)
BUN: 19 mg/dL (ref 6–23)
CO2: 26 mEq/L (ref 19–32)
Calcium: 9.4 mg/dL (ref 8.4–10.5)
Chloride: 96 mEq/L (ref 96–112)
Creatinine, Ser: 0.92 mg/dL (ref 0.40–1.50)
GFR: 79.82 mL/min (ref >60.00)
Glucose, Bld: 132 mg/dL — ABNORMAL HIGH (ref 70–99)
Potassium: 5.2 mEq/L — ABNORMAL HIGH (ref 3.5–5.1)
Sodium: 130 mEq/L — ABNORMAL LOW (ref 135–145)
Total Bilirubin: 0.5 mg/dL (ref 0.2–1.2)
Total Protein: 6.6 g/dL (ref 6.0–8.3)

## 2019-11-05 LAB — CBC WITH DIFFERENTIAL/PLATELET
Basophils Absolute: 0.1 10*3/uL (ref 0.0–0.1)
Basophils Relative: 0.9 % (ref 0.0–3.0)
Eosinophils Absolute: 0.3 10*3/uL (ref 0.0–0.7)
Eosinophils Relative: 3.3 % (ref 0.0–5.0)
HCT: 36.4 % — ABNORMAL LOW (ref 39.0–52.0)
Hemoglobin: 12.5 g/dL — ABNORMAL LOW (ref 13.0–17.0)
Lymphocytes Relative: 26.3 % (ref 12.0–46.0)
Lymphs Abs: 2.8 10*3/uL (ref 0.7–4.0)
MCHC: 34.4 g/dL (ref 30.0–36.0)
MCV: 85.8 fl (ref 78.0–100.0)
Monocytes Absolute: 1.2 10*3/uL — ABNORMAL HIGH (ref 0.1–1.0)
Monocytes Relative: 11.5 % (ref 3.0–12.0)
Neutro Abs: 6.1 10*3/uL (ref 1.4–7.7)
Neutrophils Relative %: 58 % (ref 43.0–77.0)
Platelets: 252 10*3/uL (ref 150.0–400.0)
RBC: 4.25 Mil/uL (ref 4.22–5.81)
RDW: 13.3 % (ref 11.5–15.5)
WBC: 10.5 10*3/uL (ref 4.0–10.5)

## 2019-11-05 NOTE — Addendum Note (Signed)
Addended by: Leana Gamer on: 11/05/2019 02:39 PM   Modules accepted: Orders

## 2019-11-05 NOTE — Addendum Note (Signed)
Addended by: Leana Gamer on: 11/05/2019 02:37 PM   Modules accepted: Orders

## 2019-11-09 ENCOUNTER — Encounter: Payer: Self-pay | Admitting: Nurse Practitioner

## 2019-11-09 ENCOUNTER — Other Ambulatory Visit: Payer: Self-pay

## 2019-11-10 ENCOUNTER — Other Ambulatory Visit (INDEPENDENT_AMBULATORY_CARE_PROVIDER_SITE_OTHER): Payer: Medicare Other

## 2019-11-10 DIAGNOSIS — R1033 Periumbilical pain: Secondary | ICD-10-CM

## 2019-11-10 DIAGNOSIS — R7989 Other specified abnormal findings of blood chemistry: Secondary | ICD-10-CM | POA: Diagnosis not present

## 2019-11-11 ENCOUNTER — Encounter: Payer: Self-pay | Admitting: Nurse Practitioner

## 2019-11-11 LAB — HEPATITIS PANEL, ACUTE
Hep A IgM: NONREACTIVE
Hep B C IgM: NONREACTIVE
Hepatitis B Surface Ag: NONREACTIVE
Hepatitis C Ab: NONREACTIVE
SIGNAL TO CUT-OFF: 0.01 (ref <1.00)

## 2019-11-17 ENCOUNTER — Other Ambulatory Visit: Payer: Self-pay

## 2019-11-17 ENCOUNTER — Ambulatory Visit (HOSPITAL_BASED_OUTPATIENT_CLINIC_OR_DEPARTMENT_OTHER)
Admission: RE | Admit: 2019-11-17 | Discharge: 2019-11-17 | Disposition: A | Discharge status: Home / Self Care | Payer: Medicare Other | Source: Ambulatory Visit | Attending: Nurse Practitioner | Admitting: Nurse Practitioner

## 2019-11-17 DIAGNOSIS — R1033 Periumbilical pain: Secondary | ICD-10-CM | POA: Diagnosis not present

## 2019-11-17 DIAGNOSIS — R7989 Other specified abnormal findings of blood chemistry: Secondary | ICD-10-CM | POA: Insufficient documentation

## 2019-11-17 DIAGNOSIS — M47816 Spondylosis without myelopathy or radiculopathy, lumbar region: Secondary | ICD-10-CM | POA: Diagnosis not present

## 2019-11-17 DIAGNOSIS — R509 Fever, unspecified: Secondary | ICD-10-CM | POA: Diagnosis not present

## 2019-11-17 DIAGNOSIS — R109 Unspecified abdominal pain: Secondary | ICD-10-CM | POA: Diagnosis not present

## 2019-11-17 DIAGNOSIS — I700 Atherosclerosis of aorta: Secondary | ICD-10-CM | POA: Diagnosis not present

## 2019-11-17 MED ORDER — IOHEXOL 300 MG/ML  SOLN
100.0000 mL | Freq: Once | INTRAMUSCULAR | Status: AC | PRN
  Administered 2019-11-17: 100 mL via INTRAVENOUS

## 2019-11-18 ENCOUNTER — Encounter: Payer: Self-pay | Admitting: Nurse Practitioner

## 2019-11-19 ENCOUNTER — Encounter: Payer: Self-pay | Admitting: Nurse Practitioner

## 2019-11-22 ENCOUNTER — Ambulatory Visit (HOSPITAL_COMMUNITY): Payer: Medicare Other

## 2019-12-08 ENCOUNTER — Encounter: Payer: Self-pay | Admitting: Nurse Practitioner

## 2019-12-09 ENCOUNTER — Ambulatory Visit (INDEPENDENT_AMBULATORY_CARE_PROVIDER_SITE_OTHER): Payer: Medicare Other

## 2019-12-09 VITALS — Ht 67.0 in | Wt 178.0 lb

## 2019-12-09 DIAGNOSIS — Z0000 Encounter for general adult medical examination without abnormal findings: Secondary | ICD-10-CM

## 2019-12-09 NOTE — Patient Instructions (Signed)
Andrew Jones , Thank you for taking time to complete your Medicare Wellness Visit. I appreciate your ongoing commitment to your health goals. Please review the following plan we discussed and let me know if I can assist you in the future.   Screening recommendations/referrals: Colonoscopy: No longer indicated Recommended yearly ophthalmology/optometry visit for glaucoma screening and checkup Recommended yearly dental visit for hygiene and checkup  Vaccinations: Influenza vaccine: Due- May obtain vaccine at our office or your local pharmacy Pneumococcal vaccine: Completed vaccines Tdap vaccine: Up to Date-Due-09/15/2021 Shingles vaccine: Discuss with pharmacy  Covid-19: Completed vaccines  Advanced directives: Please bring a copy for your chart.  Conditions/risks identified: See problem list  Next appointment: Follow up in one year for your annual wellness visit.   Preventive Care 77 Years and Older, Male Preventive care refers to lifestyle choices and visits with your health care provider that can promote health and wellness. What does preventive care include?  A yearly physical exam. This is also called an annual well check.  Dental exams once or twice a year.  Routine eye exams. Ask your health care provider how often you should have your eyes checked.  Personal lifestyle choices, including:  Daily care of your teeth and gums.  Regular physical activity.  Eating a healthy diet.  Avoiding tobacco and drug use.  Limiting alcohol use.  Practicing safe sex.  Taking low doses of aspirin every day.  Taking vitamin and mineral supplements as recommended by your health care provider. What happens during an annual well check? The services and screenings done by your health care provider during your annual well check will depend on your age, overall health, lifestyle risk factors, and family history of disease. Counseling  Your health care provider may ask you questions about  your:  Alcohol use.  Tobacco use.  Drug use.  Emotional well-being.  Home and relationship well-being.  Sexual activity.  Eating habits.  History of falls.  Memory and ability to understand (cognition).  Work and work Statistician. Screening  You may have the following tests or measurements:  Height, weight, and BMI.  Blood pressure.  Lipid and cholesterol levels. These may be checked every 5 years, or more frequently if you are over 38 years old.  Skin check.  Lung cancer screening. You may have this screening every year starting at age 77 if you have a 30-pack-year history of smoking and currently smoke or have quit within the past 15 years.  Fecal occult blood test (FOBT) of the stool. You may have this test every year starting at age 77.  Flexible sigmoidoscopy or colonoscopy. You may have a sigmoidoscopy every 5 years or a colonoscopy every 10 years starting at age 77.  Prostate cancer screening. Recommendations will vary depending on your family history and other risks.  Hepatitis C blood test.  Hepatitis B blood test.  Sexually transmitted disease (STD) testing.  Diabetes screening. This is done by checking your blood sugar (glucose) after you have not eaten for a while (fasting). You may have this done every 1-3 years.  Abdominal aortic aneurysm (AAA) screening. You may need this if you are a current or former smoker.  Osteoporosis. You may be screened starting at age 77 if if you are at high risk. Talk with your health care provider about your test results, treatment options, and if necessary, the need for more tests. Vaccines  Your health care provider may recommend certain vaccines, such as:  Influenza vaccine. This is recommended every year.  Tetanus, diphtheria, and acellular pertussis (Tdap, Td) vaccine. You may need a Td booster every 10 years.  Zoster vaccine. You may need this after age 77.  Pneumococcal 13-valent conjugate (PCV13) vaccine.  One dose is recommended after age 77.  Pneumococcal polysaccharide (PPSV23) vaccine. One dose is recommended after age 77. Talk to your health care provider about which screenings and vaccines you need and how often you need them. This information is not intended to replace advice given to you by your health care provider. Make sure you discuss any questions you have with your health care provider. Document Released: 04/07/2015 Document Revised: 11/29/2015 Document Reviewed: 01/10/2015 Elsevier Interactive Patient Education  2017 [place] Prevention in the Home Falls can cause injuries. They can happen to people of all ages. There are many things you can do to make your home safe and to help prevent falls. What can I do on the outside of my home?  Regularly fix the edges of walkways and driveways and fix any cracks.  Remove anything that might make you trip as you walk through a door, such as a raised step or threshold.  Trim any bushes or trees on the path to your home.  Use bright outdoor lighting.  Clear any walking paths of anything that might make someone trip, such as rocks or tools.  Regularly check to see if handrails are loose or broken. Make sure that both sides of any steps have handrails.  Any raised decks and porches should have guardrails on the edges.  Have any leaves, snow, or ice cleared regularly.  Use sand or salt on walking paths during winter.  Clean up any spills in your garage right away. This includes oil or grease spills. What can I do in the bathroom?  Use night lights.  Install grab bars by the toilet and in the tub and shower. Do not use towel bars as grab bars.  Use non-skid mats or decals in the tub or shower.  If you need to sit down in the shower, use a plastic, non-slip stool.  Keep the floor dry. Clean up any water that spills on the floor as soon as it happens.  Remove soap buildup in the tub or shower regularly.  Attach bath  mats securely with double-sided non-slip rug tape.  Do not have throw rugs and other things on the floor that can make you trip. What can I do in the bedroom?  Use night lights.  Make sure that you have a light by your bed that is easy to reach.  Do not use any sheets or blankets that are too big for your bed. They should not hang down onto the floor.  Have a firm chair that has side arms. You can use this for support while you get dressed.  Do not have throw rugs and other things on the floor that can make you trip. What can I do in the kitchen?  Clean up any spills right away.  Avoid walking on wet floors.  Keep items that you use a lot in easy-to-reach places.  If you need to reach something above you, use a strong step stool that has a grab bar.  Keep electrical cords out of the way.  Do not use floor polish or wax that makes floors slippery. If you must use wax, use non-skid floor wax.  Do not have throw rugs and other things on the floor that can make you trip. What can I do  with my stairs?  Do not leave any items on the stairs.  Make sure that there are handrails on both sides of the stairs and use them. Fix handrails that are broken or loose. Make sure that handrails are as long as the stairways.  Check any carpeting to make sure that it is firmly attached to the stairs. Fix any carpet that is loose or worn.  Avoid having throw rugs at the top or bottom of the stairs. If you do have throw rugs, attach them to the floor with carpet tape.  Make sure that you have a light switch at the top of the stairs and the bottom of the stairs. If you do not have them, ask someone to add them for you. What else can I do to help prevent falls?  Wear shoes that:  Do not have high heels.  Have rubber bottoms.  Are comfortable and fit you well.  Are closed at the toe. Do not wear sandals.  If you use a stepladder:  Make sure that it is fully opened. Do not climb a closed  stepladder.  Make sure that both sides of the stepladder are locked into place.  Ask someone to hold it for you, if possible.  Clearly mark and make sure that you can see:  Any grab bars or handrails.  First and last steps.  Where the edge of each step is.  Use tools that help you move around (mobility aids) if they are needed. These include:  Canes.  Walkers.  Scooters.  Crutches.  Turn on the lights when you go into a dark area. Replace any light bulbs as soon as they burn out.  Set up your furniture so you have a clear path. Avoid moving your furniture around.  If any of your floors are uneven, fix them.  If there are any pets around you, be aware of where they are.  Review your medicines with your doctor. Some medicines can make you feel dizzy. This can increase your chance of falling. Ask your doctor what other things that you can do to help prevent falls. This information is not intended to replace advice given to you by your health care provider. Make sure you discuss any questions you have with your health care provider. Document Released: 01/05/2009 Document Revised: 08/17/2015 Document Reviewed: 04/15/2014 Elsevier Interactive Patient Education  2017 [employer].

## 2019-12-09 NOTE — Progress Notes (Signed)
Subjective:   Andrew Jones is a 79 y.o. male who presents for Medicare Annual/Subsequent preventive examination.  I connected with Andrew Jones today by telephone and verified that I am speaking with the correct person using two identifiers. Location patient: home Location provider: work Persons participating in the virtual visit: patient, Marine scientist.    I discussed the limitations, risks, security and privacy concerns of performing an evaluation and management service by telephone and the availability of in person appointments. I also discussed with the patient that there may be a patient responsible charge related to this service. The patient expressed understanding and verbally consented to this telephonic visit.    Interactive audio and video telecommunications were attempted between this provider and patient, however failed, due to patient having technical difficulties OR patient did not have access to video capability.  We continued and completed visit with audio only.  Some vital signs may be absent or patient reported.   Time Spent with patient on telephone encounter: 20 minutes  Review of Systems     Cardiac Risk Factors include: advanced age (>44men, >3 women);diabetes mellitus;hypertension;dyslipidemia;male gender     Objective:    Today's Vitals   12/09/19 1543  Weight: 178 lb (80.7 kg)  Height: 5\' 7"  (1.702 m)   Body mass index is 27.88 kg/m.  Advanced Directives 12/09/2019 11/01/2019 12/20/2014 12/07/2014  Does Patient Have a Medical Advance Directive? Yes No No No;Yes  Type of Paramedic of [place];Living will - Living will;Healthcare Power of [place];Living will  Copy of [place] in Chart? No - copy requested - Yes No - copy requested  Would patient like information on creating a medical advance directive? - No - Patient declined No - patient declined information -    Current Medications  (verified) Outpatient Encounter Medications as of 12/09/2019  Medication Sig  . amLODipine (NORVASC) 10 MG tablet Take 1 tablet (10 mg total) by mouth daily.  Marland Kitchen aspirin 81 MG tablet Take 81 mg by mouth daily.  . benazepril (LOTENSIN) 10 MG tablet Take 10 mg by mouth daily.  . cholecalciferol (VITAMIN D) 1000 UNITS tablet Take 1,000 Units by mouth daily.  . diclofenac (VOLTAREN) 75 MG EC tablet Take 1 tablet (75 mg total) by mouth daily.  Marland Kitchen etanercept (ENBREL) 50 MG/ML injection Inject 50 mg into the skin once a week.  . fish oil-omega-3 fatty acids 1000 MG capsule Take 2 g by mouth daily.  . Magnesium Oxide 420 MG TABS Take 1 tablet (420 mg total) by mouth daily after breakfast.  . metFORMIN (GLUCOPHAGE) 1000 MG tablet Take 1,000 mg by mouth 2 (two) times daily with a meal.  . metoprolol tartrate (LOPRESSOR) 25 MG tablet Take 25 mg by mouth 2 (two) times daily.  . Multiple Vitamin (MULTIVITAMIN) tablet Take 1 tablet by mouth daily.  Marland Kitchen omeprazole (PRILOSEC) 20 MG capsule Take 20 mg by mouth daily.  . phenytoin (DILANTIN) 100 MG ER capsule Take 100 mg by mouth. 1 capsule in morning and 2 at night  . pioglitazone (ACTOS) 15 MG tablet Take 1 tablet (15 mg total) by mouth daily.  Marland Kitchen sulfaSALAzine (AZULFIDINE) 500 MG tablet Take 1 tablet (500 mg total) by mouth 3 (three) times daily.  Marland Kitchen atorvastatin (LIPITOR) 40 MG tablet TAKE 1 TABLET BY MOUTH EVERY DAY AT 6 PM **STOP TAKING PRAVASTATIN** (Patient not taking: Reported on 12/09/2019)   No facility-administered encounter medications on file as of 12/09/2019.  Allergies (verified) Patient has no known allergies.   History: Past Medical History:  Diagnosis Date  . Ankylosing spondylitis ([place])    back, hips - on embrel 2000-2008, resumed 12/2012  . CVA (cerebral vascular accident) ([place]) 1999, 2002   no residual problems  . Diabetes mellitus, type 2 ([place])   . Dyslipidemia   . Hypertension   . Osteoarthritis   . Seizure disorder ([place]) 05/2009   . Sleep apnea    uses c-pap   Past Surgical History:  Procedure Laterality Date  . NO PAST SURGERIES     Family History  Problem Relation Age of Onset  . Heart failure Mother   . Heart disease Mother   . COPD Father   . Prostate cancer Brother    Social History   Socioeconomic History  . Marital status: Married    Spouse name: Not on file  . Number of children: 1  . Years of education: 44  . Highest education level: Not on file  Occupational History  . Occupation: Retired  Tobacco Use  . Smoking status: Former Research scientist (life sciences)  . Smokeless tobacco: Never Used  . Tobacco comment: 30 yrs ago  Vaping Use  . Vaping Use: Never used  Substance and Sexual Activity  . Alcohol use: Not Currently  . Drug use: No  . Sexual activity: Not on file  Other Topics Concern  . Not on file  Social History Narrative   Patient is married with one son.   Patient is right handed.   Patient has 13 yrs of education.   Patient drinks 3 cups daily.   Social Determinants of Health   Financial Resource Strain: Low Risk   . Difficulty of Paying Living Expenses: Not hard at all  Food Insecurity: No Food Insecurity  . Worried About Charity fundraiser in the Last Year: Never true  . Ran Out of Food in the Last Year: Never true  Transportation Needs: No Transportation Needs  . Lack of Transportation (Medical): No  . Lack of Transportation (Non-Medical): No  Physical Activity: Sufficiently Active  . Days of Exercise per Week: 6 days  . Minutes of Exercise per Session: 40 min  Stress: No Stress Concern Present  . Feeling of Stress : Not at all  Social Connections: Moderately Isolated  . Frequency of Communication with Friends and Family: More than three times a week  . Frequency of Social Gatherings with Friends and Family: Once a week  . Attends Religious Services: Never  . Active Member of Clubs or Organizations: No  . Attends Archivist Meetings: Never  . Marital Status: Married     Tobacco Counseling Counseling given: Not Answered Comment: 30 yrs ago   Clinical Intake:  Pre-visit preparation completed: Yes  Pain : No/denies pain     Nutritional Status: BMI 25 -29 Overweight Nutritional Risks: None Diabetes: Yes CBG done?: No Did pt. bring in CBG monitor from home?: No  How often do you need to have someone help you when you read instructions, pamphlets, or other written materials from your doctor or pharmacy?: 1 - Never What is the last grade level you completed in school?: 1 year of college  Diabetic?Yes  Diabetes:  Is the patient diabetic?  Yes  If diabetic, was a CBG obtained today?  No  Did the patient bring in their glucometer from home?  No Phone visit How often do you monitor your CBG's? never.   Financial Strains and Diabetes Management:  Are  you having any financial strains with the device, your supplies or your medication? No .  Does the patient want to be seen by Chronic Care Management for management of their diabetes?  No  Would the patient like to be referred to a Nutritionist or for Diabetic Management?  No   Diabetic Exams:  Diabetic Eye Exam: Completed 06/2019. Requested patient have results sent to PCP.  Diabetic Foot Exam:Pt has been advised about the importance in completing this exam. To be completed by PCP   Interpreter Needed?: No  Information entered by :: Caroleen Hamman LPN   Activities of Daily Living In your present state of health, do you have any difficulty performing the following activities: 12/09/2019  Hearing? Y  Comment hearing aids  Vision? N  Difficulty concentrating or making decisions? N  Walking or climbing stairs? N  Dressing or bathing? N  Doing errands, shopping? N  Preparing Food and eating ? N  Using the Toilet? N  In the past six months, have you accidently leaked urine? N  Do you have problems with loss of bowel control? N  Managing your Medications? N  Managing your Finances? N   Housekeeping or managing your Housekeeping? N  Some recent data might be hidden    Patient Care Team: Nche, Charlene Brooke, NP as PCP - General (Internal Medicine) Garvin Fila, MD (Neurology) [place], Heather, Goodwin (Optometry) Irene Shipper, MD (Gastroenterology)  Indicate any recent Medical Services you may have received from other than [facility] providers in the past year (date may be approximate).     Assessment:   This is a routine wellness examination for Sani.  Hearing/Vision screen  Hearing Screening   125Hz  250Hz  500Hz  1000Hz  2000Hz  3000Hz  4000Hz  6000Hz  8000Hz   Right ear:           Left ear:           Comments: Bilateral hearing aids  Vision Screening Comments: Cataracts removed Last eye exam-06/2019 Dr. [place]  Dietary issues and exercise activities discussed: Current Exercise Habits: Home exercise routine, Type of exercise: walking;strength training/weights;stretching, Time (Minutes): 45, Frequency (Times/Week): 6, Weekly Exercise (Minutes/Week): 270, Intensity: Mild  Goals    . Patient Stated     Continue healthy eating & exercising      Depression Screen PHQ 2/9 Scores 12/09/2019 04/27/2019 07/02/2017 12/20/2014 12/20/2014 11/09/2013 03/11/2013  PHQ - 2 Score 0 0 0 0 0 0 0    Fall Risk Fall Risk  12/09/2019 04/27/2019 07/02/2017 12/10/2016 12/20/2014  Falls in the past year? 0 0 No No No  Number falls in past yr: 0 - - - -  Injury with Fall? 0 - - - -  Follow up Falls prevention discussed - - - -    Any stairs in or around the home? Yes  If so, are there any without handrails? No  Home free of loose throw rugs in walkways, pet beds, electrical cords, etc? Yes  Adequate lighting in your home to reduce risk of falls? Yes   ASSISTIVE DEVICES UTILIZED TO PREVENT FALLS:  Life alert? No  Use of a cane, walker or w/c? No  Grab bars in the bathroom? Yes  Shower chair or bench in shower? No  Elevated toilet seat or a handicapped toilet? No   TIMED UP AND GO:  Was the  test performed? No . Phone visit   Cognitive Function: No cognitive impairment noted.        Immunizations Immunization History  Administered Date(s) Administered  .  Fluad Quad(high Dose 65+) 12/23/2018  . Influenza Split 12/24/2011, 12/24/2014  . Influenza, High Dose Seasonal PF 12/23/2012, 12/25/2013, 12/20/2014, 12/28/2016  . Moderna SARS-COVID-2 Vaccination 04/27/2019, 05/18/2019  . Pneumococcal Conjugate-13 05/23/2008  . Pneumococcal Polysaccharide-23 10/23/2012  . Pneumococcal-Unspecified 02/24/2004, 11/01/2013  . Tdap 09/16/2011  . Zoster 10/23/2012, 03/26/2015    TDAP status: Up to date   Flu Vaccine Status:Due- Phone visit.Patient plans to get vaccine in the office.   Pneumococcal vaccine status: Up to date   Covid-19 vaccine status: Completed vaccines  Qualifies for Shingles Vaccine? Yes   Zostavax completed Yes   Shingrix Completed?: No.    Education has been provided regarding the importance of this vaccine. Patient has been advised to call insurance company to determine out of pocket expense if they have not yet received this vaccine. Advised may also receive vaccine at local pharmacy or Health Dept. Verbalized acceptance and understanding.  Screening Tests Health Maintenance  Topic Date Due  . FOOT EXAM  12/20/2015  . OPHTHALMOLOGY EXAM  11/27/2017  . HEMOGLOBIN A1C  10/17/2019  . INFLUENZA VACCINE  10/24/2019  . TETANUS/TDAP  09/15/2021  . COVID-19 Vaccine  Completed  . Hepatitis C Screening  Completed  . PNA vac Low Risk Adult  Completed    Health Maintenance  Health Maintenance Due  Topic Date Due  . FOOT EXAM  12/20/2015  . OPHTHALMOLOGY EXAM  11/27/2017  . HEMOGLOBIN A1C  10/17/2019  . INFLUENZA VACCINE  10/24/2019    Colorectal cancer screening: No longer required.   Lung Cancer Screening: (Low Dose CT Chest recommended if Age 15-80 years, 30 pack-year currently smoking OR have quit w/in 15years.) does not qualify.     Additional  Screening:  Hepatitis C Screening: Completed 11/10/2019  Vision Screening: Recommended annual ophthalmology exams for early detection of glaucoma and other disorders of the eye. Is the patient up to date with their annual eye exam?  Yes  Who is the provider or what is the name of the office in which the patient attends annual eye exams? Dr. [place]   Dental Screening: Recommended annual dental exams for proper oral hygiene  Community Resource Referral / Chronic Care Management: CRR required this visit?  No   CCM required this visit?  No      Plan:     I have personally reviewed and noted the following in the patient's chart:   . Medical and social history . Use of alcohol, tobacco or illicit drugs  . Current medications and supplements . Functional ability and status . Nutritional status . Physical activity . Advanced directives . List of other physicians . Hospitalizations, surgeries, and ER visits in previous 12 months . Vitals . Screenings to include cognitive, depression, and falls . Referrals and appointments  In addition, I have reviewed and discussed with patient certain preventive protocols, quality metrics, and best practice recommendations. A written personalized care plan for preventive services as well as general preventive health recommendations were provided to patient.  Due to this being a telephonic visit, the after visit summary with patients personalized plan was offered to patient via mail or my-chart. Patient would like to access on my-chart.     Marta Antu, LPN   [id number]  Nurse Health Advisor  Nurse Notes: None

## 2019-12-10 LAB — CBC AND DIFFERENTIAL
HCT: 36 — AB (ref 41–53)
Hemoglobin: 12.3 — AB (ref 13.5–17.5)
Platelets: 223 (ref 150–399)
WBC: 7.2

## 2019-12-10 LAB — HEPATIC FUNCTION PANEL
ALT: 44 — AB (ref 10–40)
AST: 19 (ref 14–40)
Alkaline Phosphatase: 77 (ref 25–125)
Bilirubin, Direct: 0.1 (ref 0.01–0.4)
Bilirubin, Total: 0.4

## 2019-12-10 LAB — COMPREHENSIVE METABOLIC PANEL: Albumin: 3.7 (ref 3.5–5.0)

## 2019-12-10 LAB — BASIC METABOLIC PANEL: Creatinine: 0.8 (ref 0.6–1.3)

## 2019-12-10 LAB — CBC: RBC: 4.23 (ref 3.87–5.11)

## 2019-12-13 ENCOUNTER — Encounter: Payer: Self-pay | Admitting: Nurse Practitioner

## 2019-12-13 DIAGNOSIS — E1159 Type 2 diabetes mellitus with other circulatory complications: Secondary | ICD-10-CM

## 2019-12-13 DIAGNOSIS — E785 Hyperlipidemia, unspecified: Secondary | ICD-10-CM

## 2019-12-16 DIAGNOSIS — H16223 Keratoconjunctivitis sicca, not specified as Sjogren's, bilateral: Secondary | ICD-10-CM | POA: Diagnosis not present

## 2019-12-20 ENCOUNTER — Encounter: Payer: Self-pay | Admitting: Nurse Practitioner

## 2019-12-20 ENCOUNTER — Other Ambulatory Visit: Payer: Medicare Other

## 2019-12-20 NOTE — Progress Notes (Signed)
Abstracted result and sent to scan  

## 2019-12-30 DIAGNOSIS — H16223 Keratoconjunctivitis sicca, not specified as Sjogren's, bilateral: Secondary | ICD-10-CM | POA: Diagnosis not present

## 2020-01-17 ENCOUNTER — Other Ambulatory Visit: Payer: Self-pay

## 2020-01-18 ENCOUNTER — Other Ambulatory Visit (INDEPENDENT_AMBULATORY_CARE_PROVIDER_SITE_OTHER): Payer: Medicare Other

## 2020-01-18 DIAGNOSIS — E785 Hyperlipidemia, unspecified: Secondary | ICD-10-CM | POA: Diagnosis not present

## 2020-01-18 DIAGNOSIS — E1159 Type 2 diabetes mellitus with other circulatory complications: Secondary | ICD-10-CM | POA: Diagnosis not present

## 2020-01-18 LAB — LIPID PANEL
Cholesterol: 192 mg/dL (ref 0–200)
HDL: 49.8 mg/dL (ref >39.00)
LDL Cholesterol: 108 mg/dL — ABNORMAL HIGH (ref 0–99)
NonHDL: 141.74
Total CHOL/HDL Ratio: 4
Triglycerides: 167 mg/dL — ABNORMAL HIGH (ref 0.0–149.0)
VLDL: 33.4 mg/dL (ref 0.0–40.0)

## 2020-01-18 LAB — COMPREHENSIVE METABOLIC PANEL
ALT: 38 U/L (ref 0–53)
AST: 25 U/L (ref 0–37)
Albumin: 4.1 g/dL (ref 3.5–5.2)
Alkaline Phosphatase: 70 U/L (ref 39–117)
BUN: 15 mg/dL (ref 6–23)
CO2: 25 mEq/L (ref 19–32)
Calcium: 9 mg/dL (ref 8.4–10.5)
Chloride: 99 mEq/L (ref 96–112)
Creatinine, Ser: 0.83 mg/dL (ref 0.40–1.50)
GFR: 84.73 mL/min (ref >60.00)
Glucose, Bld: 127 mg/dL — ABNORMAL HIGH (ref 70–99)
Potassium: 4.7 mEq/L (ref 3.5–5.1)
Sodium: 131 mEq/L — ABNORMAL LOW (ref 135–145)
Total Bilirubin: 0.5 mg/dL (ref 0.2–1.2)
Total Protein: 6.3 g/dL (ref 6.0–8.3)

## 2020-01-18 LAB — HEMOGLOBIN A1C: Hgb A1c MFr Bld: 6 % (ref 4.6–6.5)

## 2020-01-20 ENCOUNTER — Encounter: Payer: Self-pay | Admitting: Nurse Practitioner

## 2020-03-25 HISTORY — PX: CATARACT EXTRACTION W/ INTRAOCULAR LENS IMPLANT: SHX1309

## 2020-05-20 ENCOUNTER — Other Ambulatory Visit: Payer: Self-pay | Admitting: Nurse Practitioner

## 2020-05-20 DIAGNOSIS — E785 Hyperlipidemia, unspecified: Secondary | ICD-10-CM

## 2020-05-22 ENCOUNTER — Encounter: Payer: Self-pay | Admitting: Nurse Practitioner

## 2020-05-22 DIAGNOSIS — E785 Hyperlipidemia, unspecified: Secondary | ICD-10-CM

## 2020-05-23 MED ORDER — ATORVASTATIN CALCIUM 40 MG PO TABS: 40.0000 mg | ORAL_TABLET | ORAL | 3 refills | Status: DC

## 2020-06-29 ENCOUNTER — Telehealth: Payer: Self-pay | Admitting: Neurology

## 2020-06-29 NOTE — Telephone Encounter (Signed)
Pt would like a call to discuss tightening of his thigh muscles(has not fallen yet but feels his legs aren't strong enough to support him ) and difficulty writing.  Pt was offered Dr Clydene Fake next available(07-26) but declined but would very much like a call re: what Dr Leonie Man has to say about this.

## 2020-06-29 NOTE — Telephone Encounter (Signed)
Called patient back. Advised he has not been seen since 2020. He would need appt with Dr. Leonie Man first to be evaluated/further discuss. Pt wanted to know if he should see PCP first or Dr. Leonie Man. I recommended he see PCP first, we made appt for him to see Dr. Leonie Man on 10/17/20 at 3pm. He will call back to cx this if he does not need to follow up here after seeing PCP. I placed him on cx list as well. Nothing further needed.

## 2020-06-30 ENCOUNTER — Encounter: Payer: Self-pay | Admitting: Nurse Practitioner

## 2020-07-03 ENCOUNTER — Encounter: Payer: Self-pay | Admitting: Family Medicine

## 2020-07-03 ENCOUNTER — Ambulatory Visit: Payer: Medicare Other | Admitting: Nurse Practitioner

## 2020-07-03 ENCOUNTER — Other Ambulatory Visit: Payer: Self-pay

## 2020-07-03 ENCOUNTER — Ambulatory Visit: Payer: Medicare Other | Admitting: Family Medicine

## 2020-07-03 VITALS — BP 146/70 | HR 66 | Temp 97.5°F | Ht 67.0 in | Wt 186.0 lb

## 2020-07-03 DIAGNOSIS — N183 Chronic kidney disease, stage 3 unspecified: Secondary | ICD-10-CM | POA: Insufficient documentation

## 2020-07-03 DIAGNOSIS — E669 Obesity, unspecified: Secondary | ICD-10-CM | POA: Insufficient documentation

## 2020-07-03 DIAGNOSIS — M6281 Muscle weakness (generalized): Secondary | ICD-10-CM

## 2020-07-03 DIAGNOSIS — E1159 Type 2 diabetes mellitus with other circulatory complications: Secondary | ICD-10-CM

## 2020-07-03 DIAGNOSIS — G40909 Epilepsy, unspecified, not intractable, without status epilepticus: Secondary | ICD-10-CM

## 2020-07-03 DIAGNOSIS — N529 Male erectile dysfunction, unspecified: Secondary | ICD-10-CM | POA: Insufficient documentation

## 2020-07-03 DIAGNOSIS — F341 Dysthymic disorder: Secondary | ICD-10-CM | POA: Insufficient documentation

## 2020-07-03 LAB — COMPREHENSIVE METABOLIC PANEL
ALT: 33 U/L (ref 0–53)
AST: 22 U/L (ref 0–37)
Albumin: 4.1 g/dL (ref 3.5–5.2)
Alkaline Phosphatase: 63 U/L (ref 39–117)
BUN: 14 mg/dL (ref 6–23)
CO2: 25 mEq/L (ref 19–32)
Calcium: 9.6 mg/dL (ref 8.4–10.5)
Chloride: 100 mEq/L (ref 96–112)
Creatinine, Ser: 0.82 mg/dL (ref 0.40–1.50)
GFR: 84.77 mL/min (ref >60.00)
Glucose, Bld: 116 mg/dL — ABNORMAL HIGH (ref 70–99)
Potassium: 5 mEq/L (ref 3.5–5.1)
Sodium: 134 mEq/L — ABNORMAL LOW (ref 135–145)
Total Bilirubin: 0.4 mg/dL (ref 0.2–1.2)
Total Protein: 6.6 g/dL (ref 6.0–8.3)

## 2020-07-03 LAB — LIPID PANEL
Cholesterol: 218 mg/dL — ABNORMAL HIGH (ref 0–200)
HDL: 53 mg/dL (ref >39.00)
LDL Cholesterol: 129 mg/dL — ABNORMAL HIGH (ref 0–99)
NonHDL: 165.05
Total CHOL/HDL Ratio: 4
Triglycerides: 179 mg/dL — ABNORMAL HIGH (ref 0.0–149.0)
VLDL: 35.8 mg/dL (ref 0.0–40.0)

## 2020-07-03 LAB — PHOSPHORUS: Phosphorus: 3.8 mg/dL (ref 2.3–4.6)

## 2020-07-03 LAB — MAGNESIUM: Magnesium: 1.5 mg/dL (ref 1.5–2.5)

## 2020-07-03 LAB — MICROALBUMIN / CREATININE URINE RATIO
Creatinine,U: 124.9 mg/dL
Microalb Creat Ratio: 1.1 mg/g (ref 0.0–30.0)
Microalb, Ur: 1.4 mg/dL (ref 0.0–1.9)

## 2020-07-03 LAB — SEDIMENTATION RATE: Sed Rate: 3 mm/hr (ref 0–20)

## 2020-07-03 LAB — TSH: TSH: 3.03 u[IU]/mL (ref 0.35–4.50)

## 2020-07-03 LAB — HEMOGLOBIN A1C: Hgb A1c MFr Bld: 6.1 % (ref 4.6–6.5)

## 2020-07-03 NOTE — Progress Notes (Signed)
[facility] [address] [place] [address] Dept: [phone] Dept Fax: [phone]  Office Visit  Subjective:    Patient ID: Andrew Jones, male    DOB: 09/07/1940, 80 y.o..   MRN: [id number]  Chief Complaint  Patient presents with  . Acute Visit    C/o having weakness in both legs 5 days.  Also having stiffness, achy.      History of Present Illness:  Patient is in today for evaluation of an acute onset of bilateral upper leg weakness starting last Thursday. He notes that when he awakened, he thought he might not be able to get out of the bed. He found he felt quite stiff and noted that it felt like his balance might be off. He denies any pain in his muscles. He admits to some milder weakness feeling in his upper arms/shoudler girdle. Andrew Jones has a history of ankylosing spondylitis and is currently treated with Enbrel for this. He has had a past CVA, but denies any symptoms worse on one side than the other. He does take Dilantin for seizure prophylaxis and notes he has not had a level checked in quite some time. He notes that he is on a statin for cholesterol and had wondered if it was somehow related to his symptoms. His weakness today is improved compared to last Thursday, but is still bothersome.  Andrew Jones has a history of Type 2 DM. He feels that this has been doing well overall. He is currenlty managed on metformin.  Past Medical History: Patient Active Problem List   Diagnosis Date Noted  . Obesity 07/03/2020  . 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 Chronic kidney disease, stage III (moderate) ([place]) 07/03/2020  . Low magnesium level 04/29/2019  . Seborrheic keratosis 05/15/2018  . Hearing loss 12/01/2017  . BPH (benign prostatic hypertrophy) 12/20/2014  . 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[place]) 12/20/2014  . PSA elevation 12/20/2014  . Encounter for therapeutic drug  monitoring 12/08/2012  . Diabetes mellitus, type 2 ([place])   . Seizure disorder ([place])   . Hypertension   . Hyperlipidemia with target LDL less than 70   . CVA (cerebral vascular accident) ([place])   . Cluster headaches   . Osteoarthritis    Past Surgical History:  Procedure Laterality Date  . NO PAST SURGERIES     Family History  Problem Relation Age of Onset  . Heart failure Mother   . Heart disease Mother   . COPD Father   . Prostate cancer Brother    Outpatient Medications Prior to Visit  Medication Sig Dispense Refill  . amLODipine (NORVASC) 10 MG tablet Take 1 tablet (10 mg total) by mouth daily.    Marland Kitchen aspirin 81 MG tablet Take 81 mg by mouth daily.    Marland Kitchen atorvastatin (LIPITOR) 40 MG tablet Take 1 tablet (40 mg total) by mouth 3 (three) times a week. 45 tablet 3  . benazepril (LOTENSIN) 10 MG tablet Take 10 mg by mouth daily.    . cholecalciferol (VITAMIN D) 1000 UNITS tablet Take 1,000 Units by mouth daily.    . diclofenac (VOLTAREN) 75 MG EC tablet Take 1 tablet (75 mg total) by mouth daily. 30 tablet 5  . etanercept (ENBREL) 50 MG/ML injection Inject 50 mg into the skin once a week.    . fish oil-omega-3 fatty acids 1000 MG capsule Take 2 g by mouth daily.    Marland Kitchen  Magnesium Oxide 420 MG TABS Take 1 tablet (420 mg total) by mouth daily after breakfast.  0  . metFORMIN (GLUCOPHAGE) 1000 MG tablet Take 1,000 mg by mouth 2 (two) times daily with a meal.    . metoprolol tartrate (LOPRESSOR) 25 MG tablet Take 25 mg by mouth 2 (two) times daily.    . Multiple Vitamin (MULTIVITAMIN) tablet Take 1 tablet by mouth daily.    Marland Kitchen omeprazole (PRILOSEC) 20 MG capsule Take 20 mg by mouth daily.    . phenytoin (DILANTIN) 100 MG ER capsule Take 100 mg by mouth. 1 capsule in morning and 2 at night    . pioglitazone (ACTOS) 15 MG tablet Take 1 tablet (15 mg total) by mouth daily.    Marland Kitchen sulfaSALAzine (AZULFIDINE) 500 MG tablet Take 1 tablet (500 mg total) by mouth 3 (three) times daily. (Patient not  taking: Reported on 07/03/2020)     No facility-administered medications prior to visit.   No Known Allergies    Objective:   Today's Vitals   07/03/20 0812  BP: (!) 146/70  Pulse: 66  Temp: (!) 97.5 F (36.4 C)  TempSrc: Temporal  SpO2: 99%  Weight: 186 lb (84.4 kg)  Height: 5\' 7"  (1.702 m)   Body mass index is 29.13 kg/m.   General: Well developed, well nourished. No acute distress. Lungs: Clear to auscultation bilaterally. No wheezing, rales or rhonchi. CV: RRR without murmurs or rubs. Pulses 2+ bilaterally. Extremities: Full ROM. No joint swelling or tenderness. No edema noted. Neuro:CN II-XII grossly intact. Normal sensation. Strength 5/5 in all extremities. Patient able to get to standing without  assisting with UE. Patellar reflex is brisk (3+) bilaterally. His gait is a bit broad. He has no pronator drift. Psych: Alert and oriented. Normal mood and affect.  Health Maintenance Due  Topic Date Due  . FOOT EXAM  12/20/2015  . OPHTHALMOLOGY EXAM  11/27/2017      Assessment & Plan:   1. Proximal muscle weakness I will conduct lab evaluation for various causes of acute muscle weakness. I am particularly concerned for possible rheumatic cause, in light of his ankylosing spondylitis. I will follow-up based on results to determine next steps.  - Comprehensive metabolic panel - TSH - Sedimentation rate - Magnesium - Phosphorus - Aldolase - Lactate dehydrogenase - Ambulatory referral to Neurology  2. Type 2 diabetes mellitus with other circulatory complication, without long-term current use of insulin [facility]) Andrew Jones is due for a check on his diabetes.   - Lipid panel - Microalbumin / creatinine urine ratio - Hemoglobin A1c - Urinalysis, Routine w reflex microscopic  3. Seizure disorder ([place]) Because of the gait changes, I think it important that we rule-out Dilantin toxicity.  - Dilantin (Phenytoin) level, total  Andrew Salter, MD

## 2020-07-06 ENCOUNTER — Encounter: Payer: Self-pay | Admitting: Family Medicine

## 2020-07-06 LAB — EXTRA SPECIMEN

## 2020-07-06 LAB — ALDOLASE

## 2020-07-18 ENCOUNTER — Other Ambulatory Visit: Payer: Self-pay | Admitting: Neurology

## 2020-07-18 DIAGNOSIS — I6300 Cerebral infarction due to thrombosis of unspecified precerebral artery: Secondary | ICD-10-CM

## 2020-07-18 MED ORDER — ASPIRIN EC 81 MG PO TBEC: 81.0000 mg | DELAYED_RELEASE_TABLET | Freq: Every day | ORAL | 11 refills | Status: DC

## 2020-07-19 ENCOUNTER — Telehealth: Payer: Self-pay | Admitting: Neurology

## 2020-07-19 NOTE — Telephone Encounter (Signed)
BCBS medicare order sent to [facility]. No auth they will reach out to the patient to schedule.  

## 2020-07-20 ENCOUNTER — Other Ambulatory Visit: Payer: Self-pay | Admitting: Neurology

## 2020-07-20 DIAGNOSIS — I6300 Cerebral infarction due to thrombosis of unspecified precerebral artery: Secondary | ICD-10-CM

## 2020-07-24 ENCOUNTER — Other Ambulatory Visit: Payer: Self-pay | Admitting: Neurology

## 2020-07-24 MED ORDER — ALPRAZOLAM 0.25 MG PO TABS: 0.2500 mg | ORAL_TABLET | ORAL | 0 refills | Status: DC | PRN

## 2020-08-04 ENCOUNTER — Other Ambulatory Visit: Payer: Self-pay

## 2020-08-04 ENCOUNTER — Ambulatory Visit
Admission: RE | Admit: 2020-08-04 | Discharge: 2020-08-04 | Disposition: A | Discharge status: Home / Self Care | Payer: Medicare Other | Source: Ambulatory Visit | Attending: Neurology | Admitting: Neurology

## 2020-08-04 DIAGNOSIS — I6300 Cerebral infarction due to thrombosis of unspecified precerebral artery: Secondary | ICD-10-CM

## 2020-08-04 MED ORDER — GADOBENATE DIMEGLUMINE 529 MG/ML IV SOLN
15.0000 mL | Freq: Once | INTRAVENOUS | Status: AC | PRN
  Administered 2020-08-04: 15 mL via INTRAVENOUS

## 2020-08-07 ENCOUNTER — Telehealth: Payer: Self-pay | Admitting: Emergency Medicine

## 2020-08-07 NOTE — Telephone Encounter (Signed)
-----   Message from Garvin Fila, MD sent at 08/07/2020  8:25 AM EDT ----- Andrew Jones inform the patient that MRI scan of the brain shows no evidence of new stroke.  There is a small old stroke seen in the deep portion of the brain on the right side which is unchanged from the previous MRI from 2011.  There are mild changes of age-related hardening of the arteries and shrinkage of the brain.  No new or worrisome finding

## 2020-08-07 NOTE — Telephone Encounter (Signed)
I have not seen this patient for nearly 2 years and had seen him for seizures and stroke in the past.  He recently had new complaints and the primary care physician  wants him seen sooner for evaluation for bilateral proximal muscle weakness.  Since I do not have any opening this can be seen as a new consult by any of the MDs

## 2020-08-07 NOTE — Progress Notes (Signed)
Kindly inform the patient that MRI scan of the brain shows no evidence of new stroke.  There is a small old stroke seen in the deep portion of the brain on the right side which is unchanged from the previous MRI from 2011.  There are mild changes of age-related hardening of the arteries and shrinkage of the brain.  No new or worrisome finding

## 2020-09-05 ENCOUNTER — Encounter: Payer: Self-pay | Admitting: Nurse Practitioner

## 2020-09-19 DIAGNOSIS — E119 Type 2 diabetes mellitus without complications: Secondary | ICD-10-CM | POA: Diagnosis not present

## 2020-09-20 ENCOUNTER — Encounter: Payer: Self-pay | Admitting: Neurology

## 2020-09-20 ENCOUNTER — Ambulatory Visit: Payer: Medicare Other | Admitting: Neurology

## 2020-09-20 VITALS — BP 158/77 | HR 82 | Ht 68.0 in | Wt 186.2 lb

## 2020-09-20 DIAGNOSIS — E0841 Diabetes mellitus due to underlying condition with diabetic mononeuropathy: Secondary | ICD-10-CM

## 2020-09-20 DIAGNOSIS — R29898 Other symptoms and signs involving the musculoskeletal system: Secondary | ICD-10-CM | POA: Diagnosis not present

## 2020-09-20 DIAGNOSIS — Z87898 Personal history of other specified conditions: Secondary | ICD-10-CM

## 2020-09-20 NOTE — Patient Instructions (Signed)
I had a long discussion with the patient and his daughter-in-law regarding his bilateral proximal leg weakness and discussed differential diagnosis and evaluation plan and answered questions.  I recommend we check muscle enzymes, ESR, CRP, vitamin D levels and phenytoin levels.  Check EMG nerve conduction study.  I recommend he discontinue Lipitor as this could be statin myopathy.  He will continue Dilantin and the current dosage of 300 mg daily as he has been seizure-free for 10 years.  He will return for follow-up in the future in 3 months or call earlier if necessary.

## 2020-09-20 NOTE — Progress Notes (Signed)
[facility] [address] [place]. [place] [address] [phone]       OFFICE CONSULT NOTE  [name] Date of Birth:  1940/03/29 Medical Record Number:  [id number]   Referring MD: Arlester Marker  Reason for Referral: Proximal weakness  HPI: Andrew Jones is a pleasant 80 year old Caucasian male seen today for office consultation visit for proximal leg weakness.  He is accompanied by his daughter-in-law Andrew Jones.  History is obtained from them and review of electronic medical records and I personally reviewed available imaging films which are pertinent in PACS.  He has past medical history of diabetes, hypertension, seizures, sleep apnea, arthritis stroke.  He states he has noticed sudden onset of bilateral lower extremity proximal weakness for the last couple of months.  This happened shortly after he had had COVID infection in February.  He has noticed difficulty getting out of a chair requiring push himself up.  This is in both legs.  Denies any muscle pains, cramps and has not noticed any fasciculation at 1 2 movements.  Feels his balance is also slightly poor.  He has some good days and bad days.  He has been using a cane and can walk quite well and has had no falls or injuries.  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  He denies any weakness in his hands and has not dropping objects or noticed any decrease in his grip strength.  His diabetes has been under good control and last hemoglobin A1c was 6.0.  Patient was on a different statin in about 9 months ago was switched to Lipitor 40 mg which he is taking 3 times a week.  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 Comprehensive metabolic panel on 11/10/4035 was fairly unremarkable except for low sodium of 134.  TSH is normal.  LDL  cholesterol was 129 mg percent.  Hemoglobin A1c was 6.1.  He denies any back pain, radicular pain, falls or injury. He has past neurological history of seizures since March 2011 and has followed up in with me in the office with the last visit being on 12/14/2018.  He is on Dilantin 200 mg daily and has not had a breakthrough seizure for more than 10 years. ROS:   14 system review of systems is positive for thigh weakness, tingling, numbness, imbalance, gait difficulty, history of seizures all other systems negative  PMH:  Past Medical History:  Diagnosis Date   Ankylosing spondylitis ([place])    back, hips - on embrel 2000-2008, resumed 12/2012   CVA (cerebral vascular accident) ([place]) 1999, 2002   no residual problems   Diabetes mellitus, type 2 ([facility])    Dyslipidemia    Hypertension    Osteoarthritis    Seizure disorder ([place]) 05/2009   Sleep apnea    uses c-pap    Social History:  Social History   Socioeconomic History   Marital status: Married    Spouse name: Not on file   Number of children: 1   Years of education: 13   Highest education level: Not on file  Occupational History   Occupation: Retired  Tobacco Use   Smoking status: Former    Pack years: 0.00   Smokeless tobacco: Never   Tobacco comments:    30 yrs ago  Electronics engineer  Use   Vaping Use: Never used  Substance and Sexual Activity   Alcohol use: Not Currently   Drug use: No   Sexual activity: Yes  Other Topics Concern   Not on file  Social History Narrative   Lives with wife   Right Handed   Drinks 2-3 cups caffeine daily   Social Determinants of Health   Financial Resource Strain: Low Risk    Difficulty of Paying Living Expenses: Not hard at all  Food Insecurity: No Food Insecurity   Worried About Charity fundraiser in the Last Year: Never true   Ran Out of Food in the Last Year: Never true  Transportation Needs: No Transportation Needs   Lack of Transportation (Medical): No   Lack of Transportation  (Non-Medical): No  Physical Activity: Sufficiently Active   Days of Exercise per Week: 6 days   Minutes of Exercise per Session: 40 min  Stress: No Stress Concern Present   Feeling of Stress : Not at all  Social Connections: Moderately Isolated   Frequency of Communication with Friends and Family: More than three times a week   Frequency of Social Gatherings with Friends and Family: Once a week   Attends Religious Services: Never   Marine scientist or Organizations: No   Attends Music therapist: Never   Marital Status: Married  Human resources officer Violence: Not At Risk   Fear of Current or Ex-Partner: No   Emotionally Abused: No   Physically Abused: No   Sexually Abused: No    Medications:   Current Outpatient Medications on File Prior to Visit  Medication Sig Dispense Refill   amLODipine (NORVASC) 10 MG tablet Take 1 tablet (10 mg total) by mouth daily.     aspirin EC 81 MG tablet Take 1 tablet (81 mg total) by mouth daily. Swallow whole. 30 tablet 11   atorvastatin (LIPITOR) 40 MG tablet Take 1 tablet (40 mg total) by mouth 3 (three) times a week. 45 tablet 3   benazepril (LOTENSIN) 10 MG tablet Take 10 mg by mouth daily.     cholecalciferol (VITAMIN D) 1000 UNITS tablet Take 1,000 Units by mouth daily.     diclofenac (VOLTAREN) 75 MG EC tablet Take 1 tablet (75 mg total) by mouth daily. 30 tablet 5   etanercept (ENBREL) 50 MG/ML injection Inject 50 mg into the skin once a week.     fish oil-omega-3 fatty acids 1000 MG capsule Take 2 g by mouth daily.     Magnesium Oxide 420 MG TABS Take 1 tablet (420 mg total) by mouth daily after breakfast.  0   metFORMIN (GLUCOPHAGE) 1000 MG tablet Take 1,000 mg by mouth 2 (two) times daily with a meal.     metoprolol tartrate (LOPRESSOR) 25 MG tablet Take 25 mg by mouth 2 (two) times daily.     Multiple Vitamin (MULTIVITAMIN) tablet Take 1 tablet by mouth daily.     omeprazole (PRILOSEC) 20 MG capsule Take 20 mg by mouth  daily.     phenytoin (DILANTIN) 100 MG ER capsule Take 100 mg by mouth. 1 capsule in morning and 2 at night     pioglitazone (ACTOS) 15 MG tablet Take 1 tablet (15 mg total) by mouth daily.     ALPRAZolam (XANAX) 0.25 MG tablet Take 1 tablet (0.25 mg total) by mouth as needed for anxiety. Take 1 tablet 30 mins prior to mri and repeat x 1. Do not drive after taking this medicine  2 tablet 0   sulfaSALAzine (AZULFIDINE) 500 MG tablet Take 1 tablet (500 mg total) by mouth 3 (three) times daily. (Patient not taking: Reported on 07/03/2020)     No current facility-administered medications on file prior to visit.    Allergies:  No Known Allergies  Physical Exam General: well developed, well nourished elderly Caucasian male, seated, in no evident distress Head: head normocephalic and atraumatic.   Neck: supple with no carotid or supraclavicular bruits Cardiovascular: regular rate and rhythm, no murmurs Musculoskeletal: no deformity Skin:  no rash/petichiae Vascular:  Normal pulses all extremities  Neurologic Exam Mental Status: Awake and fully alert. Oriented to place and time. Recent and remote memory intact. Attention span, concentration and fund of knowledge appropriate. Mood and affect appropriate.  Cranial Nerves: Fundoscopic exam reveals sharp disc margins. Pupils equal, briskly reactive to light. Extraocular movements full without nystagmus. Visual fields full to confrontation. Hearing intact. Facial sensation intact. Face, tongue, palate moves normally and symmetrically.  Motor: Normal bulk and tone. Normal strength in all tested extremity muscles.  Mild weakness of bilateral hip flexors 4/5.  No muscle wasting noted.  No fasciculations noted. Sensory.:  Mildly diminished touch , pinprick , position and vibratory sensation.  In both legs from ankle down. Coordination: Rapid alternating movements normal in all extremities. Finger-to-nose and heel-to-shin performed accurately bilaterally. Gait  and Station: Arises from chair with slight difficulty. Stance is normal. Gait demonstrates normal stride length and balance . Able to heel, toe and tandem walk without difficulty.  Uses a cane for ambulation. Reflexes: 1+ and symmetric. Toes downgoing.      ASSESSMENT: 80 year old Caucasian male with sudden onset of proximal lower extremity weakness and closely following COVID infection of unclear etiology.  Brain imaging has ruled out a stroke.  Possibilities include myopathy from statin usage versus diabetic amyotrophy.     PLAN:I had a long discussion with the patient and his daughter-in-law regarding his bilateral proximal leg weakness and discussed differential diagnosis and evaluation plan and answered questions.  I recommend we check muscle enzymes, ESR, CRP, vitamin D levels and phenytoin levels.  Check EMG nerve conduction study.  I recommend he discontinue Lipitor as this could be statin myopathy.  He will continue Dilantin and the current dosage of 300 mg daily as he has been seizure-free for 10 years.  He will return for follow-up in the future in 3 months or call earlier if necessary.  Greater than 50% time during this 45-minute consultation visit was spent on counseling and coordination of care about his proximal leg weakness and discussion of differential diagnosis and evaluation plan and answering questions. Antony Contras, MD  Note: This document was prepared with digital dictation and possible smart phrase technology. Any transcriptional errors that result from this process are unintentional.

## 2020-09-21 LAB — VITAMIN D 25 HYDROXY (VIT D DEFICIENCY, FRACTURES): Vit D, 25-Hydroxy: 28.2 ng/mL — ABNORMAL LOW (ref 30.0–100.0)

## 2020-09-21 LAB — SEDIMENTATION RATE: Sed Rate: 2 mm/hr (ref 0–30)

## 2020-09-21 LAB — C-REACTIVE PROTEIN: CRP: <1 mg/L (ref 0–10)

## 2020-09-21 LAB — CK: Total CK: 84 U/L (ref 41–331)

## 2020-09-21 LAB — ALDOLASE: Aldolase: 6.2 U/L (ref 3.3–10.3)

## 2020-09-21 LAB — PHENYTOIN LEVEL, TOTAL: Phenytoin (Dilantin), Serum: 6.3 ug/mL — ABNORMAL LOW (ref 10.0–20.0)

## 2020-09-21 LAB — LACTATE DEHYDROGENASE: LDH: 150 IU/L (ref 121–224)

## 2020-09-22 NOTE — Progress Notes (Signed)
Kindly inform the patient that lab work for muscle damage and inflammatory markers was normal.  Vitamin D levels were low and to see her his primary care physician to discuss vitamin D replacement which may be contributing to his weakness.

## 2020-09-26 ENCOUNTER — Telehealth: Payer: Self-pay | Admitting: *Deleted

## 2020-09-26 NOTE — Telephone Encounter (Signed)
-----   Message from Garvin Fila, MD sent at 09/22/2020  3:44 PM EDT ----- Andrew Jones inform the patient that lab work for muscle damage and inflammatory markers was normal.  Vitamin D levels were low and to see her his primary care physician to discuss vitamin D replacement which may be contributing to his weakness.

## 2020-09-26 NOTE — Telephone Encounter (Signed)
I spoke to the patient and provided him with lab results. He has a pending appt w/ his PCP.

## 2020-09-27 ENCOUNTER — Telehealth: Payer: Self-pay

## 2020-09-27 DIAGNOSIS — E785 Hyperlipidemia, unspecified: Secondary | ICD-10-CM

## 2020-09-27 NOTE — Telephone Encounter (Signed)
BCBS is requesting a new prescription be sent to [facility] to reflect the change in dose of Atorvastatin to 40 mg 3 times/weekly.  Thank you

## 2020-09-28 ENCOUNTER — Encounter: Payer: Self-pay | Admitting: Nurse Practitioner

## 2020-09-28 MED ORDER — ATORVASTATIN CALCIUM 40 MG PO TABS: 40.0000 mg | ORAL_TABLET | ORAL | 3 refills | Status: DC

## 2020-09-28 NOTE — Telephone Encounter (Signed)
Chart supports Rx Rx sent to pharmacy

## 2020-10-10 ENCOUNTER — Telehealth: Payer: Self-pay | Admitting: Neurology

## 2020-10-10 NOTE — Telephone Encounter (Signed)
7/21 ncv/emg cancelled due to nicole being out of office. LVM & sent mychart message requesting pt to cb and r/s.

## 2020-10-12 ENCOUNTER — Encounter: Payer: Medicare Other | Admitting: Neurology

## 2020-10-17 ENCOUNTER — Ambulatory Visit: Payer: Self-pay | Admitting: Neurology

## 2020-11-02 ENCOUNTER — Encounter: Payer: Medicare Other | Admitting: Diagnostic Neuroimaging

## 2020-11-02 ENCOUNTER — Ambulatory Visit (INDEPENDENT_AMBULATORY_CARE_PROVIDER_SITE_OTHER): Payer: Medicare Other | Admitting: Diagnostic Neuroimaging

## 2020-11-02 DIAGNOSIS — R29898 Other symptoms and signs involving the musculoskeletal system: Secondary | ICD-10-CM | POA: Diagnosis not present

## 2020-11-02 DIAGNOSIS — Z0289 Encounter for other administrative examinations: Secondary | ICD-10-CM

## 2020-11-02 NOTE — Procedures (Signed)
[facility]  NCS (NERVE CONDUCTION STUDY) WITH EMG (ELECTROMYOGRAPHY) REPORT   STUDY DATE: 11/02/20 PATIENT NAME: Andrew Jones AGE DOB: Dec 22, 1940 MRN: [id number]  ORDERING CLINICIAN: Antony Contras, MD   TECHNOLOGIST: Sherre Scarlet ELECTROMYOGRAPHER: Earlean Polka. Leveda Kendrix, MD  CLINICAL INFORMATION: 80 year old male with lower extremity weakness.  FINDINGS: NERVE CONDUCTION STUDY:  Bilateral peroneal and tibial motor responses have slow conduction velocities (ranging 32-38 m/s).  Bilateral tibial and left peroneal motor responses also have decreased amplitudes.  Right peroneal motor response has normal amplitude.  Bilateral sural and superficial peroneal sensory responses could not be obtained.  Bilateral tibial F wave latencies are prolonged.   NEEDLE ELECTROMYOGRAPHY:  Needle examination of right lower extremity and right lumbar paraspinal muscles normal.   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   INTERPRETING PHYSICIAN:  Penni Bombard, MD Certified in Neurology, Neurophysiology and Neuroimaging  [facility] [address], [place], [place] [address] [phone]    [facility]    Nerve / Sites Muscle Latency Ref. Amplitude Ref. Rel Amp Segments Distance Velocity Ref. Area    ms ms mV mV %  cm m/s m/s mVms  R Peroneal - EDB     Ankle EDB 4.7 ?6.5 2.3 ?2.0 100 Ankle - EDB 9   8.3     Fib head EDB 11.5  1.9  82.5 Fib head - Ankle 26 38 ?44 7.6     Pop fossa EDB 14.5  1.8  94.9 Pop fossa - Fib head 10 34 ?44 7.5         Pop fossa - Ankle      L Peroneal - EDB     Ankle EDB 5.1 ?6.5 1.5 ?2.0 100 Ankle - EDB 9   4.0     Fib head EDB 13.0  1.4  95.9 Fib head - Ankle 26 33 ?44 5.1     Pop fossa EDB 16.1  1.4  97.1 Pop fossa - Fib head 10 32 ?44 4.6         Pop fossa - Ankle      R Tibial - AH     Ankle AH 3.7 ?5.8 0.5 ?4.0 100 Ankle - AH 9   2.2     Pop fossa AH 14.8  0.2  29.8 Pop  fossa - Ankle 38 34 ?41 0.4  L Tibial - AH     Ankle AH 4.0 ?5.8 1.0 ?4.0 100 Ankle - AH 9   2.7     Pop fossa AH 14.8  0.5  52.9 Pop fossa - Ankle 38 35 ?41 1.2             SNC    Nerve / Sites Rec. Site Peak Lat Ref.  Amp Ref. Segments Distance    ms ms V V  cm  R Sural - Ankle (Calf)     Calf Ankle NR ?4.4 NR ?6 Calf - Ankle 14  L Sural - Ankle (Calf)     Calf Ankle NR ?4.4 NR ?6 Calf - Ankle 14  R Superficial peroneal - Ankle     Lat leg Ankle NR ?4.4 NR ?6 Lat leg - Ankle 14  L Superficial peroneal - Ankle     Lat leg Ankle NR ?4.4 NR ?6 Lat leg - Ankle 14             F  Wave    Nerve F Lat Ref.   ms  ms  R Tibial - AH 63.6 ?56.0  L Tibial - AH 62.1 ?56.0         EMG Summary Table    Spontaneous MUAP Recruitment  Muscle IA Fib PSW Fasc Other Amp Dur. Poly Pattern  R. Vastus medialis Normal None None None _______ Normal Normal Normal Normal  R. Tibialis anterior Normal None None None _______ Normal Normal Normal Normal  R. Gastrocnemius (Medial head) Normal None None None _______ Normal Normal Normal Normal  R. Iliopsoas Normal None None None _______ Normal Normal Normal Normal  R. Gluteus medius Normal None None None _______ Normal Normal Normal Normal  R. Lumbar paraspinals Normal None None None _______ Normal Normal Normal Normal

## 2020-11-13 ENCOUNTER — Encounter: Payer: Medicare Other | Admitting: Neurology

## 2020-11-14 ENCOUNTER — Other Ambulatory Visit: Payer: Self-pay | Admitting: Neurology

## 2020-11-14 DIAGNOSIS — R29898 Other symptoms and signs involving the musculoskeletal system: Secondary | ICD-10-CM

## 2020-11-15 ENCOUNTER — Telehealth: Payer: Self-pay | Admitting: Emergency Medicine

## 2020-11-15 NOTE — Telephone Encounter (Signed)
-----   Message from Garvin Fila, MD sent at 11/15/2020  8:25 AM EDT ----- Andrew Jones inform the patient that nerve conduction EMG study shows evidence of neuropathy there is nerve damage at the bottom of the feet which is likely from his diabetes.  No evidence of muscle damage.  Continue present management.  Call back if any questions.  We will discuss further at follow-up visit ----- Message ----- From: Penni Bombard, MD Sent: 11/02/2020   4:49 PM EDT To: Garvin Fila, MD

## 2020-11-22 ENCOUNTER — Other Ambulatory Visit: Payer: Self-pay

## 2020-11-22 ENCOUNTER — Ambulatory Visit: Payer: Medicare Other | Attending: Neurology

## 2020-11-22 DIAGNOSIS — M6281 Muscle weakness (generalized): Secondary | ICD-10-CM | POA: Diagnosis not present

## 2020-11-22 DIAGNOSIS — R2689 Other abnormalities of gait and mobility: Secondary | ICD-10-CM | POA: Diagnosis not present

## 2020-11-22 NOTE — Therapy (Signed)
[facility] [address] [place], [place], [address] Phone: [phone]   Fax:  [phone]  Physical Therapy Evaluation  Patient Details  Name: Andrew Jones MRN: [id number] Date of Birth: 1940/10/24 Referring Provider (PT): Antony Contras, MD   Encounter Date: 11/22/2020   PT End of Session - 11/22/20 1527     Visit Number 1    Date for PT Re-Evaluation 01/17/21    Authorization Type BCBS Medicare    Progress Note Due on Visit 10    PT Start Time 1447    PT Stop Time 1524    PT Time Calculation (min) 37 min    Activity Tolerance Patient tolerated treatment well    Behavior During Therapy [facility] for tasks assessed/performed             Past Medical History:  Diagnosis Date   Ankylosing spondylitis ([place])    back, hips - on embrel 2000-2008, resumed 12/2012   CVA (cerebral vascular accident) ([place]) 1999, 2002   no residual problems   Diabetes mellitus, type 2 ([place])    Dyslipidemia    Hypertension    Osteoarthritis    Seizure disorder ([place]) 05/2009   Sleep apnea    uses c-pap    Past Surgical History:  Procedure Laterality Date   NO PAST SURGERIES      There were no vitals filed for this visit.    Subjective Assessment - 11/22/20 1444     Subjective I woke up on 06/30/20 and collapsed to the ground because my legs are so week.  I was taking statin drugs and I have stopped taking those (60 days ago).  I have been feeling a little bit stronger but I have not been able to do my usual walking of 30 minutes with hiking sticks.    Pertinent History peripheral neuropathy, ankylosing spondylitis, DM, HTN    How long can you stand comfortably? 7-8 minutes    How long can you walk comfortably? 5 minutes    Diagnostic tests NCV: axonal sensorimotor polyneuropathy    Patient Stated Goals return to regular walking    Currently in Pain? No/denies                [facility] PT Assessment - 11/22/20 0001        Assessment   Medical Diagnosis weakness of both lower extremities    Referring Provider (PT) Antony Contras, MD    Onset Date/Surgical Date 06/30/20    Next MD Visit 01/22/21    Prior Therapy none      Precautions   Precautions Fall      Balance Screen   Has the patient fallen in the past 6 months No    Has the patient had a decrease in activity level because of a fear of falling?  No    Is the patient reluctant to leave their home because of a fear of falling?  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    Leisure walking for exercise      Cognition   Overall Cognitive Status Within Functional Limits for tasks assessed      Posture/Postural Control   Posture/Postural Control Postural limitations    Postural Limitations Flexed trunk;Forward head      ROM / Strength   AROM / PROM / Strength AROM;PROM;Strength      AROM   Overall AROM  Deficits    Overall AROM Comments hip flexibility limited by 25-50%      Strength   Overall Strength Deficits    Strength Assessment Site Ankle;Knee;Hip    Right/Left Hip Right;Left    Right Hip Flexion 4/5    Right Hip Extension 4/5    Right Hip ABduction 4-/5    Left Hip Flexion 4/5    Left Hip Extension 4/5    Left Hip ABduction 4-/5    Right/Left Knee Right;Left    Right Knee Flexion 4+/5    Right Knee Extension 4+/5    Left Knee Flexion 4+/5    Left Knee Extension 4+/5    Right/Left Ankle Right;Left    Right Ankle Dorsiflexion 4/5    Left Ankle Dorsiflexion 4/5      Transfers   Transfers Sit to Stand;Stand to Sit    Sit to Stand  6: Modified independent (Device/Increase time)    Five time sit to stand comments  13.37 seconds    Stand to Sit 6: Modified independent (Device/Increase time)    Comments instability upon standing      Ambulation/Gait   Ambulation/Gait Yes    Ambulation/Gait Assistance 6: Modified independent (Device/Increase time)    Gait Pattern Wide base of support;Trunk flexed;Ataxic;Step-through pattern    Stairs Yes    Stairs Assistance 6: Modified independent (Device/Increase time)    Stair Management Technique One rail Right;Alternating pattern    Gait Comments SLR Rt 6 seconds, Lt 5 seconds                        Objective measurements completed on examination: See above findings.               PT Education - 11/22/20 1517     Education Details Access Code: [email or web address]    Person(s) Educated Patient    Methods Explanation;Demonstration;Handout    Comprehension Verbalized understanding;Returned demonstration              PT Short Term Goals - 11/22/20 1435       PT SHORT TERM GOAL #1   Title be independent in initial HEP    Time 4    Period Weeks    Status New    Target Date 12/20/20      PT SHORT TERM GOAL #2   Title demonstrate normalized step length with gait on level surface > 50% of the time while in the clinic    Time 4    Period Weeks    Status New    Target Date 12/20/20      PT SHORT TERM GOAL #3   Title perform 5x sit to stand in < or = to 12 seconds to reduce falls risk    Time 4    Period Weeks    Status New    Target Date 12/20/20      PT SHORT TERM GOAL #4   Title improve LE strength and endurance to walk for 10 minutes for exercise without rest    Time 4  Period Weeks    Status New    Target Date 12/20/20               PT Long Term Goals - 11/22/20 1436       PT LONG TERM GOAL #1   Title be independent in advanced HEP    Time 8    Period Weeks    Status New    Target Date 01/17/21      PT LONG TERM GOAL #2    Title perform 5x sit to stand in < or = to 11 seconds to reduce falls risk    Time 8    Period Weeks    Status New    Target Date 01/17/21      PT LONG TERM GOAL #3   Title perform single leg stance on the Rt and Lt 8-10 seconds to reduce falls risk    Time 8    Period Weeks    Status New    Target Date 01/17/21      PT LONG TERM GOAL #4   Title improve LE strength to walk for > or = to 15-20 minutes without limitation while using walking sticks    Time 8    Period Weeks    Status New    Target Date 01/17/21      PT LONG TERM GOAL #5   Title demonstrate 4+/5 bil hip strength to improve stability with gait and mobility    Time 8    Period Weeks    Status New    Target Date 01/17/21                    Plan - 11/22/20 1531     Clinical Impression Statement Pt presents to PT with bil LE weakness with sudden onset on 06/30/20.   He states he has noticed sudden onset when he got out of bed  and had had bilateral lower extremity proximal weakness for the last couple of months.  This happened shortly after he had had COVID infection in February. Nerve conduction velocity test indicated axonal sensorimotor polyneuropathy and pt has history of peripheral neuropathy associated with his diabetes. Pt also has history of ankylosing spondylitis.    Pt wants to work on getting his legs stronger. He was walking for 30 minutes with walking poles and has not been able to do this since April.  Pt demonstrates ataxic gait pattern with flexed trunk and shortened step length.  Pt is unsteady upon standing from a chair.  5x sit to stand test is 13.37 indicating a falls risk.  Pt is now limited to walking 5 minutes and standing 7-8 minutes due to weakness.  Pt with reduced hip, knee and ankle strength bilaterally.  Pt will benefit from skilled PT to address weakness, gait and endurance to improve balance and safety at home and in the community.    Personal Factors and Comorbidities Age;Comorbidity  2    Comorbidities peripheral neuropathy, ankylosing spondylsosis, HTN, DM    Examination-Activity Limitations Locomotion Level;Stairs;Stand;Transfers    Examination-Participation Restrictions Community Activity;Shop;Meal Prep    Stability/Clinical Decision Making Evolving/Moderate complexity    Clinical Decision Making Moderate    Rehab Potential Good    PT Frequency 2x / week    PT Duration 8 weeks    PT Treatment/Interventions ADLs/Self Care Home Management;Gait training;Stair training;Functional mobility training;Therapeutic activities;Therapeutic exercise;Balance training;Neuromuscular re-education;Manual techniques;Patient/family education;Passive range of motion;Taping    PT Next Visit Plan gait training,  balance, hip and knee strength    PT Home Exercise Plan Access Code: [email or web address]    Consulted and Agree with Plan of Care Patient             Patient will benefit from skilled therapeutic intervention in order to improve the following deficits and impairments:  Abnormal gait, Decreased activity tolerance, Decreased balance, Decreased mobility, Postural dysfunction, Impaired flexibility, Decreased endurance, Decreased range of motion  Visit Diagnosis: Muscle weakness (generalized) - Plan: PT plan of care cert/re-cert  Other abnormalities of gait and mobility - Plan: PT plan of care cert/re-cert     Problem List Patient Active Problem List   Diagnosis Date Noted   Obesity 07/03/2020   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   Chronic kidney disease, stage III (moderate) (HCC) 07/03/2020   Low magnesium level 04/29/2019   Seborrheic keratosis 05/15/2018   Hearing loss 12/01/2017   BPH (benign prostatic hypertrophy) 12/20/2014   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[place]) 12/20/2014   PSA elevation 12/20/2014   Encounter for therapeutic drug monitoring 12/08/2012   Diabetes mellitus, type 2 (HCC)    Seizure disorder (HCC)    Hypertension     Hyperlipidemia with target LDL less than 70    CVA (cerebral vascular accident) ([place])    Cluster headaches    Osteoarthritis     Sigurd Sos, PT 11/22/20 5:04 PM   [facility] [address], [place], [place], [address] Phone: [phone]   Fax:  [phone]  Name: Andrew Jones MRN: [id number] Date of Birth: [birth year]

## 2020-11-22 NOTE — Patient Instructions (Signed)
Access Code: [email or web address] URL: [email or web address] Date: 11/22/2020 Prepared by: Claiborne Billings  Exercises Seated Figure 4 Piriformis Stretch - 3 x daily - 7 x weekly - 1 sets - 3 reps - 20-30 hold Seated Hamstring Stretch - 3 x daily - 7 x weekly - 1 sets - 3 reps - 20-30 hold Supine Bridge - 1 x daily - 7 x weekly - 1-2 sets - 10 reps - 5 hold Standing Hip Abduction with Unilateral Counter Support - 2 x daily - 7 x weekly - 2 sets - 10 reps Sit to Stand Without Arm Support - 2 x daily - 7 x weekly - 2 sets - 10 reps

## 2020-11-23 ENCOUNTER — Encounter: Payer: Self-pay | Admitting: Physical Therapy

## 2020-11-23 ENCOUNTER — Ambulatory Visit: Payer: Medicare Other | Attending: Neurology | Admitting: Physical Therapy

## 2020-11-23 DIAGNOSIS — M6281 Muscle weakness (generalized): Secondary | ICD-10-CM | POA: Insufficient documentation

## 2020-11-23 DIAGNOSIS — R2689 Other abnormalities of gait and mobility: Secondary | ICD-10-CM | POA: Insufficient documentation

## 2020-11-23 NOTE — Therapy (Signed)
[facility] [address], [place], [place], [address] Phone: [phone]   Fax:  [phone]  Physical Therapy Treatment  Patient Details  Name: Andrew Jones MRN: [id number] Date of Birth: 03-20-1941 Referring Provider (PT): Antony Contras, MD   Encounter Date: 11/23/2020   PT End of Session - 11/23/20 1150     Visit Number 2    Date for PT Re-Evaluation 01/17/21    Authorization Type BCBS Medicare    Progress Note Due on Visit 10    PT Start Time 1150    PT Stop Time 1231    PT Time Calculation (min) 41 min    Activity Tolerance Patient tolerated treatment well    Behavior During Therapy [facility] for tasks assessed/performed             Past Medical History:  Diagnosis Date   Ankylosing spondylitis ([place])    back, hips - on embrel 2000-2008, resumed 12/2012   CVA (cerebral vascular accident) ([place]) 1999, 2002   no residual problems   Diabetes mellitus, type 2 ([place])    Dyslipidemia    Hypertension    Osteoarthritis    Seizure disorder ([place]) 05/2009   Sleep apnea    uses c-pap    Past Surgical History:  Procedure Laterality Date   NO PAST SURGERIES      There were no vitals filed for this visit.   Subjective Assessment - 11/23/20 1150     Subjective No new complaints.    Pertinent History peripheral neuropathy, ankylosing spondylitis, DM, HTN    Patient Stated Goals return to regular walking    Currently in Pain? No/denies                               [facility] Adult PT Treatment/Exercise - 11/23/20 0001       Exercises   Exercises Knee/Hip      Knee/Hip Exercises: Stretches   Active Hamstring Stretch Both;3 reps;30 seconds    Piriformis Stretch Both;3 reps;30 seconds      Knee/Hip Exercises: Standing   Heel Raises --    Heel Raises Limitations --    Hip Abduction Both;2 sets;10 reps;Knee straight    Abduction Limitations unilateral UE support      Knee/Hip Exercises:  Seated   Sit to Sand 20 reps;without UE support   2nd set with 10#     Knee/Hip Exercises: Supine   Bridges with Clamshell 10 reps;Both   marching     Knee/Hip Exercises: Prone   Other Prone Exercises plank x 20 sec                 Balance Exercises - 11/23/20 0001       Balance Exercises: Standing   Tandem Stance Eyes open;Intermittent upper extremity support;5 reps   semi tandem stance; with head turns   Sidestepping --   GTB 4x10   Heel Raises Right;Left;10 reps   bil x 5, uilateral x 10   Other Standing Exercises Resisted bwd walking 25# x 5; fwd 15# x 3 (20# and 25# too difficult on return)               PT Education - 11/23/20 1241     Education Details HEP progressed    Person(s) Educated Patient    Methods Explanation;Demonstration;Handout    Comprehension Verbalized understanding;Returned demonstration  PT Short Term Goals - 11/22/20 1435       PT SHORT TERM GOAL #1   Title be independent in initial HEP    Time 4    Period Weeks    Status New    Target Date 12/20/20      PT SHORT TERM GOAL #2   Title demonstrate normalized step length with gait on level surface > 50% of the time while in the clinic    Time 4    Period Weeks    Status New    Target Date 12/20/20      PT SHORT TERM GOAL #3   Title perform 5x sit to stand in < or = to 12 seconds to reduce falls risk    Time 4    Period Weeks    Status New    Target Date 12/20/20      PT SHORT TERM GOAL #4   Title improve LE strength and endurance to walk for 10 minutes for exercise without rest    Time 4    Period Weeks    Status New    Target Date 12/20/20               PT Long Term Goals - 11/22/20 1436       PT LONG TERM GOAL #1   Title be independent in advanced HEP    Time 8    Period Weeks    Status New    Target Date 01/17/21      PT LONG TERM GOAL #2   Title perform 5x sit to stand in < or = to 11 seconds to reduce falls risk    Time 8     Period Weeks    Status New    Target Date 01/17/21      PT LONG TERM GOAL #3   Title perform single leg stance on the Rt and Lt 8-10 seconds to reduce falls risk    Time 8    Period Weeks    Status New    Target Date 01/17/21      PT LONG TERM GOAL #4   Title improve LE strength to walk for > or = to 15-20 minutes without limitation while using walking sticks    Time 8    Period Weeks    Status New    Target Date 01/17/21      PT LONG TERM GOAL #5   Title demonstrate 4+/5 bil hip strength to improve stability with gait and mobility    Time 8    Period Weeks    Status New    Target Date 01/17/21                   Plan - 11/23/20 1236     Clinical Impression Statement Patient did well with initial TE. HEP reviewed. Added wt to sit to stand. Some LOB with backward walking on return from resisted forward walking requiring CGA. Tandem balance too difficult. Challenged with half tandem and head turns which was given for HEP. Excellent job with plank.    Comorbidities peripheral neuropathy, ankylosing spondylsosis, HTN, DM    PT Treatment/Interventions ADLs/Self Care Home Management;Gait training;Stair training;Functional mobility training;Therapeutic activities;Therapeutic exercise;Balance training;Neuromuscular re-education;Manual techniques;Patient/family education;Passive range of motion;Taping    PT Next Visit Plan gait training, balance, hip and knee strength    PT Home Exercise Plan Access Code: [email or web address]  Patient will benefit from skilled therapeutic intervention in order to improve the following deficits and impairments:  Abnormal gait, Decreased activity tolerance, Decreased balance, Decreased mobility, Postural dysfunction, Impaired flexibility, Decreased endurance, Decreased range of motion  Visit Diagnosis: Muscle weakness (generalized)  Other abnormalities of gait and mobility     Problem List Patient Active Problem List   Diagnosis Date  Noted   Obesity 07/03/2020   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   Chronic kidney disease, stage III (moderate) (HCC) 07/03/2020   Low magnesium level 04/29/2019   Seborrheic keratosis 05/15/2018   Hearing loss 12/01/2017   BPH (benign prostatic hypertrophy) 12/20/2014   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[place]) 12/20/2014   PSA elevation 12/20/2014   Encounter for therapeutic drug monitoring 12/08/2012   Diabetes mellitus, type 2 ([place])    Seizure disorder (HCC)    Hypertension    Hyperlipidemia with target LDL less than 70    CVA (cerebral vascular accident) ([place])    Cluster headaches    Osteoarthritis    Madelyn Flavors PT 11/23/2020, 12:47 PM  [facility] [address] [place], [place], [address] Phone: [phone]   Fax:  [phone]  Name: Andrew Jones MRN: [id number] Date of Birth: [birth year]

## 2020-11-23 NOTE — Patient Instructions (Signed)
Access Code: [email or web address] URL: [email or web address] Date: 11/23/2020 Prepared by: Almyra Free  Exercises Seated Figure 4 Piriformis Stretch - 3 x daily - 7 x weekly - 1 sets - 3 reps - 20-30 hold Seated Hamstring Stretch - 3 x daily - 7 x weekly - 1 sets - 3 reps - 20-30 hold Supine Bridge - 1 x daily - 7 x weekly - 1-2 sets - 10 reps - 5 hold Standing Hip Abduction with Unilateral Counter Support - 2 x daily - 7 x weekly - 2 sets - 10 reps Sit to Stand Without Arm Support - 2 x daily - 7 x weekly - 2 sets - 10 reps Single Leg Heel Raise with Counter Support - 1 x daily - 3 x weekly - 1-3 sets - 10 reps Half Tandem Stance Balance with Head Rotation - 1 x daily - 7 x weekly - 1 sets - 10 reps

## 2020-11-29 ENCOUNTER — Encounter: Payer: Self-pay | Admitting: Physical Therapy

## 2020-11-29 ENCOUNTER — Other Ambulatory Visit: Payer: Self-pay

## 2020-11-29 ENCOUNTER — Ambulatory Visit: Payer: Medicare Other | Admitting: Physical Therapy

## 2020-11-29 DIAGNOSIS — M6281 Muscle weakness (generalized): Secondary | ICD-10-CM | POA: Diagnosis not present

## 2020-11-29 DIAGNOSIS — R2689 Other abnormalities of gait and mobility: Secondary | ICD-10-CM | POA: Diagnosis not present

## 2020-11-29 NOTE — Therapy (Signed)
[facility] [address], [place], [place], [address] Phone: [phone]   Fax:  [phone]  Physical Therapy Treatment  Patient Details  Name: Andrew Jones MRN: [id number] Date of Birth: 1941/02/03 Referring Provider (PT): Antony Contras, MD   Encounter Date: 11/29/2020   PT End of Session - 11/29/20 1401     Visit Number 3    Date for PT Re-Evaluation 01/17/21    Authorization Type BCBS Medicare    Progress Note Due on Visit 10    PT Start Time 1401    PT Stop Time 1440    PT Time Calculation (min) 39 min    Activity Tolerance Patient tolerated treatment well    Behavior During Therapy [facility] for tasks assessed/performed             Past Medical History:  Diagnosis Date   Ankylosing spondylitis ([place])    back, hips - on embrel 2000-2008, resumed 12/2012   CVA (cerebral vascular accident) ([place]) 1999, 2002   no residual problems   Diabetes mellitus, type 2 ([place])    Dyslipidemia    Hypertension    Osteoarthritis    Seizure disorder ([place]) 05/2009   Sleep apnea    uses c-pap    Past Surgical History:  Procedure Laterality Date   NO PAST SURGERIES      There were no vitals filed for this visit.   Subjective Assessment - 11/29/20 1405     Subjective I am ok today.    Pertinent History peripheral neuropathy, ankylosing spondylitis, DM, HTN    Currently in Pain? No/denies    Multiple Pain Sites No                               [facility] Adult PT Treatment/Exercise - 11/29/20 0001       Neuro Re-ed    Neuro Re-ed Details  Tandem with head turns both sides 10x, light touch then second set no UE, VC to be aware of feeling feet into the floor: alt toe taps 30 sec 2x with and without UE      Knee/Hip Exercises: Aerobic   Nustep L 2 x 5 min warm up with PTA present      Knee/Hip Exercises: Standing   Heel Raises Limitations Bil with light touch 10x, single leg 10x Bil light touch       Knee/Hip Exercises: Seated   Sit to Sand --   5x from chair no UE, hold 5# KB 10x     Knee/Hip Exercises: Supine   Bridges AROM;Strengthening;Both;1 set;10 reps   VC to "reach knees" to far wall for > stretch anteriorly                 Upper Extremity Functional Index Score :   /80     PT Short Term Goals - 11/29/20 1411       PT SHORT TERM GOAL #1   Title be independent in initial HEP    Time 4    Period Weeks    Status Achieved    Target Date 12/20/20               PT Long Term Goals - 11/22/20 1436       PT LONG TERM GOAL #1   Title be independent in advanced HEP    Time 8    Period Weeks    Status New  Target Date 01/17/21      PT LONG TERM GOAL #2   Title perform 5x sit to stand in < or = to 11 seconds to reduce falls risk    Time 8    Period Weeks    Status New    Target Date 01/17/21      PT LONG TERM GOAL #3   Title perform single leg stance on the Rt and Lt 8-10 seconds to reduce falls risk    Time 8    Period Weeks    Status New    Target Date 01/17/21      PT LONG TERM GOAL #4   Title improve LE strength to walk for > or = to 15-20 minutes without limitation while using walking sticks    Time 8    Period Weeks    Status New    Target Date 01/17/21      PT LONG TERM GOAL #5   Title demonstrate 4+/5 bil hip strength to improve stability with gait and mobility    Time 8    Period Weeks    Status New    Target Date 01/17/21                   Plan - 11/29/20 1408     Clinical Impression Statement Pt independent and compliant with HEP. Pt had some questions regarding tandem stance exercise which were addressed today. Pt demonstrates improved step length and overall steadiness. Pt is wobbly in tandem if not holding on to something, but can do without falling.    Personal Factors and Comorbidities Age;Comorbidity 2    Comorbidities peripheral neuropathy, ankylosing spondylsosis, HTN, DM    Examination-Activity  Limitations Locomotion Level;Stairs;Stand;Transfers    Examination-Participation Restrictions Community Activity;Shop;Meal Prep    Stability/Clinical Decision Making Evolving/Moderate complexity    Rehab Potential Good    PT Frequency 2x / week    PT Duration 8 weeks    PT Treatment/Interventions ADLs/Self Care Home Management;Gait training;Stair training;Functional mobility training;Therapeutic activities;Therapeutic exercise;Balance training;Neuromuscular re-education;Manual techniques;Patient/family education;Passive range of motion;Taping    PT Next Visit Plan gait training, balance, hip and knee strength    PT Home Exercise Plan Access Code: [email or web address]    Consulted and Agree with Plan of Care Patient             Patient will benefit from skilled therapeutic intervention in order to improve the following deficits and impairments:  Abnormal gait, Decreased activity tolerance, Decreased balance, Decreased mobility, Postural dysfunction, Impaired flexibility, Decreased endurance, Decreased range of motion  Visit Diagnosis: Muscle weakness (generalized)  Other abnormalities of gait and mobility     Problem List Patient Active Problem List   Diagnosis Date Noted   Obesity 07/03/2020   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   Chronic kidney disease, stage III (moderate) (HCC) 07/03/2020   Low magnesium level 04/29/2019   Seborrheic keratosis 05/15/2018   Hearing loss 12/01/2017   BPH (benign prostatic hypertrophy) 12/20/2014   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[place]) 12/20/2014   PSA elevation 12/20/2014   Encounter for therapeutic drug monitoring 12/08/2012   Diabetes mellitus, type 2 (HCC)    Seizure disorder (HCC)    Hypertension    Hyperlipidemia with target LDL less than 70    CVA (cerebral vascular accident) ([place])    Cluster headaches    Osteoarthritis     Danelia Snodgrass, PTA 11/29/2020, 2:53 PM  [facility] [address]  [place], [place], [place], [address] Phone: [phone]   Fax:  [phone]  Name: Andrew Jones MRN: [id number] Date of Birth: March 02, 1941

## 2020-12-06 ENCOUNTER — Ambulatory Visit: Payer: Medicare Other | Admitting: Physical Therapy

## 2020-12-06 ENCOUNTER — Other Ambulatory Visit: Payer: Self-pay

## 2020-12-06 ENCOUNTER — Encounter: Payer: Self-pay | Admitting: Physical Therapy

## 2020-12-06 DIAGNOSIS — M6281 Muscle weakness (generalized): Secondary | ICD-10-CM | POA: Diagnosis not present

## 2020-12-06 DIAGNOSIS — R2689 Other abnormalities of gait and mobility: Secondary | ICD-10-CM | POA: Diagnosis not present

## 2020-12-06 NOTE — Therapy (Signed)
[facility] [address] [place], [place], [address] Phone: [phone]   Fax:  [phone]  Physical Therapy Treatment  Patient Details  Name: Andrew Jones MRN: [id number] Date of Birth: [birth year] Referring Provider (PT): Antony Contras, MD   Encounter Date: 12/06/2020   PT End of Session - 12/06/20 1444     Visit Number 4    Date for PT Re-Evaluation 01/17/21    Authorization Type BCBS Medicare    Progress Note Due on Visit 10    PT Start Time 1444    PT Stop Time 1525    PT Time Calculation (min) 41 min    Activity Tolerance Patient tolerated treatment well    Behavior During Therapy [facility] for tasks assessed/performed             Past Medical History:  Diagnosis Date   Ankylosing spondylitis ([place])    back, hips - on embrel 2000-2008, resumed 12/2012   CVA (cerebral vascular accident) ([place]) 1999, 2002   no residual problems   Diabetes mellitus, type 2 ([place])    Dyslipidemia    Hypertension    Osteoarthritis    Seizure disorder ([place]) 05/2009   Sleep apnea    uses c-pap    Past Surgical History:  Procedure Laterality Date   NO PAST SURGERIES      There were no vitals filed for this visit.   Subjective Assessment - 12/06/20 1446     Subjective My leg strength and balance are getting much better. I am walking 8 min now. When I am up longer my back gets very fatigued.    Pertinent History peripheral neuropathy, ankylosing spondylitis, DM, HTN    Multiple Pain Sites No                               [facility] Adult PT Treatment/Exercise - 12/06/20 0001       Lumbar Exercises: Stretches   Lower Trunk Rotation 5 reps;10 seconds   VC to keep shoulders down and not roll     Lumbar Exercises: Seated   Other Seated Lumbar Exercises Red band arm lifts for Multifidus 2x5 5 sec hold, no back support      Knee/Hip Exercises: Aerobic   Nustep L2 x 6 min with PTA present to discuss  status      Knee/Hip Exercises: Standing   Heel Raises Limitations Bil with light touch 15x, single leg 15x Bil light touch    Other Standing Knee Exercises Weight shift forward onto colored disc 2x10 Bil SBA      Knee/Hip Exercises: Seated   Sit to Sand 2 sets;10 reps;without UE support   black mat in chair holding 5# KB     Knee/Hip Exercises: Supine   Bridges with Clamshell 10 reps;Both   marching   Other Supine Knee/Hip Exercises yellow loopclamshell 10x with TA emphasis                     PT Education - 12/06/20 1514     Education Details HEp progressed    Person(s) Educated Patient    Methods Explanation;Demonstration;Verbal cues;Handout    Comprehension Returned demonstration;Verbalized understanding              PT Short Term Goals - 12/06/20 1448       PT SHORT TERM GOAL #2   Title demonstrate normalized step length with gait on  level surface > 50% of the time while in the clinic    Time 4    Period Weeks    Status Achieved    Target Date 12/20/20      PT SHORT TERM GOAL #4   Title improve LE strength and endurance to walk for 10 minutes for exercise without rest    Time 4    Period Weeks    Status On-going   8 min              PT Long Term Goals - 11/22/20 1436       PT LONG TERM GOAL #1   Title be independent in advanced HEP    Time 8    Period Weeks    Status New    Target Date 01/17/21      PT LONG TERM GOAL #2   Title perform 5x sit to stand in < or = to 11 seconds to reduce falls risk    Time 8    Period Weeks    Status New    Target Date 01/17/21      PT LONG TERM GOAL #3   Title perform single leg stance on the Rt and Lt 8-10 seconds to reduce falls risk    Time 8    Period Weeks    Status New    Target Date 01/17/21      PT LONG TERM GOAL #4   Title improve LE strength to walk for > or = to 15-20 minutes without limitation while using walking sticks    Time 8    Period Weeks    Status New    Target Date  01/17/21      PT LONG TERM GOAL #5   Title demonstrate 4+/5 bil hip strength to improve stability with gait and mobility    Time 8    Period Weeks    Status New    Target Date 01/17/21                   Plan - 12/06/20 1445     Clinical Impression Statement pt reports his leg strength is improving, his walking and balance are better and he can now walk for 8 min ( goal is 10). Pt also reports as he is doing more standing and walking he notices his back fatigues. For this reason we incorporated a core focus to his exercises today.    Personal Factors and Comorbidities Age;Comorbidity 2    Comorbidities peripheral neuropathy, ankylosing spondylsosis, HTN, DM    Examination-Activity Limitations Locomotion Level;Stairs;Stand;Transfers    Examination-Participation Restrictions Community Activity;Shop;Meal Prep    Stability/Clinical Decision Making Evolving/Moderate complexity    Rehab Potential Good    PT Frequency 2x / week    PT Duration 8 weeks    PT Treatment/Interventions ADLs/Self Care Home Management;Gait training;Stair training;Functional mobility training;Therapeutic activities;Therapeutic exercise;Balance training;Neuromuscular re-education;Manual techniques;Patient/family education;Passive range of motion;Taping    PT Next Visit Plan gait training, balance, hip and knee strength    PT Home Exercise Plan Access Code: [email or web address]    Consulted and Agree with Plan of Care Patient             Patient will benefit from skilled therapeutic intervention in order to improve the following deficits and impairments:  Abnormal gait, Decreased activity tolerance, Decreased balance, Decreased mobility, Postural dysfunction, Impaired flexibility, Decreased endurance, Decreased range of motion  Visit Diagnosis: Muscle weakness (generalized)  Other abnormalities of gait and mobility  Problem List Patient Active Problem List   Diagnosis Date Noted   Obesity 07/03/2020    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   Chronic kidney disease, stage III (moderate) (HCC) 07/03/2020   Low magnesium level 04/29/2019   Seborrheic keratosis 05/15/2018   Hearing loss 12/01/2017   BPH (benign prostatic hypertrophy) 12/20/2014   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[place]) 12/20/2014   PSA elevation 12/20/2014   Encounter for therapeutic drug monitoring 12/08/2012   Diabetes mellitus, type 2 (HCC)    Seizure disorder (HCC)    Hypertension    Hyperlipidemia with target LDL less than 70    CVA (cerebral vascular accident) ([place])    Cluster headaches    Osteoarthritis     Rainie Crenshaw, PTA 12/06/2020, 3:28 PM  [facility] [address] [place], [place], [address] Phone: [phone]   Fax:  [phone]  Name: Andrew Jones MRN: [id number] Date of Birth: November 06, 1940

## 2020-12-11 ENCOUNTER — Other Ambulatory Visit: Payer: Self-pay

## 2020-12-11 ENCOUNTER — Encounter: Payer: Self-pay | Admitting: Physical Therapy

## 2020-12-11 ENCOUNTER — Ambulatory Visit: Payer: Medicare Other | Admitting: Physical Therapy

## 2020-12-11 DIAGNOSIS — R2689 Other abnormalities of gait and mobility: Secondary | ICD-10-CM

## 2020-12-11 DIAGNOSIS — M6281 Muscle weakness (generalized): Secondary | ICD-10-CM | POA: Diagnosis not present

## 2020-12-11 NOTE — Therapy (Signed)
[facility] [address] [place], [place], [address] Phone: [phone]   Fax:  [phone]  Physical Therapy Treatment  Patient Details  Name: Andrew Jones MRN: [id number] Date of Birth: 23-Jun-1940 Referring Provider (PT): Antony Contras, MD   Encounter Date: 12/11/2020   PT End of Session - 12/11/20 1400     Visit Number 5    Date for PT Re-Evaluation 01/17/21    Authorization Type BCBS Medicare    Progress Note Due on Visit 10    PT Start Time 1400   Pt limited by neurologic fatigue   PT Stop Time 1436    PT Time Calculation (min) 36 min    Activity Tolerance Patient tolerated treatment well;Treatment limited secondary to medical complications (Comment);Patient limited by fatigue    Behavior During Therapy [facility] for tasks assessed/performed             Past Medical History:  Diagnosis Date   Ankylosing spondylitis ([place])    back, hips - on embrel 2000-2008, resumed 12/2012   CVA (cerebral vascular accident) ([place]) 1999, 2002   no residual problems   Diabetes mellitus, type 2 ([place])    Dyslipidemia    Hypertension    Osteoarthritis    Seizure disorder ([place]) 05/2009   Sleep apnea    uses c-pap    Past Surgical History:  Procedure Laterality Date   NO PAST SURGERIES      There were no vitals filed for this visit.   Subjective Assessment - 12/11/20 1401     Subjective Something happened over the weekend: "I do not have control of my legs and feet. They will not do what I want them to do. I have been using the cane because i keep falling into the walls.    Pertinent History peripheral neuropathy, ankylosing spondylitis, DM, HTN    Currently in Pain? Yes    Pain Score 5     Pain Location Back    Pain Orientation Lower    Pain Descriptors / Indicators Sore    Pain Relieving Factors Sitting with a lumbar cushion    Multiple Pain Sites No                               [facility]  Adult PT Treatment/Exercise - 12/11/20 0001       Knee/Hip Exercises: Aerobic   Nustep L1 x 6 min with PTA present      Knee/Hip Exercises: Standing   Heel Raises Both;1 set;15 reps    Heel Raises Limitations Held single leg today      Knee/Hip Exercises: Seated   Sit to Sand 10 reps;without UE support   pt demonstrated a significant "lurch" ( LOB) mid way through reps. He says "this is what happens..."     Knee/Hip Exercises: Supine   Bridges with Clamshell Both;15 reps   marching   Other Supine Knee/Hip Exercises yellow loopclamshell 15x with TA emphasis    Other Supine Knee/Hip Exercises post pelvic tilt 5 sec hold 10x      Knee/Hip Exercises: Sidelying   Hip ABduction AROM;Strengthening;Both;1 set;10 reps                       PT Short Term Goals - 12/06/20 1448       PT SHORT TERM GOAL #2   Title demonstrate normalized step length with gait on level surface >  50% of the time while in the clinic    Time 4    Period Weeks    Status Achieved    Target Date 12/20/20      PT SHORT TERM GOAL #4   Title improve LE strength and endurance to walk for 10 minutes for exercise without rest    Time 4    Period Weeks    Status On-going   8 min              PT Long Term Goals - 11/22/20 1436       PT LONG TERM GOAL #1   Title be independent in advanced HEP    Time 8    Period Weeks    Status New    Target Date 01/17/21      PT LONG TERM GOAL #2   Title perform 5x sit to stand in < or = to 11 seconds to reduce falls risk    Time 8    Period Weeks    Status New    Target Date 01/17/21      PT LONG TERM GOAL #3   Title perform single leg stance on the Rt and Lt 8-10 seconds to reduce falls risk    Time 8    Period Weeks    Status New    Target Date 01/17/21      PT LONG TERM GOAL #4   Title improve LE strength to walk for > or = to 15-20 minutes without limitation while using walking sticks    Time 8    Period Weeks    Status New    Target  Date 01/17/21      PT LONG TERM GOAL #5   Title demonstrate 4+/5 bil hip strength to improve stability with gait and mobility    Time 8    Period Weeks    Status New    Target Date 01/17/21                   Plan - 12/11/20 1410     Clinical Impression Statement Pt arrives with reports of "something happened over the weekend, someting changed in the way my legs and feet are working ( or not working.) Pt reports the feeling of no control regarding his LE and has resumed using his cane for fear of falling. Pt was limited to mat exercises today and plans to call his MD to see if he can get in before his next appt end of OCT. PTA pondered to pt that it is possible he is doing too much HEP and potentialy taxing his neurologic system?? PTA suggested he cut all his HEP back by 50% ( at least ) and see if that is helpful.    Personal Factors and Comorbidities Age;Comorbidity 2    Comorbidities peripheral neuropathy, ankylosing spondylsosis, HTN, DM    Examination-Activity Limitations Locomotion Level;Stairs;Stand;Transfers    Examination-Participation Restrictions Community Activity;Shop;Meal Prep    Stability/Clinical Decision Making Evolving/Moderate complexity    Rehab Potential Good    PT Frequency 2x / week    PT Duration 8 weeks    PT Treatment/Interventions ADLs/Self Care Home Management;Gait training;Stair training;Functional mobility training;Therapeutic activities;Therapeutic exercise;Balance training;Neuromuscular re-education;Manual techniques;Patient/family education;Passive range of motion;Taping    PT Next Visit Plan See how pt did cutting back his HEP by 50%.    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   Chronic kidney disease, stage III (moderate) (HCC) 07/03/2020   Low magnesium level 04/29/2019   Seborrheic keratosis 05/15/2018   Hearing loss 12/01/2017   BPH (benign prostatic hypertrophy) 12/20/2014   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[place]) 12/20/2014   PSA elevation 12/20/2014   Encounter for therapeutic drug monitoring 12/08/2012   Diabetes mellitus, type 2 (HCC)    Seizure disorder (HCC)    Hypertension    Hyperlipidemia with target LDL less than 70    CVA (cerebral vascular accident) ([place])    Cluster headaches    Osteoarthritis     Darald Uzzle, PTA 12/11/2020, 2:37 PM  [facility] [address] [place], [place], [address] Phone: [phone]   Fax:  [phone]  Name: Andrew Jones MRN: [id number] Date of Birth: 1940-11-08

## 2020-12-13 ENCOUNTER — Encounter: Payer: Medicare Other | Admitting: Physical Therapy

## 2020-12-18 ENCOUNTER — Encounter: Payer: Medicare Other | Admitting: Physical Therapy

## 2020-12-20 ENCOUNTER — Encounter: Payer: Medicare Other | Admitting: Physical Therapy

## 2020-12-26 ENCOUNTER — Encounter: Payer: Medicare Other | Admitting: Physical Therapy

## 2020-12-26 ENCOUNTER — Encounter: Payer: Self-pay | Admitting: Neurology

## 2020-12-26 ENCOUNTER — Ambulatory Visit (INDEPENDENT_AMBULATORY_CARE_PROVIDER_SITE_OTHER): Payer: Medicare Other

## 2020-12-26 ENCOUNTER — Other Ambulatory Visit: Payer: Self-pay

## 2020-12-26 ENCOUNTER — Ambulatory Visit: Payer: Medicare Other | Admitting: Neurology

## 2020-12-26 VITALS — BP 160/67 | HR 79 | Ht 68.0 in | Wt 186.0 lb

## 2020-12-26 DIAGNOSIS — R29898 Other symptoms and signs involving the musculoskeletal system: Secondary | ICD-10-CM

## 2020-12-26 DIAGNOSIS — Z23 Encounter for immunization: Secondary | ICD-10-CM

## 2020-12-26 DIAGNOSIS — R42 Dizziness and giddiness: Secondary | ICD-10-CM

## 2020-12-26 NOTE — Progress Notes (Signed)
[facility] [address] [place]. [place] [address] [phone]       OFFICE FOLLOW UP VISIT NOTE  [name] Date of Birth:  March 17, 1941 Medical Record Number:  [id number]   Referring MD: Arlester Marker  Reason for Referral: Proximal weakness  HPI: Initial visit 09/20/2020 :[name] is a pleasant 80 year old Caucasian male seen today for office consultation visit for proximal leg weakness.  He is accompanied by his daughter-in-law Andrew Jones.  History is obtained from them and review of electronic medical records and I personally reviewed available imaging films which are pertinent in PACS.  He has past medical history of diabetes, hypertension, seizures, sleep apnea, arthritis stroke.  He states he has noticed sudden onset of bilateral lower extremity proximal weakness for the last couple of months.  This happened shortly after he had had COVID infection in February.  He has noticed difficulty getting out of a chair requiring push himself up.  This is in both legs.  Denies any muscle pains, cramps and has not noticed any fasciculation at 1 2 movements.  Feels his balance is also slightly poor.  He has some good days and bad days.  He has been using a cane and can walk quite well and has had no falls or injuries.  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  He denies any weakness in his hands and has not dropping objects or noticed any decrease in his grip strength.  His diabetes has been under good control and last hemoglobin A1c was 6.0.  Patient was on a different statin in about 9 months ago was switched to Lipitor 40 mg which he is taking 3 times a week.  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 Comprehensive metabolic panel on 5/85/2778 was fairly unremarkable except for low sodium of  134.  TSH is normal.  LDL cholesterol was 129 mg percent.  Hemoglobin A1c was 6.1.  He denies any back pain, radicular pain, falls or injury. He has past neurological history of seizures since March 2011 and has followed up in with me in the office with the last visit being on 12/14/2018.  He is on Dilantin 200 mg daily and has not had a breakthrough seizure for more than 10 years. Update 12/26/2020 : He returns for follow-up after last visit 3 months ago.  He states he is doing slightly better his has stopped Lipitor 3 months ago and is noted improvement in myalgias and thigh pain however he still has some proximal weakness.  He has mild tingling numbness in his feet but states his gait and balance are unchanged.  He uses a cane for long distances.  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  Lab work on 09/20/2020 showed normal aldolase, CK and LDH.  C-reactive protein and ESR were also normal.  Vitamin D level was slightly low at 28.2 and phenytoin level was also low at 6.3.  Patient has not yet made an appointment to see his primary care physician to increase his vitamin D dose.  He has a new complaint of positional transient dizziness which he describes as vertigo sensation when he turns his neck quickly to the right.  He is seeing his audiologist who is  recommending vestibular stabilization exercises.  He has not had a seizure since 2011 and remains on Dilantin 300 mg daily and is willing to taper and stop it over the next few months. ROS:   14 system review of systems is positive for thigh weakness, tingling, numbness, imbalance, gait difficulty, history of seizures all other systems negative  PMH:  Past Medical History:  Diagnosis Date   Ankylosing spondylitis ([place])    back, hips - on embrel 2000-2008, resumed 12/2012   CVA (cerebral vascular accident) ([place] Jones) 1999, 2002   no residual problems   Diabetes  mellitus, type 2 (HCC)    Dyslipidemia    Hypertension    Osteoarthritis    Seizure disorder ([place]) 05/2009   Sleep apnea    uses c-pap    Social History:  Social History   Socioeconomic History   Marital status: Married    Spouse name: Not on file   Number of children: 1   Years of education: 13   Highest education level: Not on file  Occupational History   Occupation: Retired  Tobacco Use   Smoking status: Former   Smokeless tobacco: Never   Tobacco comments:    30 yrs ago  Scientific laboratory technician Use: Never used  Substance and Sexual Activity   Alcohol use: Not Currently   Drug use: No   Sexual activity: Yes  Other Topics Concern   Not on file  Social History Narrative   Lives with wife   Right Handed   Drinks 2-3 cups caffeine daily   Social Determinants of Health   Financial Resource Strain: Not on file  Food Insecurity: Not on file  Transportation Needs: Not on file  Physical Activity: Not on file  Stress: Not on file  Social Connections: Not on file  Intimate Partner Violence: Not on file    Medications:   Current Outpatient Medications on File Prior to Visit  Medication Sig Dispense Refill   amLODipine (NORVASC) 10 MG tablet Take 1 tablet (10 mg total) by mouth daily.     aspirin EC 81 MG tablet Take 1 tablet (81 mg total) by mouth daily. Swallow whole. 30 tablet 11   atorvastatin (LIPITOR) 40 MG tablet Take 1 tablet (40 mg total) by mouth 3 (three) times a week. 45 tablet 3   benazepril (LOTENSIN) 10 MG tablet Take 10 mg by mouth daily.     cholecalciferol (VITAMIN D) 1000 UNITS tablet Take 1,000 Units by mouth daily.     diclofenac (VOLTAREN) 75 MG EC tablet Take 1 tablet (75 mg total) by mouth daily. 30 tablet 5   etanercept (ENBREL) 50 MG/ML injection Inject 50 mg into the skin once a week.     fish oil-omega-3 fatty acids 1000 MG capsule Take 2 g by mouth daily.     Magnesium Oxide 420 MG TABS Take 1 tablet (420 mg total) by mouth daily after  breakfast.  0   metFORMIN (GLUCOPHAGE) 1000 MG tablet Take 1,000 mg by mouth 2 (two) times daily with a meal.     metoprolol tartrate (LOPRESSOR) 25 MG tablet Take 25 mg by mouth 2 (two) times daily.     Multiple Vitamin (MULTIVITAMIN) tablet Take 1 tablet by mouth daily.     omeprazole (PRILOSEC) 20 MG capsule Take 20 mg by mouth daily.     phenytoin (DILANTIN) 100 MG ER capsule Take 100 mg by mouth. 1 capsule twice daily into 2 months then 1 capsule daily into 2  months and then stop     pioglitazone (ACTOS) 15 MG tablet Take 15 mg by mouth daily.     sulfaSALAzine (AZULFIDINE) 500 MG tablet Take 500 mg by mouth 3 (three) times daily.     No current facility-administered medications on file prior to visit.    Allergies:  No Known Allergies  Physical Exam General: well developed, well nourished elderly Caucasian male, seated, in no evident distress Head: head normocephalic and atraumatic.   Neck: supple with no carotid or supraclavicular bruits Cardiovascular: regular rate and rhythm, no murmurs Musculoskeletal: no deformity Skin:  no rash/petichiae Vascular:  Normal pulses all extremities  Neurologic Exam Mental Status: Awake and fully alert. Oriented to place and time. Recent and remote memory intact. Attention span, concentration and fund of knowledge appropriate. Mood and affect appropriate.  Cranial Nerves: Fundoscopic exam not done s. Pupils equal, briskly reactive to light. Extraocular movements full without nystagmus. Visual fields full to confrontation. Hearing intact. Facial sensation intact. Face, tongue, palate moves normally and symmetrically.  Motor: Normal bulk and tone. Normal strength in all tested extremity muscles.  Mild weakness of bilateral hip flexors 4/5.  Right greater than left.  No muscle wasting noted.  No fasciculations noted. Sensory.:  Mildly diminished touch , pinprick , position and vibratory sensation.  In both legs from ankle down. Coordination: Rapid  alternating movements normal in all extremities. Finger-to-nose and heel-to-shin performed accurately bilaterally. Gait and Station: Arises from chair with slight difficulty. Stance is normal. Gait demonstrates normal stride length and balance . Able to heel, toe and tandem walk without difficulty.  Uses a cane for ambulation. Reflexes: 1+ and symmetric. Toes downgoing.      ASSESSMENT: 80 year old Caucasian male with sudden onset of proximal lower extremity weakness  closely following COVID infection of unclear etiology.  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  He has shown slight improvement after stopping statins.  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  I have discussed results of EMG nerve conduction studies as well as lab work with him.  I recommend he see his primary care physician and increase the dose of vitamin D due to low levels.  I also recommend he taper Dilantin as he has not had any seizures for more than 11 years and had only 1 to begin with.  Reduce the dose of Dilantin 200 mg twice daily for 2 months then 100 mg once daily for 2 months and then stop.  If he has any breakthrough seizures may need to restart it.  I also recommend he see his audiologist for dizziness exercises for his benign paroxysmal positional vertigo.  Return for follow-up to see me in 3 months or call earlier if necessary. Greater than 50% time during this 25-minute   visit was spent on counseling and coordination of care about his proximal leg weakness and discussion of differential diagnosis and evaluation  plan and answering questions. Antony Contras, MD  Note: This document was prepared with digital dictation and possible smart phrase technology. Any transcriptional errors that result from this process are unintentional.

## 2020-12-26 NOTE — Patient Instructions (Signed)
After obtaining informed consent, the high dose flu immunization was given by [place], pt informed to stay in office for 20 min for any adverse reactions. Pt tolerated injection well.

## 2020-12-26 NOTE — Patient Instructions (Signed)
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  I have discussed results of EMG nerve conduction studies as well as lab work with him.  I recommend he see his primary care physician and increase the dose of vitamin D due to low levels.  I also recommend he taper Dilantin as he has not had any seizures for more than 11 years and had only 1 to begin with.  Reduce the dose of Dilantin 200 mg twice daily for 2 months then 100 mg once daily for 2 months and then stop.  If he has any breakthrough seizures may need to restart it.  I also recommend he see his audiologist for dizziness exercises for his benign paroxysmal positional vertigo.  Return for follow-up to see me in 3 months or call earlier if necessary. Benign Positional Vertigo Vertigo is the feeling that you or your surroundings are moving when they are not. Benign positional vertigo is the most common form of vertigo. This is usually a harmless condition (benign). This condition is positional. This means that symptoms are triggered by certain movements and positions. This condition can be dangerous if it occurs while you are doing something that could cause harm to yourself or others. This includes activities such as driving or operating machinery. What are the causes? The inner ear has fluid-filled canals that help your brain sense movement and balance. When the fluid moves, the brain receives messages about your body's position. With benign positional vertigo, calcium crystals in the inner ear break free and disturb the inner ear area. This causes your brain to receive confusing messages about your body's position. What increases the risk? You are more likely to develop this condition if: You are a woman. You are 25 years of age or older. You have recently had a head injury. You have an inner ear disease. What  are the signs or symptoms? Symptoms of this condition usually happen when you move your head or your eyes in different directions. Symptoms may start suddenly and usually last for less than a minute. They include: Loss of balance and falling. Feeling like you are spinning or moving. Feeling like your surroundings are spinning or moving. Nausea and vomiting. Blurred vision. Dizziness. Involuntary eye movement (nystagmus). Symptoms can be mild and cause only minor problems, or they can be severe and interfere with daily life. Episodes of benign positional vertigo may return (recur) over time. Symptoms may also improve over time. How is this diagnosed? This condition may be diagnosed based on: Your medical history. A physical exam of the head, neck, and ears. Positional tests to check for or stimulate vertigo. You may be asked to turn your head and change positions, such as going from sitting to lying down. A health care provider will watch for symptoms of vertigo. You may be referred to a health care provider who specializes in ear, nose, and throat problems (ENT or otolaryngologist) or a provider who specializes in disorders of the nervous system (neurologist). How is this treated? This condition may be treated in a session in which your health care provider moves your head in specific positions to help the displaced crystals in your inner ear move. Treatment for this condition may take several sessions. Surgery may be needed in severe cases, but this is rare. In some cases, benign positional vertigo may resolve on its own in 2-4 weeks.  Follow these instructions at home: Safety Move slowly. Avoid sudden body or head movements or certain positions, as told by your health care provider. Avoid driving or operating machinery until your health care provider says it is safe. Avoid doing any tasks that would be dangerous to you or others if vertigo occurs. If you have trouble walking or keeping your  balance, try using a cane for stability. If you feel dizzy or unstable, sit down right away. Return to your normal activities as told by your health care provider. Ask your health care provider what activities are safe for you. General instructions Take over-the-counter and prescription medicines only as told by your health care provider. Drink enough fluid to keep your urine pale yellow. Keep all follow-up visits. This is important. Contact a health care provider if: You have a fever. Your condition gets worse or you develop new symptoms. Your family or friends notice any behavioral changes. You have nausea or vomiting that gets worse. You have numbness or a prickling and tingling sensation. Get help right away if you: Have difficulty speaking or moving. Are always dizzy or faint. Develop severe headaches. Have weakness in your legs or arms. Have changes in your hearing or vision. Develop a stiff neck. Develop sensitivity to light. These symptoms may represent a serious problem that is an emergency. Do not wait to see if the symptoms will go away. Get medical help right away. Call your local emergency services (911 in the [place] Do not drive yourself to the hospital. Summary Vertigo is the feeling that you or your surroundings are moving when they are not. Benign positional vertigo is the most common form of vertigo. This condition is caused by calcium crystals in the inner ear that become displaced. This causes a disturbance in an area of the inner ear that helps your brain sense movement and balance. Symptoms include loss of balance and falling, feeling that you or your surroundings are moving, nausea and vomiting, and blurred vision. This condition can be diagnosed based on symptoms, a physical exam, and positional tests. Follow safety instructions as told by your health care provider and keep all follow-up visits. This is important. This information is not intended to replace advice  given to you by your health care provider. Make sure you discuss any questions you have with your health care provider. Document Revised: 02/09/2020 Document Reviewed: 02/09/2020 Elsevier Patient Education  2022 [employer].

## 2020-12-27 ENCOUNTER — Telehealth: Payer: Self-pay | Admitting: Nurse Practitioner

## 2020-12-27 DIAGNOSIS — E559 Vitamin D deficiency, unspecified: Secondary | ICD-10-CM

## 2020-12-27 MED ORDER — VITAMIN D (ERGOCALCIFEROL) 1.25 MG (50000 UNIT) PO CAPS: 50000.0000 [IU] | ORAL_CAPSULE | ORAL | 2 refills | Status: DC

## 2020-12-27 NOTE — Telephone Encounter (Signed)
See phone note

## 2020-12-28 ENCOUNTER — Encounter: Payer: Medicare Other | Admitting: Physical Therapy

## 2020-12-28 ENCOUNTER — Telehealth: Payer: Self-pay | Admitting: Psychiatry

## 2020-12-28 DIAGNOSIS — H8110 Benign paroxysmal vertigo, unspecified ear: Secondary | ICD-10-CM

## 2021-01-01 ENCOUNTER — Encounter: Payer: Medicare Other | Admitting: Physical Therapy

## 2021-01-02 NOTE — Telephone Encounter (Signed)
ERROR

## 2021-01-03 ENCOUNTER — Encounter: Payer: Medicare Other | Admitting: Physical Therapy

## 2021-01-08 ENCOUNTER — Encounter: Payer: Self-pay | Admitting: Nurse Practitioner

## 2021-01-08 ENCOUNTER — Encounter: Payer: Medicare Other | Admitting: Physical Therapy

## 2021-01-09 NOTE — Telephone Encounter (Signed)
Per vo by Dr. Leonie Man, he will authorize order for PT for vestibular rehab for benign paroxysmal positional vertigo (BPPV). He suggested patient try this over ENT referral.  I spoke to the patient and he was in agreement with this plan.   Orders placed in Epic.

## 2021-01-14 ENCOUNTER — Encounter: Payer: Self-pay | Admitting: Nurse Practitioner

## 2021-01-16 ENCOUNTER — Encounter: Payer: Self-pay | Admitting: Physical Therapy

## 2021-01-16 ENCOUNTER — Ambulatory Visit: Payer: Medicare Other | Attending: Neurology | Admitting: Physical Therapy

## 2021-01-16 ENCOUNTER — Other Ambulatory Visit: Payer: Self-pay

## 2021-01-16 DIAGNOSIS — H8110 Benign paroxysmal vertigo, unspecified ear: Secondary | ICD-10-CM | POA: Insufficient documentation

## 2021-01-16 DIAGNOSIS — R42 Dizziness and giddiness: Secondary | ICD-10-CM | POA: Insufficient documentation

## 2021-01-16 DIAGNOSIS — R2681 Unsteadiness on feet: Secondary | ICD-10-CM | POA: Diagnosis not present

## 2021-01-16 DIAGNOSIS — M6281 Muscle weakness (generalized): Secondary | ICD-10-CM | POA: Insufficient documentation

## 2021-01-16 DIAGNOSIS — R2689 Other abnormalities of gait and mobility: Secondary | ICD-10-CM | POA: Diagnosis not present

## 2021-01-17 ENCOUNTER — Encounter: Payer: Self-pay | Admitting: Physical Therapy

## 2021-01-17 NOTE — Therapy (Signed)
[facility] [address] [place], [place], [address] Phone: [phone]   Fax:  [phone]  Physical Therapy Evaluation  Patient Details  Name: Andrew Jones MRN: [id number] Date of Birth: 11/26/1940 Referring Provider (PT): Garvin Fila, MD   Encounter Date: 01/16/2021   PT End of Session - 01/17/21 1312     Visit Number 1    Number of Visits 17    Date for PT Re-Evaluation 03/18/21    Authorization Type BCBS Medicare; $40 copay    Progress Note Due on Visit 10    PT Start Time 1537    PT Stop Time 1620    PT Time Calculation (min) 43 min    Activity Tolerance Patient tolerated treatment well    Behavior During Therapy [facility] for tasks assessed/performed             Past Medical History:  Diagnosis Date   Ankylosing spondylitis ([place])    back, hips - on embrel 2000-2008, resumed 12/2012   CVA (cerebral vascular accident) ([place]) 1999, 2002   no residual problems   Diabetes mellitus, type 2 ([place])    Dyslipidemia    Hypertension    Osteoarthritis    Seizure disorder ([place]) 05/2009   Sleep apnea    uses c-pap    Past Surgical History:  Procedure Laterality Date   NO PAST SURGERIES      There were no vitals filed for this visit.    Subjective Assessment - 01/16/21 1541     Subjective Pt reports he woke up in April with significant LE weakness and difficulty walking; no cause found so far.  Neurology felt it may be due to cholesterol medicine; D/C medicine with no significant change in LE strength.  Also started experiencing dizziness with head turns to the R.  In bed he can hear his heartbeat in his ear and feels full of fluid.  Prior to April pt was not using a cane and he was ambulating >2 miles a day. Uses cane in the home and walker for longer distances.    Pertinent History Ankylosing spondylitis, CVA in 1999/2001, DM type 2, dyslipidemia, HTN, OA, seizure disorder 2011, sleep apnea    Limitations  Walking;Standing    Diagnostic tests NCV: axonal sensorimotor polyneuropathy; MRI in May: No acute findings    Currently in Pain? No/denies                [facility] PT Assessment - 01/16/21 1545       Assessment   Medical Diagnosis Dizziness; LE weakness    Referring Provider (PT) Garvin Fila, MD    Onset Date/Surgical Date 01/09/21    Prior Therapy yes outpatient for LE weakness      Precautions   Precautions Other (comment)    Precaution Comments Ankylosing spondylitis, CVA in 1999/2001, DM type 2, dyslipidemia, HTN, OA, seizure disorder 2011, sleep apnea      Balance Screen   Has the patient fallen in the past 6 months No    Has the patient had a decrease in activity level because of a fear of falling?  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      Observation/Other Assessments   Focus on Therapeutic Outcomes (FOTO)  DFS: 45; DPS: 60.7      Sensation   Light Touch Impaired Detail    Light Touch Impaired Details Impaired RLE;Impaired LLE    Additional Comments absent sensation from ankles down due to neuropathy      Coordination   Gross Motor Movements are Fluid and Coordinated Yes    Finger Nose Finger Test [facility]    Heel Shin Test WFL      ROM / Strength   AROM / PROM / Strength Strength      Strength   Overall Strength Deficits    Overall Strength Comments LLE: 5/5; RLE: 4-/5 hip flexion, 4+/5 knee extension, 4-/5 knee flexion, ankle DF 4-/5.      Ambulation/Gait   Ambulation/Gait Yes    Ambulation/Gait Assistance 5:  Supervision    Ambulation Distance (Feet) 230 Feet    Assistive device Straight cane    Gait Pattern Step-through pattern;Decreased step length - right;Decreased step length - left;Decreased stride length;Decreased trunk rotation;Wide base of support    Ambulation Surface Level;Indoor                    Vestibular Assessment - 01/16/21 1556       Vestibular Assessment   General Observation Denies tinnitus or changes in vision; reports hearing heart beat and fullness in ears has cleared, denies nausea and vomiting, wears hearing aides and reports no significant change in hearing the past few months.  Pt reports he wakes up with pain on R side of his neck/shoulder      Symptom Behavior   Subjective history of current problem Began around May after LE weakness began, most noticable with head turns to the R    Type of Dizziness  "Funny feeling in head";Lightheadedness    Frequency of Dizziness Every time he turns his head to the R past 45 deg    Duration of Dizziness brief if he turns his head back to the middle    Symptom Nature Motion provoked    Aggravating Factors Turning head quickly   to the R   Relieving Factors Head stationary   not turning head to the R   Progression of Symptoms Worse      Oculomotor Exam   Oculomotor Alignment Abnormal    Ocular ROM WFL    Spontaneous Absent    Gaze-induced  Absent    Smooth Pursuits Intact    Saccades Intact    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      Positional Testing   Sidelying Test Sidelying Right;Sidelying Left    Horizontal Canal Testing Horizontal Canal Right;Horizontal  Canal Left      Sidelying Right   Sidelying Right Duration 0  Sidelying Right Symptoms No nystagmus      Sidelying Left   Sidelying Left Duration 0    Sidelying Left Symptoms No nystagmus      Horizontal Canal Right   Horizontal Canal Right Duration 0    Horizontal Canal Right Symptoms Normal      Horizontal Canal Left   Horizontal Canal Left Duration 0    Horizontal Canal Left Symptoms Normal      Environmental education officer Comment M-CTSIB:      Positional Sensitivities   Sit to Supine No dizziness    Supine to Left Side No dizziness    Supine to Right Side No dizziness    Supine to Sitting No dizziness    Nose to Right Knee No dizziness    Right Knee to Sitting No dizziness    Nose to Left Knee No dizziness    Left Knee to Sitting No dizziness    Head Turning x 5 No dizziness    Head Nodding x 5 No dizziness    Pivot Right in Standing No dizziness    Pivot Left in Standing No dizziness    Rolling Right No dizziness    Rolling Left No dizziness                Objective measurements completed on examination: See above findings.                PT Education - 01/17/21 1311     Education Details clinical findings, PT POC and goals, negative for BPPV    Person(s) Educated Patient    Methods Explanation    Comprehension Verbalized understanding              PT Short Term Goals - 01/17/21 1350       PT SHORT TERM GOAL #1   Title Pt will participate in further assessment of balance and falls risk (MCTSIB and FGA)    Time 4    Period Weeks    Status New    Target Date 02/16/21      PT SHORT TERM GOAL #2   Title Pt will demonstrate independence with initial vestibular and balance HEP    Time 4    Period Weeks    Status New    Target Date 02/16/21               PT Long Term Goals - 01/17/21 1353       PT LONG TERM GOAL #1   Title Pt will demonstrate independence with final vestibular, balance and LE strength HEP    Time  8    Period Weeks    Status New    Target Date 03/18/21      PT LONG TERM GOAL #2   Title Pt will demonstrate 4 point improvement in FGA to indicate decreased falls risk to improve safety with walking in community for exercise    Baseline TBD    Time 8    Period Weeks    Status New    Target Date 03/18/21      PT LONG TERM GOAL #3   Title MCTSIB goal as indicated    Baseline TBD    Time 8    Period Weeks    Status New    Target Date 03/18/21      PT LONG TERM GOAL #4   Title Pt will demonstrate improved VOR gain as indicated by 2 line difference on DVA  Baseline 3 line difference    Time 8    Period Weeks    Status New    Target Date 03/18/21      PT LONG TERM GOAL #5   Title Pt will report increase in FOTO to 53 on DFS and >/= 65 on DPS    Time 8    Period Weeks    Status New    Target Date 03/18/21      Additional Long Term Goals   Additional Long Term Goals Yes      PT LONG TERM GOAL #6   Title Pt will demonstrate ability to perform head turns to R in sitting or standing for visual scanning without symptoms of dizziness    Time 8    Period Weeks    Status New    Target Date 03/18/21                    Plan - 01/17/21 1314     Clinical Impression Statement Pt is a 80 year old male referred to Neuro OPPT for evaluation of dizziness and LE weakness.  Pt's PMH is significant for the following: Ankylosing spondylitis, CVA in 1999/2001, DM type 2, dyslipidemia, HTN, OA, seizure disorder 2011, sleep apnea. The following deficits were noted during pt's exam: dizziness with head turn >45 degrees to the R but does not occur when head is stationary and body rotates to L (cervicogenic dizziness not indicated), impaired VOR gain as indicated by positive HIT and 3 line difference on DVA, impaired LE strength R > L weakness, impaired standing balance and gait abnormalities.  Patient was negative for motion sensitivity and negative for nystagmus or vertigo with  positional testing.  Pt would benefit from skilled PT to address these impairments and functional limitations to maximize functional mobility independence and reduce falls risk.    Personal Factors and Comorbidities Comorbidity 3+;Fitness;Social Background    Comorbidities Ankylosing spondylitis, CVA in 1999/2001, DM type 2, peripheral neuropathy, dyslipidemia, HTN, OA, seizure disorder 2011, sleep apnea    Examination-Activity Limitations Locomotion Level;Stairs    Examination-Participation Restrictions Community Activity;Shop    Stability/Clinical Decision Making Evolving/Moderate complexity    Clinical Decision Making Moderate    Rehab Potential Good    PT Frequency 2x / week    PT Duration 8 weeks    PT Treatment/Interventions ADLs/Self Care Home Management;Aquatic Therapy;Canalith Repostioning;DME Instruction;Gait training;Stair training;Functional mobility training;Therapeutic activities;Therapeutic exercise;Balance training;Neuromuscular re-education;Patient/family education;Orthotic Fit/Training;Vestibular    PT Next Visit Plan Most Dizziness occurs with head turns to R.  Perform MCTSIB and FGA.  Initiate HEP focusing on VOR, LE strength, standing balance.    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   Chronic kidney disease, stage III (moderate) (HCC) 07/03/2020   Low magnesium level  04/29/2019   Seborrheic keratosis 05/15/2018   Hearing loss 12/01/2017   BPH (benign prostatic hypertrophy) 12/20/2014   Ankylosing  spondylitis of lumbar region [facility]) 12/20/2014   PSA elevation 12/20/2014   Encounter for therapeutic drug monitoring 12/08/2012   Diabetes mellitus, type 2 ([place])    Seizure disorder ([place])    Hypertension    Hyperlipidemia with target LDL less than 70    CVA (cerebral vascular accident) [facility])    Cluster headaches    Osteoarthritis    Rico Junker, PT, DPT 01/17/21    2:02 PM    [facility] [address] [place], [place], [address] Phone: [phone]   Fax:  [phone]  Name: Andrew Jones MRN: [id number] Date of Birth: August 27, 1940

## 2021-01-22 ENCOUNTER — Ambulatory Visit: Payer: Medicare Other | Admitting: Neurology

## 2021-02-08 ENCOUNTER — Other Ambulatory Visit: Payer: Self-pay

## 2021-02-08 ENCOUNTER — Ambulatory Visit: Payer: Medicare Other | Attending: Neurology | Admitting: Physical Therapy

## 2021-02-08 ENCOUNTER — Encounter: Payer: Self-pay | Admitting: Physical Therapy

## 2021-02-08 DIAGNOSIS — R42 Dizziness and giddiness: Secondary | ICD-10-CM | POA: Diagnosis not present

## 2021-02-08 DIAGNOSIS — R2689 Other abnormalities of gait and mobility: Secondary | ICD-10-CM | POA: Diagnosis not present

## 2021-02-08 DIAGNOSIS — M6281 Muscle weakness (generalized): Secondary | ICD-10-CM | POA: Diagnosis not present

## 2021-02-08 DIAGNOSIS — R2681 Unsteadiness on feet: Secondary | ICD-10-CM | POA: Diagnosis not present

## 2021-02-08 NOTE — Therapy (Signed)
[facility] [address] [place], [place], [address] Phone: [phone]   Fax:  [phone]  Physical Therapy Treatment  Patient Details  Name: Andrew Jones MRN: [id number] Date of Birth: [birth year] Referring Provider (PT): Garvin Fila, MD   Encounter Date: 02/08/2021   PT End of Session - 02/08/21 1304     Visit Number 2    Number of Visits 17    Date for PT Re-Evaluation 03/18/21    Authorization Type BCBS Medicare; $40 copay    Progress Note Due on Visit 10    PT Start Time 1148    PT Stop Time 7001    PT Time Calculation (min) 47 min    Activity Tolerance Patient tolerated treatment well    Behavior During Therapy [facility] for tasks assessed/performed             Past Medical History:  Diagnosis Date   Ankylosing spondylitis ([place])    back, hips - on embrel 2000-2008, resumed 12/2012   CVA (cerebral vascular accident) ([place]) 1999, 2002   no residual problems   Diabetes mellitus, type 2 ([place])    Dyslipidemia    Hypertension    Osteoarthritis    Seizure disorder ([place]) 05/2009   Sleep apnea    uses c-pap    Past Surgical History:  Procedure Laterality Date   NO PAST SURGERIES      There were no vitals filed for this visit.   Subjective Assessment - 02/08/21 1150     Subjective Pt reports he may be a little bit better (less dizziness) than he was at eval 3 weeks ago; states he has dizziness when he turns his head to the right; Pt not using cane today    Pertinent History Ankylosing spondylitis, CVA in 1999/2001, DM type 2, dyslipidemia, HTN, OA, seizure disorder 2011, sleep apnea    Limitations Walking;Standing    Diagnostic tests NCV: axonal sensorimotor polyneuropathy; MRI in May: No acute findings    Currently in Pain? No/denies                [facility] PT Assessment - 02/08/21 1154       Functional Gait  Assessment   Gait assessed  Yes    Gait Level Surface Walks 20 ft in less than  5.5 sec, no assistive devices, good speed, no evidence for imbalance, normal gait pattern, deviates no more than 6 in outside of the 12 in walkway width.   5.28   Change in Gait Speed Able to change speed, demonstrates mild gait deviations, deviates 6-10 in outside of the 12 in walkway width, or no gait deviations, unable to achieve a major change in velocity, or uses a change in velocity, or uses an assistive device.    Gait with Horizontal Head Turns Performs head turns with moderate changes in gait velocity, slows down, deviates 10-15 in outside 12 in walkway width but recovers, can continue to walk.    Gait with Vertical Head Turns Performs task with moderate change in gait velocity, slows down, deviates 10-15 in outside 12 in walkway width but recovers, can continue to walk.    Gait and Pivot Turn Pivot turns safely in greater than 3 sec and stops with no loss of balance, or pivot turns safely within 3 sec and stops with mild imbalance, requires small steps to catch balance.    Step Over Obstacle Is able to step over one shoe box (4.5 in total height) without  changing gait speed. No evidence of imbalance.    Gait with Narrow Base of Support Ambulates less than 4 steps heel to toe or cannot perform without assistance.    Gait with Eyes Closed Walks 20 ft, uses assistive device, slower speed, mild gait deviations, deviates 6-10 in outside 12 in walkway width. Ambulates 20 ft in less than 9 sec but greater than 7 sec.    Ambulating Backwards Walks 20 ft, uses assistive device, slower speed, mild gait deviations, deviates 6-10 in outside 12 in walkway width.    Steps Alternating feet, must use rail.    Total Score 17                            Vestibular Treatment/Exercise - 02/08/21 0001       Vestibular Treatment/Exercise   Vestibular Treatment Provided Gaze    Gaze Exercises X1 Viewing Horizontal;X1 Viewing Vertical      X1 Viewing Horizontal   Foot Position bil. stance     Time --   30 secs   Reps 1    Comments plain background - 5' away from target; no dizziness reported upon completion of ex.      X1 Viewing Vertical   Foot Position bil. stance    Time --   30 secs   Reps 1    Comments plain background - 5' away from target; no dizziness reported upon completion of exercise            MTSIB - Condition 1 - 30 secs               Condition 2 - 30 secs               Condition 3 - 30 secs               Condition 4 - 30 secs (mild postural instability but no LOB)   Pt performed Lt SLS 2.19 secs:  Rt SLS 1.69 secs   [employer] HEP - [email or web address]  Pt stood on floor (in corner) - EO - performed horizontal head turns 5 reps: vertical head turns 5 reps                                                EC - horizontal and vertical head turns 5 reps each direction   PT Education - 02/08/21 1300     Education Details [employer] HEP -- [email or web address]; also discussed aquatic therapy with pt - recommended by evaluating PT, Misty Stanley; pt will consider participation in this program    Person(s) Educated Patient    Methods Explanation;Demonstration;Handout    Comprehension Verbalized understanding;Returned demonstration              PT Short Term Goals - 02/08/21 1313       PT SHORT TERM GOAL #1   Title Pt will participate in further assessment of balance and falls risk (MCTSIB and FGA)    Baseline completed 02-08-21 ; FGA 17/30:  MCTSIB - all 4 conditions 30 secs    Time 4    Period Weeks    Status Achieved    Target Date 02/16/21      PT SHORT TERM GOAL #2   Title Pt will demonstrate independence with initial vestibular and balance HEP  Time 4    Period Weeks    Status New    Target Date 02/16/21               PT Long Term Goals - 02/08/21 1314       PT LONG TERM GOAL #1   Title Pt will demonstrate independence with final vestibular, balance and LE strength HEP    Time 8    Period Weeks    Status New      PT LONG TERM GOAL #2    Title Pt will demonstrate 4 point improvement in FGA to indicate decreased falls risk to improve safety with walking in community for exercise    Baseline TBD    Time 8    Period Weeks    Status New      PT LONG TERM GOAL #3   Title MCTSIB goal as indicated    Baseline TBD    Time 8    Period Weeks    Status New      PT LONG TERM GOAL #4   Title Pt will demonstrate improved VOR gain as indicated by 2 line difference on DVA    Baseline 3 line difference    Time 8    Period Weeks    Status New      PT LONG TERM GOAL #5   Title Pt will report increase in FOTO to 53 on DFS and >/= 65 on DPS    Time 8    Period Weeks    Status New      PT LONG TERM GOAL #6   Title Pt will demonstrate ability to perform head turns to R in sitting or standing for visual scanning without symptoms of dizziness    Time 8    Period Weeks    Status New                   Plan - 02/08/21 1152     Clinical Impression Statement Pt presents with decreased high level balance and gait deficits with FGA score 17/30 - indicative of fall risk.  Pt also reports dizziness with Rt cervical rotation but dizziness occurred intermittently with Rt cervical rotation in today's session.  Pt performed 5 reps head turns to Rt and did not report dizziness until 4th rep.  Pt reported no dizziness upon completion of gaze stabilization exercise.  Cont with POC.    Personal Factors and Comorbidities Comorbidity 3+;Fitness;Social Background    Comorbidities Ankylosing spondylitis, CVA in 1999/2001, DM type 2, peripheral neuropathy, dyslipidemia, HTN, OA, seizure disorder 2011, sleep apnea    Examination-Activity Limitations Locomotion Level;Stairs    Examination-Participation Restrictions Community Activity;Shop    Stability/Clinical Decision Making Evolving/Moderate complexity    Rehab Potential Good    PT Frequency 2x / week    PT Duration 8 weeks    PT Treatment/Interventions ADLs/Self Care Home Management;Aquatic  Therapy;Canalith Repostioning;DME Instruction;Gait training;Stair training;Functional mobility training;Therapeutic activities;Therapeutic exercise;Balance training;Neuromuscular re-education;Patient/family education;Orthotic Fit/Training;Vestibular    PT Next Visit Plan Check HEP -  I did not give strengthening exercises    PT Home Exercise Plan [place] and Agree with Plan of Care Patient             Patient will benefit from skilled therapeutic intervention in order to improve the following deficits and impairments:  Abnormal gait, Decreased balance, Decreased strength, Difficulty walking, Dizziness, Impaired sensation  Visit Diagnosis: Unsteadiness on feet  Other abnormalities of gait  and mobility  Dizziness and giddiness     Problem List Patient Active Problem List   Diagnosis Date Noted   Obesity 07/03/2020   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   Chronic kidney disease, stage III (moderate) ([place]) 07/03/2020   Low magnesium level 04/29/2019   Seborrheic keratosis 05/15/2018   Hearing loss 12/01/2017   BPH (benign prostatic hypertrophy) 12/20/2014   Ankylosing spondylitis of lumbar region [facility]) 12/20/2014   PSA elevation 12/20/2014   Encounter for therapeutic drug monitoring 12/08/2012   Diabetes mellitus, type 2 ([place])    Seizure disorder ([place])    Hypertension    Hyperlipidemia with target LDL less than 70    CVA (cerebral vascular accident) [facility])    Cluster headaches    Osteoarthritis     Taija Mathias, [place], PT 02/08/2021, 1:16 PM  [facility] [address] [place], [place], [address] Phone: [phone]   Fax:  [phone]  Name: JAIDON ELLERY MRN: [id number] Date of Birth: 01/01/1941

## 2021-02-13 ENCOUNTER — Other Ambulatory Visit: Payer: Self-pay

## 2021-02-13 ENCOUNTER — Ambulatory Visit: Payer: Medicare Other

## 2021-02-13 DIAGNOSIS — R2681 Unsteadiness on feet: Secondary | ICD-10-CM | POA: Diagnosis not present

## 2021-02-13 DIAGNOSIS — M6281 Muscle weakness (generalized): Secondary | ICD-10-CM

## 2021-02-13 DIAGNOSIS — R2689 Other abnormalities of gait and mobility: Secondary | ICD-10-CM | POA: Diagnosis not present

## 2021-02-13 DIAGNOSIS — R42 Dizziness and giddiness: Secondary | ICD-10-CM

## 2021-02-13 NOTE — Therapy (Signed)
[facility] [address] [place], [place], [address] Phone: [phone]   Fax:  [phone]  Physical Therapy Treatment  Patient Details  Name: Andrew Jones MRN: [id number] Date of Birth: 1940/10/14 Referring Provider (PT): Garvin Fila, MD   Encounter Date: 02/13/2021   PT End of Session - 02/13/21 0928     Visit Number 3    Number of Visits 17    Date for PT Re-Evaluation 03/18/21    Authorization Type BCBS Medicare; $40 copay    Progress Note Due on Visit 10    PT Start Time 0930    PT Stop Time 1013    PT Time Calculation (min) 43 min    Activity Tolerance Patient tolerated treatment well    Behavior During Therapy [facility] for tasks assessed/performed             Past Medical History:  Diagnosis Date   Ankylosing spondylitis ([place])    back, hips - on embrel 2000-2008, resumed 12/2012   CVA (cerebral vascular accident) ([place]) 1999, 2002   no residual problems   Diabetes mellitus, type 2 ([place])    Dyslipidemia    Hypertension    Osteoarthritis    Seizure disorder ([place]) 05/2009   Sleep apnea    uses c-pap    Past Surgical History:  Procedure Laterality Date   NO PAST SURGERIES      There were no vitals filed for this visit.   Subjective Assessment - 02/13/21 0930     Subjective Denies new changes since last visit. Reports has had some mild dizziness with the vertical head turns. No other new changes. No falls. No pain. Does not want to complete aquatic therapy.    Pertinent History Ankylosing spondylitis, CVA in 1999/2001, DM type 2, dyslipidemia, HTN, OA, seizure disorder 2011, sleep apnea    Limitations Walking;Standing    Diagnostic tests NCV: axonal sensorimotor polyneuropathy; MRI in May: No acute findings    Currently in Pain? No/denies                Vestibular Treatment/Exercise - 02/13/21 0001       Vestibular Treatment/Exercise   Vestibular Treatment Provided Gaze    Gaze  Exercises X1 Viewing Horizontal;X1 Viewing Vertical      X1 Viewing Horizontal   Foot Position bil. stance    Reps 3    Comments plain background - 5' away from target; x 30 seconds, x 45 secs, x 60 secs      X1 Viewing Vertical   Foot Position bil. stance    Reps 3    Comments plain background - 5' away from target; x 30 seconds, x 45 secs, x 60 secs                Balance Exercises - 02/13/21 0001       Balance Exercises: Standing   Standing Eyes Closed Wide (BOA);Narrow base of support (BOS);Head turns;Solid surface;3 reps;30 secs;Limitations    Standing Eyes Closed Limitations 3 x 30 seconds w/ EC and narrow BOS, wide BOS with EC and horizontal/vertical head turns x 15 reps each direction    Tandem Stance Eyes open;Intermittent upper extremity support;Limitations    Tandem Stance Time completed partial tandem with head turn/nod x 10 reps each, then alteranting foot position. Then completed full tandem with EO 2 x 30 seconds each    SLS Eyes open;Solid surface;Intermittent upper extremity support;2 reps;15 secs;Limitations    SLS Limitations x  2 reps on BLE    SLS with Vectors Solid surface;Intermittent upper extremity assist;Limitations    SLS with Vectors Limitations standing on firm surface completed alteranting toe taps to cones x 10 reps forward, then progressed to crossover toe taps x 15 reps, intermittent challenge requiring CGA and UE support.    Retro Gait Upper extremity support;5 reps;Theraband;Limitations    Theraband Level (Retro Gait) Level 3 (Green)    Retro Gait Limitations x 5 laps down and back at countertop, cues for step length and focus on control.    Sidestepping Upper extremity support;Limitations;Theraband    Theraband Level (Sidestepping) Level 2 (Red);Level 3 (Green)    Sidestepping Limitations completed x 2 laps with red theraband, then progressed to x 3 laps to green theraband. added to HEP    Marching Solid surface;Intermittent upper extremity  assist;Static;Limitations    Marching Limitations completed alternating standing marching with green theraband 2 x 10 reps, cues for control and single UE support required            Access Code: [email or web address] URL: [email or web address] Date: 02/13/2021 Prepared by: Baldomero Lamy   Exercises Standing Gaze Stabilization with Head Rotation - 2 x daily - 7 x weekly - 1 sets - 2 reps Standing Balance in Corner with Eyes Closed - 1 x daily - 7 x weekly - 1 sets - 1 reps Single Leg Stance with Support - 1 x daily - 7 x weekly - 1 sets - 2 reps Side Stepping with Resistance at Thighs and Counter Support - 1 x daily - 7 x weekly - 1 sets - 5 reps Standing Marching - 1 x daily - 7 x weekly - 2 sets - 10 reps Backward Walking with Counter Support - 1 x daily - 7 x weekly - 1 sets - 5 reps     PT Education - 02/13/21 1015     Education Details HEP Update; Progress VOR to 60 secs    Person(s) Educated Patient    Methods Explanation;Demonstration;Handout    Comprehension Verbalized understanding;Returned demonstration              PT Short Term Goals - 02/08/21 1313       PT SHORT TERM GOAL #1   Title Pt will participate in further assessment of balance and falls risk (MCTSIB and FGA)    Baseline completed 02-08-21 ; FGA 17/30:  MCTSIB - all 4 conditions 30 secs    Time 4    Period Weeks    Status Achieved    Target Date 02/16/21      PT SHORT TERM GOAL #2   Title Pt will demonstrate independence with initial vestibular and balance HEP    Time 4    Period Weeks    Status New    Target Date 02/16/21               PT Long Term Goals - 02/08/21 1314       PT LONG TERM GOAL #1   Title Pt will demonstrate independence with final vestibular, balance and LE strength HEP    Time 8    Period Weeks    Status New      PT LONG TERM GOAL #2   Title Pt will demonstrate 4 point improvement in FGA to indicate decreased falls risk to improve safety with walking in  community for exercise    Baseline TBD    Time 8    Period Weeks    Status New  PT LONG TERM GOAL #3   Title MCTSIB goal as indicated    Baseline TBD    Time 8    Period Weeks    Status New      PT LONG TERM GOAL #4   Title Pt will demonstrate improved VOR gain as indicated by 2 line difference on DVA    Baseline 3 line difference    Time 8    Period Weeks    Status New      PT LONG TERM GOAL #5   Title Pt will report increase in FOTO to 53 on DFS and >/= 65 on DPS    Time 8    Period Weeks    Status New      PT LONG TERM GOAL #6   Title Pt will demonstrate ability to perform head turns to R in sitting or standing for visual scanning without symptoms of dizziness    Time 8    Period Weeks    Status New                   Plan - 02/13/21 1108     Clinical Impression Statement Today's skilled PT session included review of HEP established at last visit with patient tolerating well. Continue to have intermittent dizziness, more described as dysequilibrium. Continued rest of session focused on balance activities especially SLS. Initiated LE strengthening exercises and added to HEP. Will conitue per POC.    Personal Factors and Comorbidities Comorbidity 3+;Fitness;Social Background    Comorbidities Ankylosing spondylitis, CVA in 1999/2001, DM type 2, peripheral neuropathy, dyslipidemia, HTN, OA, seizure disorder 2011, sleep apnea    Examination-Activity Limitations Locomotion Level;Stairs    Examination-Participation Restrictions Community Activity;Shop    Stability/Clinical Decision Making Evolving/Moderate complexity    Rehab Potential Good    PT Frequency 2x / week    PT Duration 8 weeks    PT Treatment/Interventions ADLs/Self Care Home Management;Aquatic Therapy;Canalith Repostioning;DME Instruction;Gait training;Stair training;Functional mobility training;Therapeutic activities;Therapeutic exercise;Balance training;Neuromuscular re-education;Patient/family  education;Orthotic Fit/Training;Vestibular    PT Next Visit Plan Check HEP -  I did not give strengthening exercises    PT Home Exercise Plan [place] and Agree with Plan of Care Patient             Patient will benefit from skilled therapeutic intervention in order to improve the following deficits and impairments:  Abnormal gait, Decreased balance, Decreased strength, Difficulty walking, Dizziness, Impaired sensation  Visit Diagnosis: Unsteadiness on feet  Other abnormalities of gait and mobility  Dizziness and giddiness  Muscle weakness (generalized)     Problem List Patient Active Problem List   Diagnosis Date Noted   Obesity 07/03/2020   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   Chronic kidney disease, stage III (moderate) ([facility]) 07/03/2020   Low magnesium level 04/29/2019   Seborrheic keratosis 05/15/2018   Hearing loss 12/01/2017   BPH (benign prostatic hypertrophy) 12/20/2014   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[place]) 12/20/2014   PSA elevation 12/20/2014   Encounter for therapeutic drug monitoring 12/08/2012   Diabetes mellitus, type 2 ([place])    Seizure disorder ([facility])    Hypertension    Hyperlipidemia with target LDL less than 70    CVA (cerebral vascular accident) ([place])    Cluster headaches    Osteoarthritis     Jones Bales, PT, DPT 02/13/2021, 11:11 AM  [facility] [address] [place],  [place], [address] Phone: [phone]   Fax:  [phone]  Name: AGUSTINE ROSSITTO MRN: [id number] Date of Birth: [birth year]

## 2021-02-13 NOTE — Patient Instructions (Signed)
Access Code: [email or web address] URL: [email or web address] Date: 02/13/2021 Prepared by: Baldomero Lamy  Exercises Standing Gaze Stabilization with Head Rotation - 2 x daily - 7 x weekly - 1 sets - 2 reps Standing Balance in Corner with Eyes Closed - 1 x daily - 7 x weekly - 1 sets - 1 reps Single Leg Stance with Support - 1 x daily - 7 x weekly - 1 sets - 2 reps Side Stepping with Resistance at Thighs and Counter Support - 1 x daily - 7 x weekly - 1 sets - 5 reps Standing Marching - 1 x daily - 7 x weekly - 2 sets - 10 reps Backward Walking with Counter Support - 1 x daily - 7 x weekly - 1 sets - 5 reps

## 2021-02-14 ENCOUNTER — Other Ambulatory Visit: Payer: Self-pay | Admitting: Nurse Practitioner

## 2021-02-14 DIAGNOSIS — E559 Vitamin D deficiency, unspecified: Secondary | ICD-10-CM

## 2021-02-20 ENCOUNTER — Ambulatory Visit: Payer: Medicare Other

## 2021-02-21 NOTE — Telephone Encounter (Signed)
Chart supports Rx Medication sent in.

## 2021-02-22 ENCOUNTER — Ambulatory Visit: Payer: Medicare Other | Attending: Nurse Practitioner | Admitting: Physical Therapy

## 2021-02-22 ENCOUNTER — Other Ambulatory Visit: Payer: Self-pay

## 2021-02-22 DIAGNOSIS — R42 Dizziness and giddiness: Secondary | ICD-10-CM | POA: Diagnosis not present

## 2021-02-22 DIAGNOSIS — R2689 Other abnormalities of gait and mobility: Secondary | ICD-10-CM | POA: Diagnosis not present

## 2021-02-22 DIAGNOSIS — R2681 Unsteadiness on feet: Secondary | ICD-10-CM | POA: Diagnosis not present

## 2021-02-22 DIAGNOSIS — M6281 Muscle weakness (generalized): Secondary | ICD-10-CM | POA: Diagnosis not present

## 2021-02-23 ENCOUNTER — Encounter: Payer: Self-pay | Admitting: Physical Therapy

## 2021-02-23 NOTE — Therapy (Signed)
[facility] [address] [place], [place], [address] Phone: [phone]   Fax:  [phone]  Physical Therapy Treatment  Patient Details  Name: Andrew Jones MRN: [id number] Date of Birth: 11/17/1940 Referring Provider (PT): Garvin Fila, MD   Encounter Date: 02/22/2021   PT End of Session - 02/23/21 1659     Visit Number 4    Number of Visits 17    Date for PT Re-Evaluation 03/18/21    Authorization Type BCBS Medicare; $40 copay    Progress Note Due on Visit 10    PT Start Time 1150    PT Stop Time 1233    PT Time Calculation (min) 43 min    Equipment Utilized During Treatment Gait belt    Activity Tolerance Patient tolerated treatment well    Behavior During Therapy [facility] [place] for tasks assessed/performed             Past Medical History:  Diagnosis Date   Ankylosing spondylitis ([place])    back, hips - on embrel 2000-2008, resumed 12/2012   CVA (cerebral vascular accident) ([place]) 1999, 2002   no residual problems   Diabetes mellitus, type 2 ([place])    Dyslipidemia    Hypertension    Osteoarthritis    Seizure disorder ([place]) 05/2009   Sleep apnea    uses c-pap    Past Surgical History:  Procedure Laterality Date   NO PAST SURGERIES      There were no vitals filed for this visit.   Subjective Assessment - 02/22/21 1151     Subjective Pt states he thinks the dizziness is improving some - still gets dizzy if he turns his head to the right - but is able to turn head during driving but dizziness not as quick as an onset and "not as dizzy" as he used to get when he looked to the right    Pertinent History Ankylosing spondylitis, CVA in 1999/2001, DM type 2, dyslipidemia, HTN, OA, seizure disorder 2011, sleep apnea    Limitations Walking;Standing    Diagnostic tests NCV: axonal sensorimotor polyneuropathy; MRI in May: No acute findings    Currently in Pain? No/denies                                [facility] Adult PT Treatment/Exercise - 02/23/21 0001       Transfers   Transfers Sit to Stand;Stand to Sit    Sit to Stand 5: Supervision    Stand to Sit 5: Supervision    Number of Reps 10 reps   5 reps feet on floor; 5 reps feet on Airex - EO; no UE support                Balance Exercises - 02/23/21 0001       Balance Exercises: Standing   Standing Eyes Opened Narrow base of support (BOS);Wide (BOA);Head turns;Foam/compliant surface;5 reps   horizontal and vertical head turns - standing on 2 pillows in corner; 5 reps each direction   Standing Eyes Closed Wide (BOA);Head turns;Foam/compliant surface;5 reps   horizontal & vertical head turns - on 2 pillows in corner with UE support on chair in front with CGA   Rockerboard Anterior/posterior;EO;EC;Other reps (comment)   10 reps EO and 10 reps EC with UE support prn   Marching Solid surface;Foam/compliant surface;Head turns;Static   pt performed marching on floor 10 reps each leg with  EO; marching on AIrex 10 reps with CGA to min assist with EC (no head turns) with UE support on // bars prn   Other Standing Exercises Pt stood by mat table (did not touch table with legs)- performed head turn to Rt side - 5 sec hold on 1st rep which provoked moderate dizziness; the performed 3 reps with a 3 sec hold with pt reporting less dizziness provoked -    Other Standing Exercises Comments Pt performed balance ex. inside // bars - standing on inverted BOSU - with bil. UE support; performed anterior/posterior weight shifts 10 reps and then lateral weight shifts 10 reps with bil. UE support.  Pt then performed SLS activity on each leg - one foot in middle - pt slowly moved other leg forward/back and then laterally 5 reps each direction each leg with UE support  with CGA                  PT Short Term Goals - 02/23/21 1703       PT SHORT TERM GOAL #1   Title Pt will participate in further assessment of  balance and falls risk (MCTSIB and FGA)    Baseline completed 02-08-21 ; FGA 17/30:  MCTSIB - all 4 conditions 30 secs    Time 4    Period Weeks    Status Achieved    Target Date 02/16/21      PT SHORT TERM GOAL #2   Title Pt will demonstrate independence with initial vestibular and balance HEP    Time 4    Period Weeks    Status New    Target Date 02/16/21               PT Long Term Goals - 02/22/21 1215       PT LONG TERM GOAL #1   Title Pt will demonstrate independence with final vestibular, balance and LE strength HEP    Time 8    Period Weeks    Status New      PT LONG TERM GOAL #2   Title Pt will demonstrate 4 point improvement in FGA to indicate decreased falls risk to improve safety with walking in community for exercise    Baseline TBD    Time 8    Period Weeks    Status New      PT LONG TERM GOAL #3   Title MCTSIB goal as indicated    Baseline TBD    Time 8    Period Weeks    Status New      PT LONG TERM GOAL #4   Title Pt will demonstrate improved VOR gain as indicated by 2 line difference on DVA    Baseline 3 line difference    Time 8    Period Weeks    Status New      PT LONG TERM GOAL #5   Title Pt will report increase in FOTO to 53 on DFS and >/= 65 on DPS    Time 8    Period Weeks    Status New      PT LONG TERM GOAL #6   Title Pt will demonstrate ability to perform head turns to R in sitting or standing for visual scanning without symptoms of dizziness    Time 8    Period Weeks    Status New                   Plan - 02/22/21  1150     Clinical Impression Statement Pt continues to c/o dizziness with Rt cervical rotation with sustained position of approx. 5 secs; pt did report less dizziness provoked with position held for approx. 3 secs.  Pt continues to have unsteadiness and LOB with standing on compliant surfaces with EC, indicative of decr. vestibular input in maintaining balance.  Cont with POC.    Personal Factors and  Comorbidities Comorbidity 3+;Fitness;Social Background    Comorbidities Ankylosing spondylitis, CVA in 1999/2001, DM type 2, peripheral neuropathy, dyslipidemia, HTN, OA, seizure disorder 2011, sleep apnea    Examination-Activity Limitations Locomotion Level;Stairs    Examination-Participation Restrictions Community Activity;Shop    Stability/Clinical Decision Making Evolving/Moderate complexity    Rehab Potential Good    PT Frequency 2x / week    PT Duration 8 weeks    PT Treatment/Interventions ADLs/Self Care Home Management;Aquatic Therapy;Canalith Repostioning;DME Instruction;Gait training;Stair training;Functional mobility training;Therapeutic activities;Therapeutic exercise;Balance training;Neuromuscular re-education;Patient/family education;Orthotic Fit/Training;Vestibular    PT Next Visit Plan cont balance/vestibular exs.  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   Chronic kidney disease, stage III (moderate) ([place]) 07/03/2020   Low magnesium level 04/29/2019   Seborrheic keratosis 05/15/2018   Hearing loss 12/01/2017   BPH (benign prostatic hypertrophy) 12/20/2014   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[place]) 12/20/2014   PSA elevation 12/20/2014   Encounter for therapeutic drug monitoring 12/08/2012   Diabetes mellitus, type 2 ([place])    Seizure disorder ([place])    Hypertension    Hyperlipidemia with  target LDL less than 70    CVA (cerebral vascular accident) [facility])    Cluster headaches    Osteoarthritis     Morgaine Kimball, [place], PT 02/23/2021, 5:05 PM  [facility] [address] [place], [place], [address] Phone: [phone]   Fax:  [phone]  Name: JARMAN LITTON MRN: [id number] Date of Birth: 03-31-1940

## 2021-02-27 ENCOUNTER — Ambulatory Visit: Payer: Medicare Other

## 2021-02-27 ENCOUNTER — Other Ambulatory Visit: Payer: Self-pay

## 2021-02-27 DIAGNOSIS — R42 Dizziness and giddiness: Secondary | ICD-10-CM

## 2021-02-27 DIAGNOSIS — R2689 Other abnormalities of gait and mobility: Secondary | ICD-10-CM | POA: Diagnosis not present

## 2021-02-27 DIAGNOSIS — M6281 Muscle weakness (generalized): Secondary | ICD-10-CM

## 2021-02-27 DIAGNOSIS — R2681 Unsteadiness on feet: Secondary | ICD-10-CM | POA: Diagnosis not present

## 2021-02-27 NOTE — Patient Instructions (Signed)
Access Code: [email or web address] URL: [email or web address] Date: 02/27/2021 Prepared by: Baldomero Lamy  Exercises Standing Gaze Stabilization with Head Rotation - 2 x daily - 7 x weekly - 1 sets - 2 reps Standing Balance in Corner with Eyes Closed - 1 x daily - 7 x weekly - 1 sets - 1 reps Single Leg Stance with Support - 1 x daily - 7 x weekly - 1 sets - 2 reps Side Stepping with Resistance at Thighs and Counter Support - 1 x daily - 7 x weekly - 1 sets - 5 reps Standing Marching - 1 x daily - 7 x weekly - 2 sets - 10 reps Backward Walking with Counter Support - 1 x daily - 7 x weekly - 1 sets - 5 reps Heel Toe Raises with Counter Support - 1 x daily - 7 x weekly - 2 sets - 10 reps

## 2021-02-27 NOTE — Therapy (Signed)
[facility] [address] [place], [place], [address] Phone: [phone]   Fax:  [phone]  Physical Therapy Treatment  Patient Details  Name: Andrew Jones MRN: [id number] Date of Birth: 17-Jul-1940 Referring Provider (PT): Garvin Fila, MD   Encounter Date: 02/27/2021   PT End of Session - 02/27/21 1243     Visit Number 5    Number of Visits 17    Date for PT Re-Evaluation 03/18/21    Authorization Type BCBS Medicare; $40 copay    Progress Note Due on Visit 10    PT Start Time 1146    PT Stop Time 1229    PT Time Calculation (min) 43 min    Equipment Utilized During Treatment Gait belt    Activity Tolerance Patient tolerated treatment well    Behavior During Therapy [facility] for tasks assessed/performed             Past Medical History:  Diagnosis Date   Ankylosing spondylitis ([place])    back, hips - on embrel 2000-2008, resumed 12/2012   CVA (cerebral vascular accident) ([place]) 1999, 2002   no residual problems   Diabetes mellitus, type 2 ([place])    Dyslipidemia    Hypertension    Osteoarthritis    Seizure disorder ([place]) 05/2009   Sleep apnea    uses c-pap    Past Surgical History:  Procedure Laterality Date   NO PAST SURGERIES      There were no vitals filed for this visit.   Subjective Assessment - 02/27/21 1148     Subjective Pt states his dizziness has been feeling about the same and possibly a bit better. Pt states he can tolerate a little more before his dizziniess comes on. Pt states his strength in his legs is fluctuating.    Pertinent History Ankylosing spondylitis, CVA in 1999/2001, DM type 2, dyslipidemia, HTN, OA, seizure disorder 2011, sleep apnea    Limitations Walking;Standing    Diagnostic tests NCV: axonal sensorimotor polyneuropathy; MRI in May: No acute findings    Currently in Pain? No/denies                                Vestibular Treatment/Exercise -  02/27/21 0001       Vestibular Treatment/Exercise   Vestibular Treatment Provided Gaze    Gaze Exercises X1 Viewing Horizontal;X1 Viewing Vertical      X1 Viewing Horizontal   Foot Position bil. stance    Reps 3    Comments plain background - 5' away from target; 45 secs, while standing on airex      X1 Viewing Vertical   Foot Position bil. stance    Reps 3    Comments plain background - 5' away from target; 45 secs, while standing on airex                Balance Exercises - 02/27/21 0001       Balance Exercises: Standing   Standing Eyes Opened --    Standing Eyes Closed Wide (BOA);Foam/compliant surface;3 reps;30 secs    Standing Eyes Closed Limitations 3 x 30 seconds w/ EC and narrow BOS, wide BOS with EC and horizontal/vertical head turns 2x10 reps each direction. Pt demonstrated increased sway and required intermittent UE tocuh to maintain balance. PT provided verbal cues to slow movements down while performing HT.    SLS with Vectors Foam/compliant surface;Intermittent upper extremity assist  SLS with Vectors Limitations Standing on airex pt performed 2x10 BLE each of cone taps straight ahead. Pt required intermittent UE assist and occasional CGA for safety. PT provided verbal cues to move slow and controlled. Pt performed 2x10 of crossed cone taps BLE each. Pt required intermittent UE assist and PT CGA for safety. Pt's balance improved once cued to go slow and controlled.    Rockerboard Anterior/posterior;30 seconds;Intermittent UE support;Limitations    Rockerboard Limitations Pt performed 3x30" of rockerboard in // EO/EC each. Pt performed activity while looking at mirror to assist when finding balance point on rockerboard. Pt required intermittent UE assist w/ EC as pt had increased sway. PT provided min guard and occasional CGA for safety.    Other Standing Exercises Comments In // pt performed 3x10 of heel/toe raises. Pt required 2 finger BUE assist for balance. Pt's  balance w/ activity when verbally cued by PT to go slower and more controlled. Pt had increased difficutly w/ raising toes as opposed to raising heels.                PT Education - 02/27/21 1246     Education Details HEP updated w/ heel/toe raisses    Person(s) Educated Patient    Methods Explanation;Demonstration;Handout    Comprehension Returned demonstration;Verbalized understanding              PT Short Term Goals - 02/23/21 1703       PT SHORT TERM GOAL #1   Title Pt will participate in further assessment of balance and falls risk (MCTSIB and FGA)    Baseline completed 02-08-21 ; FGA 17/30:  MCTSIB - all 4 conditions 30 secs    Time 4    Period Weeks    Status Achieved    Target Date 02/16/21      PT SHORT TERM GOAL #2   Title Pt will demonstrate independence with initial vestibular and balance HEP    Time 4    Period Weeks    Status New    Target Date 02/16/21               PT Long Term Goals - 02/22/21 1215       PT LONG TERM GOAL #1   Title Pt will demonstrate independence with final vestibular, balance and LE strength HEP    Time 8    Period Weeks    Status New      PT LONG TERM GOAL #2   Title Pt will demonstrate 4 point improvement in FGA to indicate decreased falls risk to improve safety with walking in community for exercise    Baseline TBD    Time 8    Period Weeks    Status New      PT LONG TERM GOAL #3   Title MCTSIB goal as indicated    Baseline TBD    Time 8    Period Weeks    Status New      PT LONG TERM GOAL #4   Title Pt will demonstrate improved VOR gain as indicated by 2 line difference on DVA    Baseline 3 line difference    Time 8    Period Weeks    Status New      PT LONG TERM GOAL #5   Title Pt will report increase in FOTO to 53 on DFS and >/= 65 on DPS    Time 8    Period Weeks    Status New  PT LONG TERM GOAL #6   Title Pt will demonstrate ability to perform head turns to R in sitting or standing  for visual scanning without symptoms of dizziness    Time 8    Period Weeks    Status New                   Plan - 02/27/21 1244     Clinical Impression Statement Today's session focused on continued improvements in balance and VOR. Pt was progressed to VOR on compliant surface w/ wide base of support. Pt still has deficits in balance while on complaint surfaces specifically w/ EC and w/ narrow base of support. PT will continue to progress pt as tolerated w/ POC.    Personal Factors and Comorbidities Comorbidity 3+;Fitness;Social Background    Comorbidities Ankylosing spondylitis, CVA in 1999/2001, DM type 2, peripheral neuropathy, dyslipidemia, HTN, OA, seizure disorder 2011, sleep apnea    Examination-Activity Limitations Locomotion Level;Stairs    Examination-Participation Restrictions Community Activity;Shop    Stability/Clinical Decision Making Evolving/Moderate complexity    Rehab Potential Good    PT Frequency 2x / week    PT Duration 8 weeks    PT Treatment/Interventions ADLs/Self Care Home Management;Aquatic Therapy;Canalith Repostioning;DME Instruction;Gait training;Stair training;Functional mobility training;Therapeutic activities;Therapeutic exercise;Balance training;Neuromuscular re-education;Patient/family education;Orthotic Fit/Training;Vestibular    PT Next Visit Plan cont balance/vestibular exs.  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   Chronic kidney disease, stage III (moderate) ([facility]) 07/03/2020   Low magnesium level 04/29/2019   Seborrheic keratosis 05/15/2018   Hearing loss 12/01/2017   BPH (benign prostatic hypertrophy) 12/20/2014   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[place]) 12/20/2014   PSA elevation 12/20/2014   Encounter for therapeutic drug monitoring 12/08/2012   Diabetes mellitus, type 2 ([place])    Seizure disorder ([place])    Hypertension    Hyperlipidemia with target LDL less than 70    CVA (cerebral vascular accident) [facility])    Cluster headaches    Osteoarthritis     Lottie Mussel, Student-PT 02/27/2021, 12:47 PM  [facility] [address] [place], [place], [address] Phone: [phone]   Fax:  [phone]  Name: IVOR KISHI MRN: [id number] Date of Birth: [birth year]

## 2021-03-01 ENCOUNTER — Other Ambulatory Visit: Payer: Self-pay

## 2021-03-01 ENCOUNTER — Ambulatory Visit: Payer: Medicare Other | Admitting: Physical Therapy

## 2021-03-01 DIAGNOSIS — R2681 Unsteadiness on feet: Secondary | ICD-10-CM | POA: Diagnosis not present

## 2021-03-01 DIAGNOSIS — R2689 Other abnormalities of gait and mobility: Secondary | ICD-10-CM | POA: Diagnosis not present

## 2021-03-01 DIAGNOSIS — M6281 Muscle weakness (generalized): Secondary | ICD-10-CM | POA: Diagnosis not present

## 2021-03-01 DIAGNOSIS — R42 Dizziness and giddiness: Secondary | ICD-10-CM

## 2021-03-02 NOTE — Therapy (Signed)
[facility] [address] [place], [place], [address] Phone: [phone]   Fax:  [phone]  Physical Therapy Treatment  Patient Details  Name: Andrew Jones MRN: [id number] Date of Birth: 1941/02/14 Referring Provider (PT): Garvin Fila, MD   Encounter Date: 03/01/2021   PT End of Session - 03/02/21 1019     Visit Number 6    Number of Visits 17    Date for PT Re-Evaluation 03/18/21    Authorization Type BCBS Medicare; $40 copay    Progress Note Due on Visit 10    PT Start Time 1101    PT Stop Time 1145    PT Time Calculation (min) 44 min    Equipment Utilized During Treatment Gait belt    Activity Tolerance Patient tolerated treatment well    Behavior During Therapy [facility] for tasks assessed/performed             Past Medical History:  Diagnosis Date   Ankylosing spondylitis ([place])    back, hips - on embrel 2000-2008, resumed 12/2012   CVA (cerebral vascular accident) ([place]) 1999, 2002   no residual problems   Diabetes mellitus, type 2 ([place])    Dyslipidemia    Hypertension    Osteoarthritis    Seizure disorder ([place]) 05/2009   Sleep apnea    uses c-pap    Past Surgical History:  Procedure Laterality Date   NO PAST SURGERIES      There were no vitals filed for this visit.   Subjective Assessment - 03/01/21 1105     Subjective Pt states he is better - able to hold head in Rt rotation longer now without getting as dizzy - less intense and takes longer to occur    Pertinent History Ankylosing spondylitis, CVA in 1999/2001, DM type 2, dyslipidemia, HTN, OA, seizure disorder 2011, sleep apnea    Limitations Walking;Standing    Diagnostic tests NCV: axonal sensorimotor polyneuropathy; MRI in May: No acute findings    Currently in Pain? No/denies                               [facility] Adult PT Treatment/Exercise - 03/02/21 0001       High Level Balance   High Level Balance  Activities Sudden stops;Head turns;Tandem walking;Marching forwards                 Balance Exercises - 03/02/21 0001       Balance Exercises: Standing   Standing Eyes Opened Narrow base of support (BOS);Wide (BOA);Head turns;Foam/compliant surface;5 reps   horizontal and vertical head turns - standing on 2 pillows in corner; 5 reps each direction   Standing Eyes Closed Wide (BOA);Head turns;Foam/compliant surface;5 reps   horizontal & vertical head turns - on 2 pillows in corner with UE support on chair in front with CGA   Rockerboard Anterior/posterior;EO;EC;Other reps (comment)   10 reps EO and 20  reps EC with UE support prn   Rockerboard Limitations pt needed intermittent UE support on bars on treadmill to assist with balance recovery - in addition to min assist from PT with EC;    Retro Gait 2 reps   backwards amb. on floor - no head turns   Other Standing Exercises Pt performed static standing on rockerboard - with UE support prn - performed horizontal and vertical head turns 5 reps each direction    Other Standing Exercises Comments  Pt performed stepping down to floor from Airex in corner - 5 reps each leg on 1st activity without head turns; added head turn to one side only simultaneously with stepping down to floor 5 reps each leg with mod assist for recovery of LOB                  PT Short Term Goals - 03/02/21 1023       PT SHORT TERM GOAL #1   Title Pt will participate in further assessment of balance and falls risk (MCTSIB and FGA)    Baseline completed 02-08-21 ; FGA 17/30:  MCTSIB - all 4 conditions 30 secs    Time 4    Period Weeks    Status Achieved    Target Date 02/16/21      PT SHORT TERM GOAL #2   Title Pt will demonstrate independence with initial vestibular and balance HEP    Time 4    Period Weeks    Status New    Target Date 02/16/21               PT Long Term Goals - 03/02/21 1023       PT LONG TERM GOAL #1   Title Pt will  demonstrate independence with final vestibular, balance and LE strength HEP    Time 8    Period Weeks    Status New      PT LONG TERM GOAL #2   Title Pt will demonstrate 4 point improvement in FGA to indicate decreased falls risk to improve safety with walking in community for exercise    Baseline TBD    Time 8    Period Weeks    Status New      PT LONG TERM GOAL #3   Title MCTSIB goal as indicated    Baseline TBD    Time 8    Period Weeks    Status New      PT LONG TERM GOAL #4   Title Pt will demonstrate improved VOR gain as indicated by 2 line difference on DVA    Baseline 3 line difference    Time 8    Period Weeks    Status New      PT LONG TERM GOAL #5   Title Pt will report increase in FOTO to 53 on DFS and >/= 65 on DPS    Time 8    Period Weeks    Status New      PT LONG TERM GOAL #6   Title Pt will demonstrate ability to perform head turns to R in sitting or standing for visual scanning without symptoms of dizziness    Time 8    Period Weeks    Status New                   Plan - 03/02/21 1020     Clinical Impression Statement Pt demonstrating improvement in dizziness when head is turned to the Rt side; pt able to hold head in Rt cervical rotation for 20 secs prior to onset of dizziness.  Pt continues to have difficulty maintaining balance with standing on compliant surfaces with EO with head turns and also with EC; neuropathy in feet contributes to decreased balance on compliant surfaces as well as decreased vestibular input.  Cont with POC.    Personal Factors and Comorbidities Comorbidity 3+;Fitness;Social Background    Comorbidities Ankylosing spondylitis, CVA in 1999/2001, DM type 2, peripheral neuropathy,  dyslipidemia, HTN, OA, seizure disorder 2011, sleep apnea    Examination-Activity Limitations Locomotion Level;Stairs    Examination-Participation Restrictions Community Activity;Shop    Stability/Clinical Decision Making Evolving/Moderate  complexity    Rehab Potential Good    PT Frequency 2x / week    PT Duration 8 weeks    PT Treatment/Interventions ADLs/Self Care Home Management;Aquatic Therapy;Canalith Repostioning;DME Instruction;Gait training;Stair training;Functional mobility training;Therapeutic activities;Therapeutic exercise;Balance training;Neuromuscular re-education;Patient/family education;Orthotic Fit/Training;Vestibular    PT Next Visit Plan cont balance/vestibular exs.  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   Chronic kidney disease, stage III (moderate) ([place]) 07/03/2020   Low magnesium level 04/29/2019   Seborrheic keratosis 05/15/2018   Hearing loss 12/01/2017   BPH (benign prostatic hypertrophy) 12/20/2014   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[place]) 12/20/2014   PSA elevation 12/20/2014   Encounter for therapeutic drug monitoring 12/08/2012   Diabetes mellitus, type 2 ([place])    Seizure disorder ([place])    Hypertension    Hyperlipidemia with target LDL less than 70    CVA (cerebral vascular accident) [facility])    Cluster headaches    Osteoarthritis     Mossie Gilder, [place], PT 03/02/2021, 10:25 AM  [facility] [address] [place], [place], [address] Phone: [phone]   Fax:  [phone]  Name: Andrew Jones MRN: [id number] Date of Birth: Jun 18, 1940

## 2021-03-06 ENCOUNTER — Ambulatory Visit: Payer: Medicare Other | Admitting: Physical Therapy

## 2021-03-06 ENCOUNTER — Other Ambulatory Visit: Payer: Self-pay

## 2021-03-06 DIAGNOSIS — M6281 Muscle weakness (generalized): Secondary | ICD-10-CM | POA: Diagnosis not present

## 2021-03-06 DIAGNOSIS — R42 Dizziness and giddiness: Secondary | ICD-10-CM | POA: Diagnosis not present

## 2021-03-06 DIAGNOSIS — R2689 Other abnormalities of gait and mobility: Secondary | ICD-10-CM | POA: Diagnosis not present

## 2021-03-06 DIAGNOSIS — R2681 Unsteadiness on feet: Secondary | ICD-10-CM | POA: Diagnosis not present

## 2021-03-07 NOTE — Therapy (Signed)
[facility] [address] [place], [place], [address] Phone: [phone]   Fax:  [phone]  Physical Therapy Treatment  Patient Details  Name: TESLA BOCHICCHIO MRN: [id number] Date of Birth: [birth year] Referring Provider (PT): Garvin Fila, MD   Encounter Date: 03/06/2021   PT End of Session - 03/07/21 1531     Visit Number 7    Number of Visits 17    Date for PT Re-Evaluation 03/18/21    Authorization Type BCBS Medicare; $40 copay    Progress Note Due on Visit 10    PT Start Time 1147    PT Stop Time 1231    PT Time Calculation (min) 44 min    Equipment Utilized During Treatment Gait belt    Activity Tolerance Patient tolerated treatment well    Behavior During Therapy [facility] for tasks assessed/performed             Past Medical History:  Diagnosis Date   Ankylosing spondylitis ([place])    back, hips - on embrel 2000-2008, resumed 12/2012   CVA (cerebral vascular accident) ([place]) 1999, 2002   no residual problems   Diabetes mellitus, type 2 ([place])    Dyslipidemia    Hypertension    Osteoarthritis    Seizure disorder ([place]) 05/2009   Sleep apnea    uses c-pap    Past Surgical History:  Procedure Laterality Date   NO PAST SURGERIES      There were no vitals filed for this visit.   Subjective Assessment - 03/06/21 1145     Subjective Pt states the rotation of head to the right is good - less dizziness provoked and slower to be provoked in this position    Pertinent History Ankylosing spondylitis, CVA in 1999/2001, DM type 2, dyslipidemia, HTN, OA, seizure disorder 2011, sleep apnea    Limitations Walking;Standing    Diagnostic tests NCV: axonal sensorimotor polyneuropathy; MRI in May: No acute findings    Currently in Pain? No/denies                               [facility] Adult PT Treatment/Exercise - 03/07/21 0001       Transfers   Transfers Sit to Stand;Stand to Sit    Sit to  Stand 5: Supervision    Stand to Sit 5: Supervision    Number of Reps Other reps (comment)   5 reps   Comments from standard chair with bil. feet on Airex - pt performed 5 reps with EO without UE support from chair      Exercises   Exercises Knee/Hip      Knee/Hip Exercises: Standing   Heel Raises Both;1 set;10 reps   3 sec hold with UE support on hand rails   Forward Step Up Right;Left;1 set;10 reps;Hand Hold: 2;Step Height: 6"                 Balance Exercises - 03/07/21 0001       Balance Exercises: Standing   Standing Eyes Opened Narrow base of support (BOS);Head turns;Foam/compliant surface;5 reps   on 2 pillows in corner; horizontal and vertical head turns   Standing Eyes Closed Wide (BOA);Head turns;Foam/compliant surface;5 reps   horizontal & vertical head turns - on 2 pillows in corner with UE support on chair in front with CGA   Rockerboard Anterior/posterior;EO;EC;Other reps (comment)   10 reps EO and 20  reps  EC with UE support prn; also standing statically on board with UE support prn - with horizontal and vertical head turns 5 reps each   Other Standing Exercises Pt performed stepping down to floor from rockerboard with UE support prn - 5 reps each leg forward, then 5 reps each leg backward    Other Standing Exercises Comments Pt performed stepping down to floor from Airex in corner - 5 reps each leg on 1st activity without head turns; added head turn to one side only simultaneously with stepping down to floor 5 reps each leg with mod assist for recovery of LOB            Pt performed standing on inverted Bosu inside // bars with UE support prn - pt performed anterior/posterior weight shifts 10 reps with UE support; lateral weight shifts 10 reps with UE support  Pt performed SLS activity on Bosu - one foot in middle, slowly moving other leg forward/back and laterally 10 reps each leg  With UE support on // bars   Marching on Airex with EO and then with EC -  holding onto bars prn - 10 reps each; added head turns slowly 5 reps horizontally And 5 reps vertically with 2 finger UE support on each bar    PT Education - 03/07/21 1529     Education Details discussed D/C plan to finish next week as cert. date ends 03-18-21 - pt agreed with cancelling the 2 PT appts on 12-27 and 03-22-21    Person(s) Educated Patient    Methods Explanation    Comprehension Verbalized understanding              PT Short Term Goals - 03/07/21 1535       PT SHORT TERM GOAL #1   Title Pt will participate in further assessment of balance and falls risk (MCTSIB and FGA)    Baseline completed 02-08-21 ; FGA 17/30:  MCTSIB - all 4 conditions 30 secs    Time 4    Period Weeks    Status Achieved    Target Date 02/16/21      PT SHORT TERM GOAL #2   Title Pt will demonstrate independence with initial vestibular and balance HEP    Time 4    Period Weeks    Status New    Target Date 02/16/21               PT Long Term Goals - 03/06/21 1146       PT LONG TERM GOAL #1   Title Pt will demonstrate independence with final vestibular, balance and LE strength HEP    Time 8    Period Weeks    Status New      PT LONG TERM GOAL #2   Title Pt will demonstrate 4 point improvement in FGA to indicate decreased falls risk to improve safety with walking in community for exercise    Baseline TBD    Time 8    Period Weeks    Status New      PT LONG TERM GOAL #3   Title MCTSIB goal as indicated    Baseline TBD    Time 8    Period Weeks    Status New      PT LONG TERM GOAL #4   Title Pt will demonstrate improved VOR gain as indicated by 2 line difference on DVA    Baseline 3 line difference    Time 8  Period Weeks    Status New      PT LONG TERM GOAL #5   Title Pt will report increase in FOTO to 53 on DFS and >/= 65 on DPS    Time 8    Period Weeks    Status New      PT LONG TERM GOAL #6   Title Pt will demonstrate ability to perform head turns to R  in sitting or standing for visual scanning without symptoms of dizziness    Time 8    Period Weeks    Status New                   Plan - 03/07/21 1532     Clinical Impression Statement Skilled PT session focused on dynamic standing balance activities with increase vestibular input required and also on LE strengthening.  Pt continues to have dizziness with Rt cervical rotation past approx. 45 degrees after approx. 15 secs,  Pt has most difficulty maintaining balance with EC with standing on compliant surfaces due to vestibular hypofunction and also due to neuropathy in bil. feet.  Cont with POC.    Personal Factors and Comorbidities Comorbidity 3+;Fitness;Social Background    Comorbidities Ankylosing spondylitis, CVA in 1999/2001, DM type 2, peripheral neuropathy, dyslipidemia, HTN, OA, seizure disorder 2011, sleep apnea    Examination-Activity Limitations Locomotion Level;Stairs    Examination-Participation Restrictions Community Activity;Shop    Stability/Clinical Decision Making Evolving/Moderate complexity    Rehab Potential Good    PT Frequency 2x / week    PT Duration 8 weeks    PT Treatment/Interventions ADLs/Self Care Home Management;Aquatic Therapy;Canalith Repostioning;DME Instruction;Gait training;Stair training;Functional mobility training;Therapeutic activities;Therapeutic exercise;Balance training;Neuromuscular re-education;Patient/family education;Orthotic Fit/Training;Vestibular    PT Next Visit Plan cont balance/vestibular exs.  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   Chronic kidney disease, stage III (moderate) ([facility]) 07/03/2020   Low magnesium level 04/29/2019   Seborrheic keratosis 05/15/2018   Hearing loss 12/01/2017   BPH (benign prostatic hypertrophy) 12/20/2014   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[place]) 12/20/2014   PSA elevation 12/20/2014   Encounter for therapeutic drug monitoring 12/08/2012   Diabetes mellitus, type 2 ([place])    Seizure disorder ([place])    Hypertension    Hyperlipidemia with target LDL less than 70    CVA (cerebral vascular accident) [facility])    Cluster headaches    Osteoarthritis     Cianni Manny, [place], PT 03/07/2021, 3:37 PM  [facility] [address] [place], [place], [address] Phone: [phone]   Fax:  [phone]  Name: KAMERYN TISDEL MRN: [id number] Date of Birth: January 03, 1941

## 2021-03-08 ENCOUNTER — Other Ambulatory Visit: Payer: Self-pay

## 2021-03-08 ENCOUNTER — Ambulatory Visit: Payer: Medicare Other

## 2021-03-08 DIAGNOSIS — R2689 Other abnormalities of gait and mobility: Secondary | ICD-10-CM | POA: Diagnosis not present

## 2021-03-08 DIAGNOSIS — R42 Dizziness and giddiness: Secondary | ICD-10-CM

## 2021-03-08 DIAGNOSIS — R2681 Unsteadiness on feet: Secondary | ICD-10-CM | POA: Diagnosis not present

## 2021-03-08 DIAGNOSIS — M6281 Muscle weakness (generalized): Secondary | ICD-10-CM | POA: Diagnosis not present

## 2021-03-08 NOTE — Therapy (Signed)
[facility] [address] [place], [place], [address] Phone: [phone]   Fax:  [phone]  Physical Therapy Treatment  Patient Details  Name: Andrew Jones MRN: [id number] Date of Birth: 1940/05/25 Referring Provider (PT): Garvin Fila, MD   Encounter Date: 03/08/2021   PT End of Session - 03/08/21 1149     Visit Number 8    Number of Visits 17    Date for PT Re-Evaluation 03/18/21    Authorization Type BCBS Medicare; $40 copay    Progress Note Due on Visit 10    PT Start Time 1147    PT Stop Time 1229    PT Time Calculation (min) 42 min    Equipment Utilized During Treatment Gait belt    Activity Tolerance Patient tolerated treatment well    Behavior During Therapy [facility] for tasks assessed/performed             Past Medical History:  Diagnosis Date   Ankylosing spondylitis ([place])    back, hips - on embrel 2000-2008, resumed 12/2012   CVA (cerebral vascular accident) ([place]) 1999, 2002   no residual problems   Diabetes mellitus, type 2 ([place])    Dyslipidemia    Hypertension    Osteoarthritis    Seizure disorder ([place]) 05/2009   Sleep apnea    uses c-pap    Past Surgical History:  Procedure Laterality Date   NO PAST SURGERIES      There were no vitals filed for this visit.   Subjective Assessment - 03/08/21 1149     Subjective No new changes/complaints.    Pertinent History Ankylosing spondylitis, CVA in 1999/2001, DM type 2, dyslipidemia, HTN, OA, seizure disorder 2011, sleep apnea    Limitations Walking;Standing    Diagnostic tests NCV: axonal sensorimotor polyneuropathy; MRI in May: No acute findings    Currently in Pain? No/denies                [facility] Adult PT Treatment/Exercise - 03/08/21 0001       Exercises   Exercises Knee/Hip      Knee/Hip Exercises: Aerobic   Nustep L5 x 5 minutes with BLE/BUE to work on improve strengthening/endurance. PT educating on potetnial options  to continue this upon d/c including gym membership or stepper for home use. PT showed patietn options for purchase for home use.                 Balance Exercises - 03/08/21 0001       Balance Exercises: Standing   Standing Eyes Closed Wide (BOA);Foam/compliant surface;3 reps;30 secs   on airex, slow progression with more narrow BOS   Gait with Head Turns Forward;2 reps;Limitations    Gait with Head Turns Limitations completed gait forwards with head turns/nods, 2 x 15'. cues for pace.    Retro Gait 4 reps    Retro Gait Limitations completed retro walking without UE support, working on equal step and maintaining balance. CGA    Step Over Hurdles / Cones worked on forward ambulation stepping over black beams x 4 laps. then completed step over cones with alteranting toe tap to cone prior to stepover to promote SLS x 4 laps. intermittent cues to avoid circumduction and improved hip/knee flexion.    Marching Foam/compliant surface;Intermittent upper extremity assist;Head turns;Static;Limitations    Marching Limitations static marching with EO x 30 seconds, then progressed to addition of horizontal/vertical head turns x 10 reps each.    Other Standing  Exercises completed gait with eyes closed 4 x 25', intermittent touch A to wall and CGA                PT Education - 03/08/21 1317     Education Details options for endurance/activity tolerance with NuStep/Stepper    Person(s) Educated Patient    Methods Explanation;Demonstration    Comprehension Verbalized understanding              PT Short Term Goals - 03/07/21 1535       PT SHORT TERM GOAL #1   Title Pt will participate in further assessment of balance and falls risk (MCTSIB and FGA)    Baseline completed 02-08-21 ; FGA 17/30:  MCTSIB - all 4 conditions 30 secs    Time 4    Period Weeks    Status Achieved    Target Date 02/16/21      PT SHORT TERM GOAL #2   Title Pt will demonstrate independence with initial  vestibular and balance HEP    Time 4    Period Weeks    Status New    Target Date 02/16/21               PT Long Term Goals - 03/06/21 1146       PT LONG TERM GOAL #1   Title Pt will demonstrate independence with final vestibular, balance and LE strength HEP    Time 8    Period Weeks    Status New      PT LONG TERM GOAL #2   Title Pt will demonstrate 4 point improvement in FGA to indicate decreased falls risk to improve safety with walking in community for exercise    Baseline TBD    Time 8    Period Weeks    Status New      PT LONG TERM GOAL #3   Title MCTSIB goal as indicated    Baseline TBD    Time 8    Period Weeks    Status New      PT LONG TERM GOAL #4   Title Pt will demonstrate improved VOR gain as indicated by 2 line difference on DVA    Baseline 3 line difference    Time 8    Period Weeks    Status New      PT LONG TERM GOAL #5   Title Pt will report increase in FOTO to 53 on DFS and >/= 65 on DPS    Time 8    Period Weeks    Status New      PT LONG TERM GOAL #6   Title Pt will demonstrate ability to perform head turns to R in sitting or standing for visual scanning without symptoms of dizziness    Time 8    Period Weeks    Status New                   Plan - 03/08/21 1318     Clinical Impression Statement Continued balance activities, with focused on imcreased vestibular input. Continue to demo most challene with EC and head turns. Due to decreased endurane/strength trialed NuStep with patient tolerating well, eduated on option to complete this after d/c.    Personal Factors and Comorbidities Comorbidity 3+;Fitness;Social Background    Comorbidities Ankylosing spondylitis, CVA in 1999/2001, DM type 2, peripheral neuropathy, dyslipidemia, HTN, OA, seizure disorder 2011, sleep apnea    Examination-Activity Limitations Locomotion Level;Stairs    Examination-Participation  Restrictions Community Activity;Shop    Stability/Clinical  Decision Making Evolving/Moderate complexity    Rehab Potential Good    PT Frequency 2x / week    PT Duration 8 weeks    PT Treatment/Interventions ADLs/Self Care Home Management;Aquatic Therapy;Canalith Repostioning;DME Instruction;Gait training;Stair training;Functional mobility training;Therapeutic activities;Therapeutic exercise;Balance training;Neuromuscular re-education;Patient/family education;Orthotic Fit/Training;Vestibular    PT Next Visit Plan cont balance/vestibular exs.  - EO and EC - on compliant surfaces. 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   Chronic kidney disease, stage III (moderate) ([place]) 07/03/2020   Low magnesium level 04/29/2019   Seborrheic keratosis 05/15/2018   Hearing loss 12/01/2017   BPH (benign prostatic hypertrophy) 12/20/2014   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[place]) 12/20/2014   PSA elevation 12/20/2014   Encounter for therapeutic drug monitoring 12/08/2012   Diabetes mellitus, type 2 ([place])    Seizure disorder ([place])    Hypertension    Hyperlipidemia with target LDL less than 70    CVA (cerebral vascular accident) ([place])    Cluster headaches    Osteoarthritis     Jones Bales, PT, DPT 03/08/2021, 1:22 PM  [facility] [address] [place], [place],  [address] Phone: [phone]   Fax:  [phone]  Name: Andrew Jones MRN: [id number] Date of Birth: 08/22/1940

## 2021-03-13 ENCOUNTER — Ambulatory Visit: Payer: Medicare Other | Admitting: Physical Therapy

## 2021-03-13 ENCOUNTER — Other Ambulatory Visit: Payer: Self-pay

## 2021-03-13 DIAGNOSIS — R2689 Other abnormalities of gait and mobility: Secondary | ICD-10-CM | POA: Diagnosis not present

## 2021-03-13 DIAGNOSIS — R42 Dizziness and giddiness: Secondary | ICD-10-CM | POA: Diagnosis not present

## 2021-03-13 DIAGNOSIS — M6281 Muscle weakness (generalized): Secondary | ICD-10-CM | POA: Diagnosis not present

## 2021-03-13 DIAGNOSIS — R2681 Unsteadiness on feet: Secondary | ICD-10-CM

## 2021-03-14 NOTE — Therapy (Signed)
[facility] [address] [place], [place], [address] Phone: [phone]   Fax:  [phone]  Physical Therapy Treatment  Patient Details  Name: Andrew Jones MRN: [id number] Date of Birth: [birth year] Referring Provider (PT): Garvin Fila, MD   Encounter Date: 03/13/2021   PT End of Session - 03/14/21 1233     Visit Number 9    Number of Visits 17    Date for PT Re-Evaluation 03/18/21    Authorization Type BCBS Medicare; $40 copay    Progress Note Due on Visit 10    PT Start Time 1146    PT Stop Time 1230    PT Time Calculation (min) 44 min    Equipment Utilized During Treatment Gait belt    Activity Tolerance Patient tolerated treatment well    Behavior During Therapy [facility] for tasks assessed/performed             Past Medical History:  Diagnosis Date   Ankylosing spondylitis ([place])    back, hips - on embrel 2000-2008, resumed 12/2012   CVA (cerebral vascular accident) ([place]) 1999, 2002   no residual problems   Diabetes mellitus, type 2 ([place])    Dyslipidemia    Hypertension    Osteoarthritis    Seizure disorder ([place]) 05/2009   Sleep apnea    uses c-pap    Past Surgical History:  Procedure Laterality Date   NO PAST SURGERIES      There were no vitals filed for this visit.   Subjective Assessment - 03/13/21 1152     Subjective Pt reports he is ready to finish up PT this week - states it has definitely helped - not as dizzy as he was    Pertinent History Ankylosing spondylitis, CVA in 1999/2001, DM type 2, dyslipidemia, HTN, OA, seizure disorder 2011, sleep apnea    Limitations Walking;Standing    Diagnostic tests NCV: axonal sensorimotor polyneuropathy; MRI in May: No acute findings    Currently in Pain? No/denies                               [facility] Adult PT Treatment/Exercise - 03/14/21 0001       Exercises   Exercises Knee/Hip      Knee/Hip Exercises: Aerobic    Recumbent Bike SciFit level 5.0 x 5" with UE's and LE's      Knee/Hip Exercises: Machines for Strengthening   Total Gym Leg Press 80# 3 sets 10 reps      Knee/Hip Exercises: Standing   Heel Raises Both;1 set;10 reps   3 sec hold with UE support on hand rails   Forward Step Up Right;Left;1 set;10 reps;Hand Hold: 2;Step Height: 6"    Functional Squat 1 set;10 reps   bil. UE support on // bars - standing on inverted Bosu                Balance Exercises - 03/14/21 0001       Balance Exercises: Standing   Rockerboard Anterior/posterior;EO;EC;Other reps (comment)   10 reps EO and 20  reps EC with UE support prn; also standing statically on board with UE support prn - with horizontal and vertical head turns 5 reps each   Marching Foam/compliant surface;Intermittent upper extremity assist;Head turns;Static;Limitations    Other Standing Exercises Pt performed stepping down to floor from rockerboard with UE support prn - 5 reps each leg forward, then 5 reps  each leg backward    Other Standing Exercises Comments pt performed standing static on rockerboard - horizontal and vertical head turns 5 reps each with UE support prn                  PT Short Term Goals - 03/14/21 1238       PT SHORT TERM GOAL #1   Title Pt will participate in further assessment of balance and falls risk (MCTSIB and FGA)    Baseline completed 02-08-21 ; FGA 17/30:  MCTSIB - all 4 conditions 30 secs    Time 4    Period Weeks    Status Achieved    Target Date 02/16/21      PT SHORT TERM GOAL #2   Title Pt will demonstrate independence with initial vestibular and balance HEP    Time 4    Period Weeks    Status New    Target Date 02/16/21               PT Long Term Goals - 03/14/21 1238       PT LONG TERM GOAL #1   Title Pt will demonstrate independence with final vestibular, balance and LE strength HEP    Time 8    Period Weeks    Status New      PT LONG TERM GOAL #2   Title Pt will  demonstrate 4 point improvement in FGA to indicate decreased falls risk to improve safety with walking in community for exercise    Baseline TBD    Time 8    Period Weeks    Status New      PT LONG TERM GOAL #3   Title MCTSIB goal as indicated    Baseline TBD    Time 8    Period Weeks    Status New      PT LONG TERM GOAL #4   Title Pt will demonstrate improved VOR gain as indicated by 2 line difference on DVA    Baseline 3 line difference    Time 8    Period Weeks    Status New      PT LONG TERM GOAL #5   Title Pt will report increase in FOTO to 53 on DFS and >/= 65 on DPS    Time 8    Period Weeks    Status New      PT LONG TERM GOAL #6   Title Pt will demonstrate ability to perform head turns to R in sitting or standing for visual scanning without symptoms of dizziness    Time 8    Period Weeks    Status New                   Plan - 03/14/21 1233     Clinical Impression Statement PT session focused on bil. LE strengthening, specifically quad strengthening as pt reports he feels that strength in his legs is a bigger problem than his balance.  Session did continue to focus on balance training with increased vestibular input required.  Pt continues to have mild postural instability with head turns with standing on compliant surfaces, indicative of vestibular hypofunction.  Pt tolerated strengthening exercises well - did report legs feeling weak after completion of SciFit exercise but able to take short seated rest break and resume activities.  Plan D/C next session.    Personal Factors and Comorbidities Comorbidity 3+;Fitness;Social Background    Comorbidities Ankylosing spondylitis, CVA in  1999/2001, DM type 2, peripheral neuropathy, dyslipidemia, HTN, OA, seizure disorder 2011, sleep apnea    Examination-Activity Limitations Locomotion Level;Stairs    Examination-Participation Restrictions Community Activity;Shop    Stability/Clinical Decision Making  Evolving/Moderate complexity    Rehab Potential Good    PT Frequency 2x / week    PT Duration 8 weeks    PT Treatment/Interventions ADLs/Self Care Home Management;Aquatic Therapy;Canalith Repostioning;DME Instruction;Gait training;Stair training;Functional mobility training;Therapeutic activities;Therapeutic exercise;Balance training;Neuromuscular re-education;Patient/family education;Orthotic Fit/Training;Vestibular    PT Next Visit Plan Check LTG's and D/C next session    PT [place] and Agree with Plan of Care Patient             Patient will benefit from skilled therapeutic intervention in order to improve the following deficits and impairments:  Abnormal gait, Decreased balance, Decreased strength, Difficulty walking, Dizziness, Impaired sensation  Visit Diagnosis: Other abnormalities of gait and mobility  Unsteadiness on feet  Dizziness and giddiness     Problem List Patient Active Problem List   Diagnosis Date Noted   Obesity 07/03/2020   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   Chronic kidney disease, stage III (moderate) ([place]) 07/03/2020   Low magnesium level 04/29/2019   Seborrheic keratosis 05/15/2018   Hearing loss 12/01/2017   BPH (benign prostatic hypertrophy) 12/20/2014   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[place]) 12/20/2014   PSA elevation 12/20/2014   Encounter for therapeutic drug monitoring 12/08/2012   Diabetes mellitus, type 2 ([place])    Seizure disorder ([place])    Hypertension    Hyperlipidemia with target LDL less than 70    CVA (cerebral vascular accident) [facility])    Cluster headaches    Osteoarthritis     Willean Schurman, [place], PT 03/14/2021, 12:39 PM  [facility] [address] [place], [place], [address] Phone: [phone]   Fax:  [phone]  Name: Andrew Jones MRN: [id number] Date of Birth:  12/06/1940

## 2021-03-15 ENCOUNTER — Ambulatory Visit: Payer: Medicare Other

## 2021-03-15 ENCOUNTER — Other Ambulatory Visit: Payer: Self-pay

## 2021-03-15 DIAGNOSIS — R2681 Unsteadiness on feet: Secondary | ICD-10-CM | POA: Diagnosis not present

## 2021-03-15 DIAGNOSIS — R2689 Other abnormalities of gait and mobility: Secondary | ICD-10-CM

## 2021-03-15 DIAGNOSIS — M6281 Muscle weakness (generalized): Secondary | ICD-10-CM

## 2021-03-15 DIAGNOSIS — R42 Dizziness and giddiness: Secondary | ICD-10-CM | POA: Diagnosis not present

## 2021-03-15 NOTE — Therapy (Signed)
[facility] [address] [place], [place], [address] Phone: [phone]   Fax:  [phone]  Physical Therapy Treatment/Progress Note/Discharge Summary  Patient Details  Name: Andrew Jones MRN: [id number] Date of Birth: [birth year] Referring Provider (PT): Garvin Fila, MD  PHYSICAL THERAPY DISCHARGE SUMMARY  Visits from Start of Care: 10  Current functional level related to goals / functional outcomes: See Clinical Impression Statement   Remaining deficits: Low Fall Risk, Generalized Weakness   Education / Equipment: HEP provided   Patient agrees to discharge. Patient goals were met. Patient is being discharged due to meeting the stated rehab goals.   Encounter Date: 03/15/2021   PT End of Session - 03/15/21 1107     Visit Number 10    Number of Visits 17    Date for PT Re-Evaluation 03/18/21    Authorization Type BCBS Medicare; $40 copay    Progress Note Due on Visit 10    PT Start Time 1102    PT Stop Time 1138    PT Time Calculation (min) 36 min    Equipment Utilized During Treatment Gait belt    Activity Tolerance Patient tolerated treatment well    Behavior During Therapy WFL for tasks assessed/performed             Past Medical History:  Diagnosis Date   Ankylosing spondylitis ([place])    back, hips - on embrel 2000-2008, resumed 12/2012   CVA (cerebral vascular accident) ([place]) 1999, 2002   no residual problems   Diabetes mellitus, type 2 ([place])    Dyslipidemia    Hypertension    Osteoarthritis    Seizure disorder ([place]) 05/2009   Sleep apnea    uses c-pap    Past Surgical History:  Procedure Laterality Date   NO PAST SURGERIES      There were no vitals filed for this visit.   Subjective Assessment - 03/15/21 1105     Subjective Reports that he had some soreness after last session, but went away.No other new changes.    Pertinent History Ankylosing spondylitis, CVA in  1999/2001, DM type 2, dyslipidemia, HTN, OA, seizure disorder 2011, sleep apnea    Limitations Walking;Standing    Diagnostic tests NCV: axonal sensorimotor polyneuropathy; MRI in May: No acute findings    Currently in Pain? No/denies                [facility] PT Assessment - 03/15/21 0001       Assessment   Medical Diagnosis Dizziness; LE weakness    Referring Provider (PT) Garvin Fila, MD      Observation/Other Assessments   Focus on Therapeutic Outcomes (FOTO)  DFS: 62; DPS: 69.8      Functional Gait  Assessment   Gait assessed  Yes    Gait Level Surface Walks 20 ft in less than 5.5 sec, no assistive devices, good speed, no evidence for imbalance, normal gait pattern, deviates no more than 6 in outside of the 12 in walkway width.    Change in Gait Speed Able to change speed, demonstrates mild gait deviations, deviates 6-10 in outside of the 12 in walkway width, or no gait deviations, unable to achieve a major change in velocity, or uses a change in velocity, or uses an assistive device.    Gait with Horizontal Head Turns Performs head turns smoothly with slight change in gait velocity (eg, minor disruption to smooth gait path), deviates 6-10 in outside 12 in  walkway width, or uses an assistive device.    Gait with Vertical Head Turns Performs task with slight change in gait velocity (eg, minor disruption to smooth gait path), deviates 6 - 10 in outside 12 in walkway width or uses assistive device    Gait and Pivot Turn Pivot turns safely within 3 sec and stops quickly with no loss of balance.    Step Over Obstacle Is able to step over one shoe box (4.5 in total height) without changing gait speed. No evidence of imbalance.    Gait with Narrow Base of Support Ambulates 4-7 steps.    Gait with Eyes Closed Walks 20 ft, uses assistive device, slower speed, mild gait deviations, deviates 6-10 in outside 12 in walkway width. Ambulates 20 ft in less than 9 sec but greater than 7 sec.     Ambulating Backwards Walks 20 ft, uses assistive device, slower speed, mild gait deviations, deviates 6-10 in outside 12 in walkway width.    Steps Alternating feet, must use rail.    Total Score 21    FGA comment: 21/30                 Vestibular Assessment - 03/15/21 0001       Visual Acuity   Static 8    Dynamic 8      Positional Sensitivities   Head Turning x 5 No dizziness              [facility] Adult PT Treatment/Exercise - 03/15/21 0001       Neuro Re-ed    Neuro Re-ed Details  Completed M-CTSIB: able to hold situation 1-3 for full 30 seconds, situation 4: 15 seconds. Patient able to complete horizontal head turns x 5 reps seated and x 5 reps standing with no symptoms. Reviewed HEP.            Reviewed HEP:  Access Code: [email or web address] URL: [email or web address] Date: 02/27/2021 Prepared by: Baldomero Lamy   Exercises Standing Gaze Stabilization with Head Rotation - 2 x daily - 7 x weekly - 1 sets - 2 reps Standing Balance in Corner with Eyes Closed - 1 x daily - 7 x weekly - 1 sets - 1 reps Single Leg Stance with Support - 1 x daily - 7 x weekly - 1 sets - 2 reps Side Stepping with Resistance at Thighs and Counter Support - 1 x daily - 7 x weekly - 1 sets - 5 reps Standing Marching - 1 x daily - 7 x weekly - 2 sets - 10 reps Backward Walking with Counter Support - 1 x daily - 7 x weekly - 1 sets - 5 reps Heel Toe Raises with Counter Support - 1 x daily - 7 x weekly - 2 sets - 10 reps    PT Short Term Goals - 03/14/21 1238       PT SHORT TERM GOAL #1   Title Pt will participate in further assessment of balance and falls risk (MCTSIB and FGA)    Baseline completed 02-08-21 ; FGA 17/30:  MCTSIB - all 4 conditions 30 secs    Time 4    Period Weeks    Status Achieved    Target Date 02/16/21      PT SHORT TERM GOAL #2   Title Pt will demonstrate independence with initial vestibular and balance HEP    Time 4    Period Weeks    Status New     Target Date 02/16/21  PT Long Term Goals - 03/15/21 1108       PT LONG TERM GOAL #1   Title Pt will demonstrate independence with final vestibular, balance and LE strength HEP    Baseline reports independence with HEP    Time 8    Period Weeks    Status Achieved      PT LONG TERM GOAL #2   Title Pt will demonstrate 4 point improvement in FGA to indicate decreased falls risk to improve safety with walking in community for exercise    Baseline TBD; 21/30    Time 8    Period Weeks    Status Achieved      PT LONG TERM GOAL #3   Title MCTSIB goal as indicated    Baseline TBD; deferred due to LTG not set    Time 8    Period Weeks    Status Deferred      PT LONG TERM GOAL #4   Title Pt will demonstrate improved VOR gain as indicated by 2 line difference on DVA    Baseline 3 line difference; 0 line difference    Time 8    Period Weeks    Status Achieved      PT LONG TERM GOAL #5   Title Pt will report increase in FOTO to 53 on DFS and >/= 65 on DPS    Baseline DPS: 69.8, DFS: 62    Time 8    Period Weeks    Status Achieved      PT LONG TERM GOAL #6   Title Pt will demonstrate ability to perform head turns to R in sitting or standing for visual scanning without symptoms of dizziness    Baseline No dizziness with standing/seated head turns.    Time 8    Period Weeks    Status Achieved                   Plan - 03/15/21 1150     Clinical Impression Statement Completed assesment of patient's prgoress toward LTG. Patient able to meet all LTGs demonstrating improved balance, reduced fall risk and reduced dizziness. patient WNL on DVA with 0 line difference. Patient able to hold situation 4 of M-CTSIB for 15 seconds, and improved FGA to 21/30 demonstrating low fall risk. Patient has made significant progress with PT services and demo readiness to d/c at this time, with patient agreeable.    Personal Factors and Comorbidities Comorbidity  3+;Fitness;Social Background    Comorbidities Ankylosing spondylitis, CVA in 1999/2001, DM type 2, peripheral neuropathy, dyslipidemia, HTN, OA, seizure disorder 2011, sleep apnea    Examination-Activity Limitations Locomotion Level;Stairs    Examination-Participation Restrictions Community Activity;Shop    Stability/Clinical Decision Making Evolving/Moderate complexity    Rehab Potential Good    PT Frequency 2x / week    PT Duration 8 weeks    PT Treatment/Interventions ADLs/Self Care Home Management;Aquatic Therapy;Canalith Repostioning;DME Instruction;Gait training;Stair training;Functional mobility training;Therapeutic activities;Therapeutic exercise;Balance training;Neuromuscular re-education;Patient/family education;Orthotic Fit/Training;Vestibular    PT Home Exercise Plan [place] and Agree with Plan of Care Patient             Patient will benefit from skilled therapeutic intervention in order to improve the following deficits and impairments:  Abnormal gait, Decreased balance, Decreased strength, Difficulty walking, Dizziness, Impaired sensation  Visit Diagnosis: Other abnormalities of gait and mobility  Unsteadiness on feet  Dizziness and giddiness  Muscle weakness (generalized)     Problem List  Patient Active Problem List   Diagnosis Date Noted   Obesity 07/03/2020   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)    Cluster headaches    Osteoarthritis     Jones Bales, PT, DPT 03/15/2021, 11:51 AM  [facility] [address] [place], [place], [address] Phone: [phone]   Fax:  [phone]  Name: JONAVEN HILGERS MRN: [id number] Date of Birth: 1940-10-28

## 2021-03-20 ENCOUNTER — Encounter: Payer: Medicare Other | Admitting: Physical Therapy

## 2021-03-28 ENCOUNTER — Ambulatory Visit (INDEPENDENT_AMBULATORY_CARE_PROVIDER_SITE_OTHER): Payer: Medicare Other

## 2021-03-28 DIAGNOSIS — Z0000 Encounter for general adult medical examination without abnormal findings: Secondary | ICD-10-CM

## 2021-03-28 NOTE — Patient Instructions (Signed)
Andrew Jones , Thank you for taking time to come for your Medicare Wellness Visit. I appreciate your ongoing commitment to your health goals. Please review the following plan we discussed and let me know if I can assist you in the future.   Screening recommendations/referrals: Colonoscopy: no longer required  Recommended yearly ophthalmology/optometry visit for glaucoma screening and checkup Recommended yearly dental visit for hygiene and checkup  Vaccinations: Influenza vaccine: completed Pneumococcal vaccine: completed  Tdap vaccine: 09/16/2011  due 08/2021 Shingles vaccine: completed     Advanced directives: yes   Conditions/risks identified: none   Next appointment: none   Preventive Care 73 Years and Older, Male Preventive care refers to lifestyle choices and visits with your health care provider that can promote health and wellness. What does preventive care include? A yearly physical exam. This is also called an annual well check. Dental exams once or twice a year. Routine eye exams. Ask your health care provider how often you should have your eyes checked. Personal lifestyle choices, including: Daily care of your teeth and gums. Regular physical activity. Eating a healthy diet. Avoiding tobacco and drug use. Limiting alcohol use. Practicing safe sex. Taking low doses of aspirin every day. Taking vitamin and mineral supplements as recommended by your health care provider. What happens during an annual well check? The services and screenings done by your health care provider during your annual well check will depend on your age, overall health, lifestyle risk factors, and family history of disease. Counseling  Your health care provider may ask you questions about your: Alcohol use. Tobacco use. Drug use. Emotional well-being. Home and relationship well-being. Sexual activity. Eating habits. History of falls. Memory and ability to understand (cognition). Work and  work Statistician. Screening  You may have the following tests or measurements: Height, weight, and BMI. Blood pressure. Lipid and cholesterol levels. These may be checked every 5 years, or more frequently if you are over 61 years old. Skin check. Lung cancer screening. You may have this screening every year starting at age 42 if you have a 30-pack-year history of smoking and currently smoke or have quit within the past 15 years. Fecal occult blood test (FOBT) of the stool. You may have this test every year starting at age 29. Flexible sigmoidoscopy or colonoscopy. You may have a sigmoidoscopy every 5 years or a colonoscopy every 10 years starting at age 53. Prostate cancer screening. Recommendations will vary depending on your family history and other risks. Hepatitis C blood test. Hepatitis B blood test. Sexually transmitted disease (STD) testing. Diabetes screening. This is done by checking your blood sugar (glucose) after you have not eaten for a while (fasting). You may have this done every 1-3 years. Abdominal aortic aneurysm (AAA) screening. You may need this if you are a current or former smoker. Osteoporosis. You may be screened starting at age 44 if you are at high risk. Talk with your health care provider about your test results, treatment options, and if necessary, the need for more tests. Vaccines  Your health care provider may recommend certain vaccines, such as: Influenza vaccine. This is recommended every year. Tetanus, diphtheria, and acellular pertussis (Tdap, Td) vaccine. You may need a Td booster every 10 years. Zoster vaccine. You may need this after age 67. Pneumococcal 13-valent conjugate (PCV13) vaccine. One dose is recommended after age 65. Pneumococcal polysaccharide (PPSV23) vaccine. One dose is recommended after age 37. Talk to your health care provider about which screenings and vaccines you need  and how often you need them. This information is not intended to  replace advice given to you by your health care provider. Make sure you discuss any questions you have with your health care provider. Document Released: 04/07/2015 Document Revised: 11/29/2015 Document Reviewed: 01/10/2015 Elsevier Interactive Patient Education  2017 [place] Prevention in the Home Falls can cause injuries. They can happen to people of all ages. There are many things you can do to make your home safe and to help prevent falls. What can I do on the outside of my home? Regularly fix the edges of walkways and driveways and fix any cracks. Remove anything that might make you trip as you walk through a door, such as a raised step or threshold. Trim any bushes or trees on the path to your home. Use bright outdoor lighting. Clear any walking paths of anything that might make someone trip, such as rocks or tools. Regularly check to see if handrails are loose or broken. Make sure that both sides of any steps have handrails. Any raised decks and porches should have guardrails on the edges. Have any leaves, snow, or ice cleared regularly. Use sand or salt on walking paths during winter. Clean up any spills in your garage right away. This includes oil or grease spills. What can I do in the bathroom? Use night lights. Install grab bars by the toilet and in the tub and shower. Do not use towel bars as grab bars. Use non-skid mats or decals in the tub or shower. If you need to sit down in the shower, use a plastic, non-slip stool. Keep the floor dry. Clean up any water that spills on the floor as soon as it happens. Remove soap buildup in the tub or shower regularly. Attach bath mats securely with double-sided non-slip rug tape. Do not have throw rugs and other things on the floor that can make you trip. What can I do in the bedroom? Use night lights. Make sure that you have a light by your bed that is easy to reach. Do not use any sheets or blankets that are too big for  your bed. They should not hang down onto the floor. Have a firm chair that has side arms. You can use this for support while you get dressed. Do not have throw rugs and other things on the floor that can make you trip. What can I do in the kitchen? Clean up any spills right away. Avoid walking on wet floors. Keep items that you use a lot in easy-to-reach places. If you need to reach something above you, use a strong step stool that has a grab bar. Keep electrical cords out of the way. Do not use floor polish or wax that makes floors slippery. If you must use wax, use non-skid floor wax. Do not have throw rugs and other things on the floor that can make you trip. What can I do with my stairs? Do not leave any items on the stairs. Make sure that there are handrails on both sides of the stairs and use them. Fix handrails that are broken or loose. Make sure that handrails are as long as the stairways. Check any carpeting to make sure that it is firmly attached to the stairs. Fix any carpet that is loose or worn. Avoid having throw rugs at the top or bottom of the stairs. If you do have throw rugs, attach them to the floor with carpet tape. Make sure that you  have a light switch at the top of the stairs and the bottom of the stairs. If you do not have them, ask someone to add them for you. What else can I do to help prevent falls? Wear shoes that: Do not have high heels. Have rubber bottoms. Are comfortable and fit you well. Are closed at the toe. Do not wear sandals. If you use a stepladder: Make sure that it is fully opened. Do not climb a closed stepladder. Make sure that both sides of the stepladder are locked into place. Ask someone to hold it for you, if possible. Clearly mark and make sure that you can see: Any grab bars or handrails. First and last steps. Where the edge of each step is. Use tools that help you move around (mobility aids) if they are needed. These  include: Canes. Walkers. Scooters. Crutches. Turn on the lights when you go into a dark area. Replace any light bulbs as soon as they burn out. Set up your furniture so you have a clear path. Avoid moving your furniture around. If any of your floors are uneven, fix them. If there are any pets around you, be aware of where they are. Review your medicines with your doctor. Some medicines can make you feel dizzy. This can increase your chance of falling. Ask your doctor what other things that you can do to help prevent falls. This information is not intended to replace advice given to you by your health care provider. Make sure you discuss any questions you have with your health care provider. Document Released: 01/05/2009 Document Revised: 08/17/2015 Document Reviewed: 04/15/2014 Elsevier Interactive Patient Education  2017 [employer].

## 2021-03-28 NOTE — Progress Notes (Signed)
Subjective:   Andrew Jones is a 81 y.o. male who presents for an Subsequent Medicare Annual Wellness Visit.  Review of Systems     Cardiac Risk Factors include: advanced age (>73men, >33 women);diabetes mellitus;dyslipidemia;male gender     Objective:    Today's Vitals   There is no height or weight on file to calculate BMI.  Advanced Directives 03/28/2021 11/22/2020 12/09/2019 11/01/2019 12/20/2014 12/07/2014  Does Patient Have a Medical Advance Directive? Yes Yes Yes No No No;Yes  Type of Paramedic of [place];Living will Living will;Healthcare Power of [place];Living will - Living will;Healthcare Power of [place];Living will  Does patient want to make changes to medical advance directive? - No - Patient declined - - - -  Copy of [place] in Chart? No - copy requested No - copy requested No - copy requested - Yes No - copy requested  Would patient like information on creating a medical advance directive? - - - No - Patient declined No - patient declined information -    Current Medications (verified) Outpatient Encounter Medications as of 03/28/2021  Medication Sig   amLODipine (NORVASC) 10 MG tablet Take 1 tablet (10 mg total) by mouth daily.   aspirin EC 81 MG tablet Take 1 tablet (81 mg total) by mouth daily. Swallow whole.   benazepril (LOTENSIN) 10 MG tablet Take 10 mg by mouth daily.   cholecalciferol (VITAMIN D) 1000 UNITS tablet Take 1,000 Units by mouth daily.   diclofenac (VOLTAREN) 75 MG EC tablet Take 1 tablet (75 mg total) by mouth daily.   etanercept (ENBREL) 50 MG/ML injection Inject 50 mg into the skin once a week.   fish oil-omega-3 fatty acids 1000 MG capsule Take 2 g by mouth daily.   Magnesium Oxide 420 MG TABS Take 1 tablet (420 mg total) by mouth daily after breakfast.   metFORMIN (GLUCOPHAGE) 1000 MG tablet Take 1,000 mg by mouth 2 (two) times daily with a  meal.   metoprolol tartrate (LOPRESSOR) 25 MG tablet Take 25 mg by mouth 2 (two) times daily.   Multiple Vitamin (MULTIVITAMIN) tablet Take 1 tablet by mouth daily.   omeprazole (PRILOSEC) 20 MG capsule Take 20 mg by mouth daily.   phenytoin (DILANTIN) 100 MG ER capsule Take 100 mg by mouth. 1 capsule twice daily into 2 months then 1 capsule daily into 2 months and then stop   pioglitazone (ACTOS) 15 MG tablet Take 15 mg by mouth daily.   Vitamin D, Ergocalciferol, (DRISDOL) 1.25 MG (50000 UNIT) CAPS capsule TAKE 1 CAPSULE BY MOUTH EVERY 7 DAYS   sulfaSALAzine (AZULFIDINE) 500 MG tablet Take 500 mg by mouth 3 (three) times daily.   No facility-administered encounter medications on file as of 03/28/2021.    Allergies (verified) Patient has no known allergies.   History: Past Medical History:  Diagnosis Date   Ankylosing spondylitis ([place])    back, hips - on embrel 2000-2008, resumed 12/2012   CVA (cerebral vascular accident) ([place]) 1999, 2002   no residual problems   Diabetes mellitus, type 2 ([place])    Dyslipidemia    Hypertension    Osteoarthritis    Seizure disorder ([place]) 05/2009   Sleep apnea    uses c-pap   Past Surgical History:  Procedure Laterality Date   NO PAST SURGERIES     Family History  Problem Relation Age of Onset   Heart failure Mother  Heart disease Mother    COPD Father    Prostate cancer Brother    Social History   Socioeconomic History   Marital status: Married    Spouse name: Not on file   Number of children: 1   Years of education: 13   Highest education level: Not on file  Occupational History   Occupation: Retired  Tobacco Use   Smoking status: Former   Smokeless tobacco: Never   Tobacco comments:    30 yrs ago  Scientific laboratory technician Use: Never used  Substance and Sexual Activity   Alcohol use: Not Currently   Drug use: No   Sexual activity: Yes  Other Topics Concern   Not on file  Social History Narrative   Lives with wife   Right  Handed   Drinks 2-3 cups caffeine daily   Social Determinants of Health   Financial Resource Strain: Low Risk    Difficulty of Paying Living Expenses: Not hard at all  Food Insecurity: No Food Insecurity   Worried About Charity fundraiser in the Last Year: Never true   Arboriculturist in the Last Year: Never true  Transportation Needs: No Transportation Needs   Lack of Transportation (Medical): No   Lack of Transportation (Non-Medical): No  Physical Activity: Inactive   Days of Exercise per Week: 0 days   Minutes of Exercise per Session: 0 min  Stress: No Stress Concern Present   Feeling of Stress : Not at all  Social Connections: Moderately Isolated   Frequency of Communication with Friends and Family: Twice a week   Frequency of Social Gatherings with Friends and Family: Twice a week   Attends Religious Services: Never   Marine scientist or Organizations: No   Attends Music therapist: Never   Marital Status: Married    Tobacco Counseling Counseling given: Not Answered Tobacco comments: 30 yrs ago   Clinical Intake:  Pre-visit preparation completed: Yes  Pain : No/denies pain     Nutritional Risks: None Diabetes: Yes CBG done?: No Did pt. bring in CBG monitor from home?: No  How often do you need to have someone help you when you read instructions, pamphlets, or other written materials from your doctor or pharmacy?: 1 - Never What is the last grade level you completed in school?: college  Diabetic?yes Nutrition Risk Assessment:  Has the patient had any N/V/D within the last 2 months?  No  Does the patient have any non-healing wounds?  No  Has the patient had any unintentional weight loss or weight gain?  No   Diabetes:  Is the patient diabetic?  Yes  If diabetic, was a CBG obtained today?  No  Did the patient bring in their glucometer from home?  No  How often do you monitor your CBG's? Never .   Financial Strains and Diabetes  Management:  Are you having any financial strains with the device, your supplies or your medication? No .  Does the patient want to be seen by Chronic Care Management for management of their diabetes?  No  Would the patient like to be referred to a Nutritionist or for Diabetic Management?  No   Diabetic Exams:  Diabetic Eye Exam: Completed 08/2020 Diabetic Foot Exam: Overdue, Pt has been advised about the importance in completing this exam. Pt is scheduled for diabetic foot exam on next office visit .   Interpreter Needed?: No  Information entered by :: [email or web address]  Activities of Daily Living In your present state of health, do you have any difficulty performing the following activities: 03/28/2021 03/28/2021  Hearing? N N  Vision? N N  Difficulty concentrating or making decisions? N N  Walking or climbing stairs? N Y  Dressing or bathing? N N  Doing errands, shopping? N N  Preparing Food and eating ? N N  Using the Toilet? N N  In the past six months, have you accidently leaked urine? N N  Do you have problems with loss of bowel control? N N  Managing your Medications? N N  Managing your Finances? N N  Housekeeping or managing your Housekeeping? N N  Some recent data might be hidden    Patient Care Team: N[place], Charlene Brooke, NP as PCP - General (Internal Medicine) Garvin Fila, MD (Neurology) [place], Heather, [place] (Optometry) Irene Shipper, MD (Gastroenterology)  Indicate any recent Medical Services you may have received from other than [facility] providers in the past year (date may be approximate).     Assessment:   This is a routine wellness examination for Yuniel.  Hearing/Vision screen Vision Screening - Comments:: Annual eye exams   Dietary issues and exercise activities discussed: Current Exercise Habits: The patient does not participate in regular exercise at present, Exercise limited by: None identified   Goals Addressed             This Visit's Progress     Patient Stated   On track    Continue healthy eating & exercising       Depression Screen PHQ 2/9 Scores 03/28/2021 03/28/2021 12/09/2019 04/27/2019 07/02/2017 12/20/2014 12/20/2014  PHQ - 2 Score 0 0 0 0 0 0 0    Fall Risk Fall Risk  03/28/2021 03/28/2021 12/09/2019 04/27/2019 07/02/2017  Falls in the past year? 0 0 0 0 No  Number falls in past yr: 0 0 0 - -  Injury with Fall? 0 0 0 - -  Follow up Falls evaluation completed - Falls prevention discussed - -    FALL RISK PREVENTION PERTAINING TO THE HOME:  Any stairs in or around the home? Yes  If so, are there any without handrails? No  Home free of loose throw rugs in walkways, pet beds, electrical cords, etc? Yes  Adequate lighting in your home to reduce risk of falls? Yes   ASSISTIVE DEVICES UTILIZED TO PREVENT FALLS:  Life alert? No  Use of a cane, walker or w/c? Yes  Grab bars in the bathroom? Yes  Shower chair or bench in shower? Yes  Elevated toilet seat or a handicapped toilet? Yes    Cognitive Function:  Normal cognitive status assessed by direct observation by this Nurse Health Advisor. No abnormalities found.        Immunizations Immunization History  Administered Date(s) Administered   Fluad Quad(high Dose 65+) 12/23/2018, 12/26/2020   Influenza Split 12/24/2011, 12/24/2014, 12/23/2016   Influenza, High Dose Seasonal PF 12/23/2012, 12/25/2013, 12/20/2014, 12/28/2016, 11/30/2017   Influenza-Unspecified 12/24/2003, 12/23/2004, 12/23/2005, 01/24/2007, 12/24/2007, 01/23/2009, 07/17/2010, 12/24/2010, 01/08/2012, 01/07/2019, 01/07/2020   Moderna Sars-Covid-2 Vaccination 04/06/2019, 04/27/2019, 05/04/2019, 05/18/2019, 12/27/2019, 09/07/2020   Pneumococcal Conjugate-13 05/23/2008, 10/26/2013   Pneumococcal Polysaccharide-23 10/23/2012   Pneumococcal-Unspecified 02/24/2004, 11/01/2013   Tdap 06/29/2011, 09/16/2011   Zoster Recombinat (Shingrix) 01/10/2021   Zoster, Live 10/23/2012, 03/26/2015    TDAP status: Up to date  Flu  Vaccine status: Up to date  Pneumococcal vaccine status: Up to date  Covid-19 vaccine status: Completed vaccines  Qualifies  for Shingles Vaccine? Yes   Zostavax completed Yes   Shingrix Completed?: Yes  Screening Tests Health Maintenance  Topic Date Due   FOOT EXAM  12/20/2015   OPHTHALMOLOGY EXAM  11/27/2017   HEMOGLOBIN A1C  01/02/2021   Zoster Vaccines- Shingrix (2 of 2) 03/07/2021   TETANUS/TDAP  09/15/2021   Pneumonia Vaccine 34+ Years old  Completed   INFLUENZA VACCINE  Completed   COVID-19 Vaccine  Completed   Hepatitis C Screening  Completed   HPV VACCINES  Aged Out   COLONOSCOPY (Pts 45-53yrs Insurance coverage will need to be confirmed)  Discontinued    Health Maintenance  Health Maintenance Due  Topic Date Due   FOOT EXAM  12/20/2015   OPHTHALMOLOGY EXAM  11/27/2017   HEMOGLOBIN A1C  01/02/2021   Zoster Vaccines- Shingrix (2 of 2) 03/07/2021    Colorectal cancer screening: No longer required.   Lung Cancer Screening: (Low Dose CT Chest recommended if Age 34-80 years, 30 pack-year currently smoking OR have quit w/in 15years.) does not qualify.   Lung Cancer Screening Referral: n/a  Additional Screening:  Hepatitis C Screening: does not qualify;   Vision Screening: Recommended annual ophthalmology exams for early detection of glaucoma and other disorders of the eye. Is the patient up to date with their annual eye exam?  Yes  Who is the provider or what is the name of the office in which the patient attends annual eye exams? Dr.Omen If pt is not established with a provider, would they like to be referred to a provider to establish care? No .   Dental Screening: Recommended annual dental exams for proper oral hygiene  Community Resource Referral / Chronic Care Management: CRR required this visit?  No   CCM required this visit?  No      Plan:     I have personally reviewed and noted the following in the patients chart:   Medical and social  history Use of alcohol, tobacco or illicit drugs  Current medications and supplements including opioid prescriptions. Patient is not currently taking opioid prescriptions. Functional ability and status Nutritional status Physical activity Advanced directives List of other physicians Hospitalizations, surgeries, and ER visits in previous 12 months Vitals Screenings to include cognitive, depression, and falls Referrals and appointments  In addition, I have reviewed and discussed with patient certain preventive protocols, quality metrics, and best practice recommendations. A written personalized care plan for preventive services as well as general preventive health recommendations were provided to patient.     Randel Pigg, LPN   [id number]/05/[id number]   Nurse Notes: none

## 2021-04-19 ENCOUNTER — Ambulatory Visit: Payer: Medicare Other | Admitting: Neurology

## 2021-04-19 ENCOUNTER — Encounter: Payer: Self-pay | Admitting: Neurology

## 2021-04-19 VITALS — BP 147/81 | HR 74 | Ht 68.0 in | Wt 186.0 lb

## 2021-04-19 DIAGNOSIS — M6281 Muscle weakness (generalized): Secondary | ICD-10-CM | POA: Diagnosis not present

## 2021-04-19 DIAGNOSIS — Z87898 Personal history of other specified conditions: Secondary | ICD-10-CM | POA: Diagnosis not present

## 2021-04-19 NOTE — Progress Notes (Signed)
[facility] [address] [place]. [place] [address] [phone]       OFFICE FOLLOW UP VISIT NOTE  Andrew Jones Date of Birth:  September 02, 1940 Medical Record Number:  [id number]   Referring MD: Arlester Marker  Reason for Referral: Proximal weakness  HPI: Initial visit 09/20/2020 :Andrew Jones is a pleasant 81 year old Caucasian male seen today for office consultation visit for proximal leg weakness.  He is accompanied by his daughter-in-law Andrew Jones.  History is obtained from them and review of electronic medical records and I personally reviewed available imaging films which are pertinent in PACS.  He has past medical history of diabetes, hypertension, seizures, sleep apnea, arthritis stroke.  He states he has noticed sudden onset of bilateral lower extremity proximal weakness for the last couple of months.  This happened shortly after he had had COVID infection in February.  He has noticed difficulty getting out of a chair requiring push himself up.  This is in both legs.  Denies any muscle pains, cramps and has not noticed any fasciculation at 1 2 movements.  Feels his balance is also slightly poor.  He has some good days and bad days.  He has been using a cane and can walk quite well and has had no falls or injuries.  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  He denies any weakness in his hands and has not dropping objects or noticed any decrease in his grip strength.  His diabetes has been under good control and last hemoglobin A1c was 6.0.  Patient was on a different statin in about 9 months ago was switched to Lipitor 40 mg which he is taking 3 times a week.  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 Comprehensive metabolic panel on 2/33/0076 was fairly unremarkable except for low sodium of  134.  TSH is normal.  LDL cholesterol was 129 mg percent.  Hemoglobin A1c was 6.1.  He denies any back pain, radicular pain, falls or injury. He has past neurological history of seizures since March 2011 and has followed up in with me in the office with the last visit being on 12/14/2018.  He is on Dilantin 200 mg daily and has not had a breakthrough seizure for more than 10 years. Update 12/26/2020 : He returns for follow-up after last visit 3 months ago.  He states he is doing slightly better his has stopped Lipitor 3 months ago and is noted improvement in myalgias and thigh pain however he still has some proximal weakness.  He has mild tingling numbness in his feet but states his gait and balance are unchanged.  He uses a cane for long distances.  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  Lab work on 09/20/2020 showed normal aldolase, CK and LDH.  C-reactive protein and ESR were also normal.  Vitamin D level was slightly low at 28.2 and phenytoin level was also low at 6.3.  Patient has not yet made an appointment to see his primary care physician to increase his vitamin D dose.  He has a new complaint of positional transient dizziness which he describes as vertigo sensation when he turns his neck quickly to the right.  He is seeing his audiologist who is  recommending vestibular stabilization exercises.  He has not had a seizure since 2011 and remains on Dilantin 300 mg daily and is willing to taper and stop it over the next few months. Update 04/19/2021 ; he returns for follow-up after last visit with me 3 months ago.  He states he is doing much better and has noticed improvement in his proximal leg weakness in fact for the last 1 week he feels he has been weakness has practically disappeared.  He denies any pain in the muscles in the thighs or legs.  He continues to have minor paresthesias in his feet but this is  longstanding and not progressive.  He is also tapered Dilantin to 1 tablet a day and has not had any breakthrough seizures and plans to stop it in 3 to 4 weeks.  She has no new complaints today. 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, 2002   no residual problems   Diabetes mellitus, type 2 ([facility])    Dyslipidemia    Hypertension    Osteoarthritis    Seizure disorder ([place]) 05/2009   Sleep apnea    uses c-pap    Social History:  Social History   Socioeconomic History   Marital status: Married    Spouse name: Not on file   Number of children: 1   Years of education: 13   Highest education level: Not on file  Occupational History   Occupation: Retired  Tobacco Use   Smoking status: Former   Smokeless tobacco: Never   Tobacco comments:    30 yrs ago  Scientific laboratory technician Use: Never used  Substance and Sexual Activity   Alcohol use: Not Currently   Drug use: No   Sexual activity: Yes  Other Topics Concern   Not on file  Social History Narrative   Lives with wife   Right Handed   Drinks 2-3 cups caffeine daily   Social Determinants of Health   Financial Resource Strain: Low Risk    Difficulty of Paying Living Expenses: Not hard at all  Food Insecurity: No Food Insecurity   Worried About Charity fundraiser in the Last Year: Never true   Arboriculturist in the Last Year: Never true  Transportation Needs: No Transportation Needs   Lack of Transportation (Medical): No   Lack of Transportation (Non-Medical): No  Physical Activity: Inactive   Days of Exercise per Week: 0 days   Minutes of Exercise per Session: 0 min  Stress: No Stress Concern Present   Feeling of  Stress : Not at all  Social Connections: Moderately Isolated   Frequency of Communication with Friends and Family: Twice a week   Frequency of Social Gatherings with Friends and Family: Twice a week   Attends Religious Services: Never   Marine scientist or Organizations: No   Attends Music therapist: Never   Marital Status: Married  Human resources officer Violence: Not At Risk   Fear of Current or Ex-Partner: No   Emotionally Abused: No   Physically Abused: No   Sexually Abused: No    Medications:   Current Outpatient Medications on File Prior to Visit  Medication Sig Dispense Refill   amLODipine (NORVASC) 10 MG tablet Take 1 tablet (10 mg total) by mouth daily.     aspirin EC 81 MG tablet Take 1 tablet (81 mg total) by mouth daily. Swallow whole. 30 tablet 11   benazepril (LOTENSIN) 10 MG tablet Take 10 mg by mouth daily.     cholecalciferol (VITAMIN D) 1000 UNITS tablet Take 1,000 Units by mouth daily.     diclofenac (VOLTAREN) 75 MG EC tablet Take 1 tablet (75 mg total) by mouth daily. 30 tablet 5   etanercept (ENBREL) 50 MG/ML injection Inject 50 mg into the skin once a week.     fish oil-omega-3 fatty acids 1000 MG capsule Take 2 g by mouth daily.     Magnesium Oxide 420 MG TABS Take 1 tablet (420 mg total) by mouth daily after breakfast.  0   metoprolol tartrate (LOPRESSOR) 25 MG tablet Take 25 mg by mouth 2 (two) times daily.     Multiple Vitamin (MULTIVITAMIN) tablet Take 1 tablet by mouth daily.     omeprazole (PRILOSEC) 20 MG capsule Take 20 mg by mouth daily.     phenytoin (DILANTIN) 100 MG ER capsule Take 100 mg by mouth. 1 capsule twice daily into 2 months then 1 capsule daily into 2 months and then stop     pioglitazone (ACTOS) 15 MG tablet Take 15 mg by mouth daily.     Vitamin D, Ergocalciferol, (DRISDOL) 1.25 MG (50000 UNIT) CAPS capsule TAKE 1 CAPSULE BY MOUTH EVERY 7 DAYS 4 capsule 2   No current facility-administered medications on file prior to  visit.    Allergies:  No Known Allergies  Physical Exam General: well developed, well nourished elderly Caucasian male, seated, in no evident distress Head: head normocephalic and atraumatic.   Neck: supple with no carotid or supraclavicular bruits Cardiovascular: regular rate and rhythm, no murmurs Musculoskeletal: no deformity Skin:  no rash/petichiae Vascular:  Normal pulses all extremities  Neurologic Exam Mental Status: Awake and fully alert. Oriented to place and time. Recent and remote memory intact. Attention span, concentration and fund of knowledge appropriate. Mood and affect appropriate.  Cranial Nerves: Fundoscopic exam not don.. Pupils equal, briskly reactive to light. Extraocular movements full without nystagmus. Visual fields full to confrontation. Hearing intact. Facial sensation intact. Face, tongue, palate moves normally and symmetrically.  Motor: Normal bulk and tone. Normal strength in all tested extremity muscles.  Mild weakness of bilateral hip flexors 4/5.  Right greater than left.  No muscle wasting noted.  No fasciculations noted. Sensory.:  Mildly diminished touch , pinprick , position and vibratory sensation.  In both legs from ankle down. Coordination: Rapid alternating movements normal in all extremities. Finger-to-nose and heel-to-shin performed accurately bilaterally. Gait and Station: Arises from chair without difficulty. Stance is normal. Gait demonstrates normal stride length and balance . Able to heel, toe and tandem walk without difficulty. . Reflexes: 1+ and symmetric. Toes downgoing.      ASSESSMENT: 81 year old Caucasian male with sudden onset of proximal lower extremity weakness  closely following COVID infection of unclear etiology.  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  He has shown near complete improvement after stopping statins.  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  Remote history of seizure in 2011 on long-term Dilantin has remained seizure-free and is  tapering and stopping it     PLAN:I had a long discussion with the patient with regards  to his proximal leg weakness which appears to have improved and may have been related to post-COVID illness versus statin myopathy.  He has mild underlying diabetic axonal peripheral neuropathy which is stable.  Continue tapering Dilantin to 1 tablet daily for the next 3 to 4 weeks and discontinue as he has been seizure-free for several years.  Continue strict control of diabetes with hemoglobin A1c goal below 6.5.  No scheduled follow-up appointment with me is necessary but he will call if needed.Greater than 50% time during this 35-minute   visit was spent on counseling and coordination of care about his proximal leg weakness and discussion of differential diagnosis and evaluation plan and answering questions. Antony Contras, MD  Note: This document was prepared with digital dictation and possible smart phrase technology. Any transcriptional errors that result from this process are unintentional.

## 2021-04-19 NOTE — Patient Instructions (Signed)
I had a long discussion with the patient with regards to his proximal leg weakness which appears to have improved and may have been related to post-COVID illness versus statin myopathy.  He has mild underlying diabetic axonal peripheral neuropathy which is stable.  Continue tapering Dilantin to 1 tablet daily for the next 3 to 4 weeks and discontinue as he has been seizure-free for several years.  Continue strict control of diabetes with hemoglobin A1c goal below 6.5.  No scheduled follow-up appointment with me is necessary but he will call if needed.

## 2021-04-29 ENCOUNTER — Encounter: Payer: Self-pay | Admitting: Neurology

## 2021-04-29 ENCOUNTER — Encounter: Payer: Self-pay | Admitting: Nurse Practitioner

## 2021-05-04 NOTE — Telephone Encounter (Signed)
Please advise message below  °

## 2021-05-04 NOTE — Telephone Encounter (Signed)
Pt called to follow up on this since he hasnt heard anything since 04/29/21. Please advise.

## 2021-05-05 ENCOUNTER — Encounter: Payer: Self-pay | Admitting: Neurology

## 2021-05-05 ENCOUNTER — Encounter: Payer: Self-pay | Admitting: Nurse Practitioner

## 2021-05-08 ENCOUNTER — Other Ambulatory Visit: Payer: Self-pay | Admitting: Nurse Practitioner

## 2021-05-08 DIAGNOSIS — E559 Vitamin D deficiency, unspecified: Secondary | ICD-10-CM

## 2021-06-09 ENCOUNTER — Other Ambulatory Visit: Payer: Self-pay | Admitting: Nurse Practitioner

## 2021-06-09 DIAGNOSIS — E559 Vitamin D deficiency, unspecified: Secondary | ICD-10-CM

## 2021-06-20 ENCOUNTER — Emergency Department (HOSPITAL_COMMUNITY)
Admission: EM | Admit: 2021-06-20 | Discharge: 2021-06-20 | Disposition: A | Discharge status: Home / Self Care | Payer: Medicare Other | Attending: Emergency Medicine | Admitting: Emergency Medicine

## 2021-06-20 ENCOUNTER — Other Ambulatory Visit: Payer: Self-pay

## 2021-06-20 ENCOUNTER — Emergency Department (HOSPITAL_COMMUNITY): Payer: Medicare Other

## 2021-06-20 DIAGNOSIS — E119 Type 2 diabetes mellitus without complications: Secondary | ICD-10-CM | POA: Insufficient documentation

## 2021-06-20 DIAGNOSIS — R29818 Other symptoms and signs involving the nervous system: Secondary | ICD-10-CM | POA: Diagnosis not present

## 2021-06-20 DIAGNOSIS — Z79899 Other long term (current) drug therapy: Secondary | ICD-10-CM | POA: Insufficient documentation

## 2021-06-20 DIAGNOSIS — Z8673 Personal history of transient ischemic attack (TIA), and cerebral infarction without residual deficits: Secondary | ICD-10-CM | POA: Insufficient documentation

## 2021-06-20 DIAGNOSIS — R42 Dizziness and giddiness: Secondary | ICD-10-CM | POA: Diagnosis not present

## 2021-06-20 DIAGNOSIS — R9082 White matter disease, unspecified: Secondary | ICD-10-CM | POA: Insufficient documentation

## 2021-06-20 DIAGNOSIS — Z7982 Long term (current) use of aspirin: Secondary | ICD-10-CM | POA: Diagnosis not present

## 2021-06-20 DIAGNOSIS — I6523 Occlusion and stenosis of bilateral carotid arteries: Secondary | ICD-10-CM | POA: Diagnosis not present

## 2021-06-20 DIAGNOSIS — H538 Other visual disturbances: Secondary | ICD-10-CM | POA: Diagnosis not present

## 2021-06-20 LAB — I-STAT CHEM 8, ED
BUN: 17 mg/dL (ref 8–23)
Calcium, Ion: 1.08 mmol/L — ABNORMAL LOW (ref 1.15–1.40)
Chloride: 101 mmol/L (ref 98–111)
Creatinine, Ser: 0.8 mg/dL (ref 0.61–1.24)
Glucose, Bld: 198 mg/dL — ABNORMAL HIGH (ref 70–99)
HCT: 34 % — ABNORMAL LOW (ref 39.0–52.0)
Hemoglobin: 11.6 g/dL — ABNORMAL LOW (ref 13.0–17.0)
Potassium: 3.8 mmol/L (ref 3.5–5.1)
Sodium: 133 mmol/L — ABNORMAL LOW (ref 135–145)
TCO2: 20 mmol/L — ABNORMAL LOW (ref 22–32)

## 2021-06-20 LAB — CBC WITH DIFFERENTIAL/PLATELET
Abs Immature Granulocytes: 0.04 10*3/uL (ref 0.00–0.07)
Basophils Absolute: 0.1 10*3/uL (ref 0.0–0.1)
Basophils Relative: 1 %
Eosinophils Absolute: 0.3 10*3/uL (ref 0.0–0.5)
Eosinophils Relative: 4 %
HCT: 32.9 % — ABNORMAL LOW (ref 39.0–52.0)
Hemoglobin: 11.2 g/dL — ABNORMAL LOW (ref 13.0–17.0)
Immature Granulocytes: 1 %
Lymphocytes Relative: 33 %
Lymphs Abs: 2.7 10*3/uL (ref 0.7–4.0)
MCH: 28.9 pg (ref 26.0–34.0)
MCHC: 34 g/dL (ref 30.0–36.0)
MCV: 85 fL (ref 80.0–100.0)
Monocytes Absolute: 0.9 10*3/uL (ref 0.1–1.0)
Monocytes Relative: 11 %
Neutro Abs: 4.1 10*3/uL (ref 1.7–7.7)
Neutrophils Relative %: 50 %
Platelets: 192 10*3/uL (ref 150–400)
RBC: 3.87 MIL/uL — ABNORMAL LOW (ref 4.22–5.81)
RDW: 13.2 % (ref 11.5–15.5)
WBC: 8 10*3/uL (ref 4.0–10.5)
nRBC: 0 % (ref 0.0–0.2)

## 2021-06-20 LAB — BASIC METABOLIC PANEL
Anion gap: 9 (ref 5–15)
BUN: 16 mg/dL (ref 8–23)
CO2: 23 mmol/L (ref 22–32)
Calcium: 8.9 mg/dL (ref 8.9–10.3)
Chloride: 101 mmol/L (ref 98–111)
Creatinine, Ser: 0.84 mg/dL (ref 0.61–1.24)
GFR, Estimated: >60 mL/min (ref >60)
Glucose, Bld: 196 mg/dL — ABNORMAL HIGH (ref 70–99)
Potassium: 3.8 mmol/L (ref 3.5–5.1)
Sodium: 133 mmol/L — ABNORMAL LOW (ref 135–145)

## 2021-06-20 MED ORDER — IOHEXOL 350 MG/ML SOLN
75.0000 mL | Freq: Once | INTRAVENOUS | Status: AC | PRN
  Administered 2021-06-20: 75 mL via INTRAVENOUS

## 2021-06-20 MED ORDER — LORAZEPAM 2 MG/ML IJ SOLN
1.0000 mg | Freq: Once | INTRAMUSCULAR | Status: AC
  Administered 2021-06-20: 1 mg via INTRAVENOUS
  Filled 2021-06-20: qty 1

## 2021-06-20 NOTE — ED Provider Notes (Signed)
MRI of the brain is unremarkable for acute findings. ? ?CT angio showed disease vessels but no acute findings.  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 ? ?Case discussed with on-call neurology, recommending outpatient follow-up for these findings with no acute interventions. ? ?On my evaluation the patient has no focal deficit is awake and alert without any complaints.  Discharged home in stable condition to follow-up with his neurologist this week. ?  ?Luna Fuse, MD ?06/20/21 1710 ? ?

## 2021-06-20 NOTE — ED Notes (Signed)
Pt verbalized understanding of d/c instructions, meds, and followup care. Denies questions. VSS, no distress noted. Steady gait to exit with all belongings.  ?

## 2021-06-20 NOTE — ED Triage Notes (Signed)
Pt BIB [facility] from work for intermittent blurry vision. Hx CVA "several years ago," no CVA-like deficits, speech normal per family. GCS 15. VS 158/72 68HR 99%RA CBG 195. ? ?18 L FA ?

## 2021-06-20 NOTE — ED Provider Notes (Signed)
?[facility] ?Provider Note ? ? ?CSN: [id number] ?Arrival date & time: 06/20/21  1306 ? ?  ? ?History ? ?Chief Complaint  ?Patient presents with  ? Blurred Vision  ? ? ?Andrew Jones is a 81 y.o. male. ? ?Patient with some blurred vision that occurred just prior to arrival.  Had similar episode about a week or so ago that self resolved.  He denies vision loss, numbness, weakness.  Daughter states maybe she noticed some eyelid droop but was difficult to tell.  Patient has history of diabetes, seizure disorder, stroke.  On aspirin.  Has felt off balance at times and a little bit today but he also has been dealing with some right thigh issues thought to be secondary to his statin.  He denies any discomfort currently.  No slurred speech. ? ?The history is provided by the patient.  ?Neurologic Problem ?This is a new problem. The current episode started 1 to 2 hours ago. The problem occurs constantly. The problem has been resolved. Pertinent negatives include no chest pain, no abdominal pain, no headaches and no shortness of breath. Nothing aggravates the symptoms. Nothing relieves the symptoms. He has tried nothing for the symptoms. The treatment provided no relief.  ? ?  ? ?Home Medications ?Prior to Admission medications   ?Medication Sig Start Date End Date Taking? Authorizing Provider  ?amLODipine (NORVASC) 10 MG tablet Take 1 tablet (10 mg total) by mouth daily. 10/16/17   Nche, Charlene Brooke, NP  ?aspirin EC 81 MG tablet Take 1 tablet (81 mg total) by mouth daily. Swallow whole. 07/18/20   Garvin Fila, MD  ?benazepril (LOTENSIN) 10 MG tablet Take 10 mg by mouth daily.    [provider]  ?cholecalciferol (VITAMIN D) 1000 UNITS tablet Take 1,000 Units by mouth daily.    [provider]  ?diclofenac (VOLTAREN) 75 MG EC tablet Take 1 tablet (75 mg total) by mouth daily. 09/07/12   Biagio Borg, MD  ?etanercept (ENBREL) 50 MG/ML injection Inject 50 mg into the  skin once a week.    [provider]  ?fish oil-omega-3 fatty acids 1000 MG capsule Take 2 g by mouth daily.    [provider]  ?Magnesium Oxide 420 MG TABS Take 1 tablet (420 mg total) by mouth daily after breakfast. 10/16/17   Nche, Charlene Brooke, NP  ?metoprolol tartrate (LOPRESSOR) 25 MG tablet Take 25 mg by mouth 2 (two) times daily.    [provider]  ?Multiple Vitamin (MULTIVITAMIN) tablet Take 1 tablet by mouth daily.    [provider]  ?omeprazole (PRILOSEC) 20 MG capsule Take 20 mg by mouth daily.    [provider]  ?phenytoin (DILANTIN) 100 MG ER capsule Take 100 mg by mouth. 1 capsule twice daily into 2 months then 1 capsule daily into 2 months and then stop    [provider]  ?pioglitazone (ACTOS) 15 MG tablet Take 15 mg by mouth daily. 10/16/17   Nche, Charlene Brooke, NP  ?Vitamin D, Ergocalciferol, (DRISDOL) 1.25 MG (50000 UNIT) CAPS capsule TAKE 1 CAPSULE BY MOUTH EVERY 7 DAYS 02/21/21   Nche, Charlene Brooke, NP  ?   ? ?Allergies    ?Patient has no known allergies.   ? ?Review of Systems   ?Review of Systems  ?Respiratory:  Negative for shortness of breath.   ?Cardiovascular:  Negative for chest pain.  ?Gastrointestinal:  Negative for abdominal pain.  ?Neurological:  Negative for headaches.  ? ?  Physical Exam ?Updated Vital Signs ?BP (!) 141/63 (BP Location: Left Arm)   Pulse 77   Temp 97.7 ?F (36.5 ?C) (Oral)   Resp 18   Ht '5\' 8"'$  (1.727 m)   Wt 84 kg   SpO2 100%   BMI 28.16 kg/m?  ?Physical Exam ?Vitals and nursing note reviewed.  ?Constitutional:   ?   General: He is not in acute distress. ?   Appearance: He is well-developed. He is not ill-appearing.  ?HENT:  ?   Head: Normocephalic and atraumatic.  ?   Nose: Nose normal.  ?   Mouth/Throat:  ?   Mouth: Mucous membranes are moist.  ?Eyes:  ?   Extraocular Movements: Extraocular movements intact.  ?   Conjunctiva/sclera: Conjunctivae normal.  ?   Pupils: Pupils are equal, round, and  reactive to light.  ?Cardiovascular:  ?   Rate and Rhythm: Normal rate and regular rhythm.  ?   Pulses: Normal pulses.  ?   Heart sounds: Normal heart sounds. No murmur heard. ?Pulmonary:  ?   Effort: Pulmonary effort is normal. No respiratory distress.  ?   Breath sounds: Normal breath sounds.  ?Abdominal:  ?   Palpations: Abdomen is soft.  ?   Tenderness: There is no abdominal tenderness.  ?Musculoskeletal:     ?   General: No swelling or tenderness. Normal range of motion.  ?   Cervical back: Normal range of motion and neck supple.  ?Skin: ?   General: Skin is warm and dry.  ?   Capillary Refill: Capillary refill takes less than 2 seconds.  ?Neurological:  ?   General: No focal deficit present.  ?   Mental Status: He is alert and oriented to person, place, and time.  ?   Cranial Nerves: No cranial nerve deficit.  ?   Sensory: No sensory deficit.  ?   Motor: No weakness.  ?   Coordination: Coordination normal.  ?   Comments: 5+ out of 5 strength, normal sensation, normal visual fields, normal finger-nose-finger, normal speech, normal heel-to-shin  ?Psychiatric:     ?   Mood and Affect: Mood normal.  ? ? ?ED Results / Procedures / Treatments   ?Labs ?(all labs ordered are listed, but only abnormal results are displayed) ?Labs Reviewed  ?I-STAT CHEM 8, ED - Abnormal; Notable for the following components:  ?    Result Value  ? Sodium 133 (*)   ? Glucose, Bld 198 (*)   ? Calcium, Ion 1.08 (*)   ? TCO2 20 (*)   ? Hemoglobin 11.6 (*)   ? HCT 34.0 (*)   ? All other components within normal limits  ?CBC WITH DIFFERENTIAL/PLATELET  ?BASIC METABOLIC PANEL  ? ? ?EKG ?None ? ?Radiology ?No results found. ? ?Procedures ?Procedures  ? ? ?Medications Ordered in ED ?Medications - No data to display ? ?ED Course/ Medical Decision Making/ A&P ?  ?                        ?Medical Decision Making ?Amount and/or Complexity of Data Reviewed ?Labs: ordered. ?Radiology: ordered. ? ? ?JABARIE POP is here after episode of blurred vision.   History of diabetes, seizure, stroke on aspirin.  States he had an episode of blurred vision.  Not sure if he had double vision.  He has been having some balance issues recently secondary to some right lower leg issues.  Maybe that was worse today but  not having any issues now.  Denies vision loss.  No recent trauma.  Overall family concerned about possible stroke as may be there was some left eyelid droop.  Neurologically appears to be intact.  He has normal strength and sensation.  Normal visual acuity, visual fields, no blurred vision.  He has normal finger-nose-finger, normal heel-to-shin.  Overall atypical story for TIA or stroke but given his history shared decision was made to pursue a CTA of his head and neck and MRI to further evaluate for stroke or possibly some vascular disease.  This could also primarily be an ophthalmology issue as well.  We will check basic labs and images.  If unremarkable anticipate follow-up with ophthalmology and his neurologist.  Patient handed off to oncoming ED staff with patient pending this work-up.  Please see their note for further results, evaluation, disposition of patient. ? ?This chart was dictated using voice recognition software.  Despite best efforts to proofread,  errors can occur which can change the documentation meaning.  ? ? ? ? ? ? ? ?Final Clinical Impression(s) / ED Diagnoses ?Final diagnoses:  ?Blurred vision  ? ? ?Rx / DC Orders ?ED Discharge Orders   ? ? None  ? ?  ? ? ?  ?Lennice Sites, DO ?06/20/21 1420 ? ?

## 2021-06-25 LAB — BASIC METABOLIC PANEL
CO2: 26 — AB (ref 13–22)
Chloride: 105 (ref 99–108)
Creatinine: 1 (ref <=1.3)
Glucose: 200
Sodium: 135 — AB (ref 137–147)

## 2021-06-25 LAB — HEMOGLOBIN A1C: Hemoglobin A1C: 7.5

## 2021-06-25 LAB — HEPATIC FUNCTION PANEL
ALT: 45 U/L — AB (ref 10–40)
AST: 22 (ref 14–40)
Alkaline Phosphatase: 81 (ref 25–125)
Bilirubin, Direct: 0.1
Bilirubin, Total: 0.6

## 2021-06-25 LAB — COMPREHENSIVE METABOLIC PANEL
Albumin: 3.8 (ref 3.5–5.0)
Calcium: 9 (ref 8.7–10.7)

## 2021-06-25 LAB — LIPID PANEL
Cholesterol: 214 — AB (ref 0–200)
HDL: 46 (ref 35–70)
LDL Cholesterol: 114
Triglycerides: 269 — AB (ref 40–160)

## 2021-06-27 ENCOUNTER — Encounter: Payer: Self-pay | Admitting: Nurse Practitioner

## 2021-06-27 DIAGNOSIS — E559 Vitamin D deficiency, unspecified: Secondary | ICD-10-CM

## 2021-07-07 ENCOUNTER — Encounter: Payer: Self-pay | Admitting: Nurse Practitioner

## 2021-07-09 ENCOUNTER — Other Ambulatory Visit: Payer: Self-pay | Admitting: Nurse Practitioner

## 2021-07-09 DIAGNOSIS — E785 Hyperlipidemia, unspecified: Secondary | ICD-10-CM

## 2021-07-16 DIAGNOSIS — E119 Type 2 diabetes mellitus without complications: Secondary | ICD-10-CM | POA: Diagnosis not present

## 2021-07-16 LAB — HM DIABETES EYE EXAM

## 2021-07-25 ENCOUNTER — Ambulatory Visit (INDEPENDENT_AMBULATORY_CARE_PROVIDER_SITE_OTHER): Payer: Medicare Other | Admitting: Nurse Practitioner

## 2021-07-25 ENCOUNTER — Encounter: Payer: Self-pay | Admitting: Nurse Practitioner

## 2021-07-25 VITALS — BP 122/60 | HR 74 | Temp 97.0°F | Ht 67.0 in | Wt 195.6 lb

## 2021-07-25 DIAGNOSIS — M456 Ankylosing spondylitis lumbar region: Secondary | ICD-10-CM | POA: Diagnosis not present

## 2021-07-25 DIAGNOSIS — E1159 Type 2 diabetes mellitus with other circulatory complications: Secondary | ICD-10-CM

## 2021-07-25 DIAGNOSIS — E0841 Diabetes mellitus due to underlying condition with diabetic mononeuropathy: Secondary | ICD-10-CM | POA: Diagnosis not present

## 2021-07-25 DIAGNOSIS — G40909 Epilepsy, unspecified, not intractable, without status epilepticus: Secondary | ICD-10-CM

## 2021-07-25 DIAGNOSIS — E785 Hyperlipidemia, unspecified: Secondary | ICD-10-CM

## 2021-07-25 DIAGNOSIS — E559 Vitamin D deficiency, unspecified: Secondary | ICD-10-CM | POA: Diagnosis not present

## 2021-07-25 DIAGNOSIS — E1169 Type 2 diabetes mellitus with other specified complication: Secondary | ICD-10-CM

## 2021-07-25 MED ORDER — VITAMIN D3 125 MCG (5000 UT) PO CAPS: 5000.0000 [IU] | ORAL_CAPSULE | Freq: Every day | ORAL | 0 refills | Status: DC

## 2021-07-25 NOTE — Patient Instructions (Signed)
Maintain current medications ?Start vit. D 5000Iu daily. ?Request for Vit D level to be checked vis [facility] in July. ? ?Let me know if you need referral to the advanced lipid clinic. ? ?Sign medical release form to get records from Dr. Augusto Gamble. ? ?Diabetes Mellitus and Foot Care ?Foot care is an important part of your health, especially when you have diabetes. Diabetes may cause you to have problems because of poor blood flow (circulation) to your feet and legs, which can cause your skin to: ?Become thinner and drier. ?Break more easily. ?Heal more slowly. ?Peel and crack. ?You may also have nerve damage (neuropathy) in your legs and feet, causing decreased feeling in them. This means that you may not notice minor injuries to your feet that could lead to more serious problems. Noticing and addressing any potential problems early is the best way to prevent future foot problems. ?How to care for your feet ?Foot hygiene ? ?Wash your feet daily with warm water and mild soap. Do not use hot water. Then, pat your feet and the areas between your toes until they are completely dry. Do not soak your feet as this can dry your skin. ?Trim your toenails straight across. Do not dig under them or around the cuticle. File the edges of your nails with an emery board or nail file. ?Apply a moisturizing lotion or petroleum jelly to the skin on your feet and to dry, brittle toenails. Use lotion that does not contain alcohol and is unscented. Do not apply lotion between your toes. ?Shoes and socks ?Wear clean socks or stockings every day. Make sure they are not too tight. Do not wear knee-high stockings since they may decrease blood flow to your legs. ?Wear shoes that fit properly and have enough cushioning. Always look in your shoes before you put them on to be sure there are no objects inside. ?To break in new shoes, wear them for just a few hours a day. This prevents injuries on your feet. ?Wounds, scrapes, corns, and calluses ? ?Check  your feet daily for blisters, cuts, bruises, sores, and redness. If you cannot see the bottom of your feet, use a mirror or ask someone for help. ?Do not cut corns or calluses or try to remove them with medicine. ?If you find a minor scrape, cut, or break in the skin on your feet, keep it and the skin around it clean and dry. You may clean these areas with mild soap and water. Do not clean the area with peroxide, alcohol, or iodine. ?If you have a wound, scrape, corn, or callus on your foot, look at it several times a day to make sure it is healing and not infected. Check for: ?Redness, swelling, or pain. ?Fluid or blood. ?Warmth. ?Pus or a bad smell. ?General tips ?Do not cross your legs. This may decrease blood flow to your feet. ?Do not use heating pads or hot water bottles on your feet. They may burn your skin. If you have lost feeling in your feet or legs, you may not know this is happening until it is too late. ?Protect your feet from hot and cold by wearing shoes, such as at the beach or on hot pavement. ?Schedule a complete foot exam at least once a year (annually) or more often if you have foot problems. Report any cuts, sores, or bruises to your health care provider immediately. ?Where to find more information ?American Diabetes Association: www.diabetes.org ?Association of Diabetes Care & Education Specialists:  www.diabeteseducator.org ?Contact a health care provider if: ?You have a medical condition that increases your risk of infection and you have any cuts, sores, or bruises on your feet. ?You have an injury that is not healing. ?You have redness on your legs or feet. ?You feel burning or tingling in your legs or feet. ?You have pain or cramps in your legs and feet. ?Your legs or feet are numb. ?Your feet always feel cold. ?You have pain around any toenails. ?Get help right away if: ?You have a wound, scrape, corn, or callus on your foot and: ?You have pain, swelling, or redness that gets worse. ?You  have fluid or blood coming from the wound, scrape, corn, or callus. ?Your wound, scrape, corn, or callus feels warm to the touch. ?You have pus or a bad smell coming from the wound, scrape, corn, or callus. ?You have a fever. ?You have a red line going up your leg. ?Summary ?Check your feet every day for blisters, cuts, bruises, sores, and redness. ?Apply a moisturizing lotion or petroleum jelly to the skin on your feet and to dry, brittle toenails. ?Wear shoes that fit properly and have enough cushioning. ?If you have foot problems, report any cuts, sores, or bruises to your health care provider immediately. ?Schedule a complete foot exam at least once a year (annually) or more often if you have foot problems. ?This information is not intended to replace advice given to you by your health care provider. Make sure you discuss any questions you have with your health care provider. ?Document Revised: 09/30/2019 Document Reviewed: 09/30/2019 ?Elsevier Patient Education ? [place]. ? ?

## 2021-07-25 NOTE — Assessment & Plan Note (Addendum)
Chronic joint and back pain. ?Managed with tylenol extra strength 2tabs daily. ?Managed by [facility] rheumatologist ?Current use of embrel injection ?Stable CBC and CMP completed by [facility] clinic ?

## 2021-07-25 NOTE — Assessment & Plan Note (Addendum)
Unable to tolerate crestor even at a low dose or taken every other day. )proximal muscle weakness and pain) ?Lipid panel completed 06/25/2021: TC 214, trig 269, LDL 114, HDL 46. ?Started zetia '10mg'$  3weeks ago. Denies any adverse effects thus far. ?He is to repeat lipid panel 09/2021 via [place] clinic ?Plan to start repatha injections if no improvement. ?

## 2021-07-25 NOTE — Assessment & Plan Note (Signed)
Abnormal foot exam: diminished vibration and microfilament sensation ?No ulcers, no callus. ?Advised about importance of proper fitting foot wear at all times and daily visual skin checks. ?

## 2021-07-25 NOTE — Assessment & Plan Note (Signed)
No seizure activity in last 62month ?Current use of dilantin. ?Under the care of Dr. Sethi-neurology ?

## 2021-07-25 NOTE — Assessment & Plan Note (Signed)
hgbA1c at 7.5% ?Metformin dose increased to '1000mg'$  BID per [facility] provider. ?Annual eye exam completed by Dr. Augusto Gamble (report requested). ?Up to date with urine microalbumin ?Neuropathy present and stable. No need for gabapentin at this time. ?

## 2021-07-25 NOTE — Progress Notes (Signed)
? ?             Established Patient Visit ? ?Patient: Andrew Jones   DOB: 1940-04-01   81 y.o. Male  MRN: [id number] ?Visit Date: 07/25/2021 ? ?Subjective:  ?  ?Chief Complaint  ?Patient presents with  ? Follow-up  ?  Pt would like to discuss medication and possible changes. [facility] and he would just like to discuss this with his PCP first.   ? ?HPI ?Diabetic mononeuropathy associated with diabetes mellitus due to underlying condition ([place]) ?Abnormal foot exam: diminished vibration and microfilament sensation ?No ulcers, no callus. ?Advised about importance of proper fitting foot wear at all times and daily visual skin checks. ? ?Diabetes mellitus, type 2 ?hgbA1c at 7.5% ?Metformin dose increased to '1000mg'$  BID per [facility] provider. ?Annual eye exam completed by Dr. Augusto Gamble (report requested). ?Up to date with urine microalbumin ?Neuropathy present and stable. No need for gabapentin at this time. ? ?Ankylosing spondylitis of lumbar region ?Chronic joint and back pain. ?Managed with tylenol extra strength 2tabs daily. ?Managed by [facility] rheumatologist ?Current use of embrel injection ?Stable CBC and CMP completed by [facility] ? ?Seizure disorder ?No seizure activity in last 58month ?Current use of dilantin. ?Under the care of Dr. Sethi-neurology ? ?Hyperlipidemia associated with type 2 diabetes mellitus ([place] ?Unable to tolerate crestor even at a low dose or taken every other day. )proximal muscle weakness and pain) ?Lipid panel completed 06/25/2021: TC 214, trig 269, LDL 114, HDL 46. ?Started zetia '10mg'$  3weeks ago. Denies any adverse effects thus far. ?[facility] ?Plan to start repatha injections if no improvement. ? ?Vitamin D deficiency ?Complete 12weeks of 50000Iu weekly ?Switched to 5000IU daily till upcoming blood draw in July ? ?BP Readings from Last 3 Encounters:  ?07/25/21 122/60  ?06/20/21 134/85  ?04/19/21 (!) 147/81  ?  ?Wt Readings  from Last 3 Encounters:  ?07/25/21 195 lb 9.6 oz (88.7 kg)  ?06/20/21 185 lb 3 oz (84 kg)  ?04/19/21 186 lb (84.4 kg)  ?  ?Reviewed medical, surgical, and social history today ? ?Medications: ?Outpatient Medications Prior to Visit  ?Medication Sig  ? amLODipine (NORVASC) 10 MG tablet Take 1 tablet (10 mg total) by mouth daily.  ? aspirin EC 81 MG tablet Take 1 tablet (81 mg total) by mouth daily. Swallow whole.  ? benazepril (LOTENSIN) 10 MG tablet Take 10 mg by mouth daily.  ? diclofenac (VOLTAREN) 75 MG EC tablet Take 1 tablet (75 mg total) by mouth daily.  ? etanercept (ENBREL) 50 MG/ML injection Inject 50 mg into the skin once a week.  ? ezetimibe (ZETIA) 10 MG tablet Take 1 tablet (10 mg total) by mouth daily.  ? fish oil-omega-3 fatty acids 1000 MG capsule Take 2 g by mouth daily.  ? Magnesium Oxide 420 MG TABS Take 1 tablet (420 mg total) by mouth daily after breakfast.  ? metFORMIN (GLUCOPHAGE) 1000 MG tablet Take 1,000 mg by mouth 2 (two) times daily with a meal.  ? metoprolol tartrate (LOPRESSOR) 25 MG tablet Take 25 mg by mouth 2 (two) times daily.  ? Multiple Vitamin (MULTIVITAMIN) tablet Take 1 tablet by mouth daily.  ? omeprazole (PRILOSEC) 20 MG capsule Take 20 mg by mouth daily.  ? phenytoin (DILANTIN) 100 MG ER capsule Take 100 mg by mouth. 1 capsule twice daily into 2 months then 1 capsule daily into 2 months and then  stop  ? RESTASIS 0.05 % ophthalmic emulsion Place 1 drop into both eyes 2 (two) times daily.  ? [DISCONTINUED] cholecalciferol (VITAMIN D) 1000 UNITS tablet Take 1,000 Units by mouth daily.  ? [DISCONTINUED] Vitamin D, Ergocalciferol, (DRISDOL) 1.25 MG (50000 UNIT) CAPS capsule TAKE 1 CAPSULE BY MOUTH EVERY 7 DAYS  ? [DISCONTINUED] pioglitazone (ACTOS) 15 MG tablet Take 15 mg by mouth daily. (Patient not taking: Reported on 07/25/2021)  ? ?No facility-administered medications prior to visit.  ? ?Reviewed past medical and social history.  ? ?ROS per HPI above ? ?  ?   ?Objective:  ?BP  122/60 (BP Location: Left Arm, Patient Position: Sitting, Cuff Size: Large)   Pulse 74   Temp (!) 97 ?F (36.1 ?C) (Temporal)   Ht '5\' 7"'$  (1.702 m)   Wt 195 lb 9.6 oz (88.7 kg)   SpO2 98%   BMI 30.64 kg/m?  ? ?  ? ?Physical Exam ?Vitals reviewed.  ?Cardiovascular:  ?   Rate and Rhythm: Normal rate and regular rhythm.  ?   Pulses: Normal pulses.  ?   Heart sounds: Normal heart sounds.  ?Pulmonary:  ?   Effort: Pulmonary effort is normal.  ?   Breath sounds: Normal breath sounds.  ?Musculoskeletal:  ?   Right lower leg: No edema.  ?   Left lower leg: No edema.  ?Neurological:  ?   Mental Status: He is alert and oriented to person, place, and time.  ?Psychiatric:     ?   Mood and Affect: Mood normal.     ?   Behavior: Behavior normal.     ?   Thought Content: Thought content normal.  ?  ?Results for orders placed or performed in visit on 07/25/21  ?Comprehensive metabolic panel  ?Result Value Ref Range  ? Albumin 3.8 3.5 - 5.0  ?Lipid panel  ?Result Value Ref Range  ? Triglycerides 269 (A) 40 - 160  ? Cholesterol 214 (A) 0 - 200  ? HDL 46 35 - 70  ? LDL Cholesterol 114   ?Hepatic function panel  ?Result Value Ref Range  ? Alkaline Phosphatase 81 25 - 125  ? ALT 45 (A) 10 - 40 U/L  ? AST 22 14 - 40  ? Bilirubin, Direct 0.1 0.01 - 0.4  ? Bilirubin, Total 0.6   ?Hemoglobin A1c  ?Result Value Ref Range  ? Hemoglobin A1C 7.5   ?Basic metabolic panel  ?Result Value Ref Range  ? Glucose 200   ? CO2 26 (A) 13 - 22  ? Creatinine 1.0 0.6 - 1.3  ? Sodium 135 (A) 137 - 147  ? Chloride 105 99 - 108  ?Comprehensive metabolic panel  ?Result Value Ref Range  ? Calcium 9.0 8.7 - 10.7  ? ?   ?Assessment & Plan:  ?  ?Problem List Items Addressed This Visit   ? ?  ? Endocrine  ? Diabetes mellitus, type 2 ([place])  ?  hgbA1c at 7.5% ?Metformin dose increased to '1000mg'$  BID per [facility] provider. ?Annual eye exam completed by Dr. Augusto Gamble (report requested). ?Up to date with urine microalbumin ?Neuropathy present and stable. No need for gabapentin at  this time. ? ?  ?  ? Relevant Medications  ? metFORMIN (GLUCOPHAGE) 1000 MG tablet  ? Diabetic mononeuropathy associated with diabetes mellitus due to underlying condition ([place]) - Primary  ?  Abnormal foot exam: diminished vibration and microfilament sensation ?No ulcers, no callus. ?Advised about importance of proper fitting foot  wear at all times and daily visual skin checks. ? ?  ?  ? Relevant Medications  ? metFORMIN (GLUCOPHAGE) 1000 MG tablet  ? Hyperlipidemia associated with type 2 diabetes mellitus ([place])  ?  Unable to tolerate crestor even at a low dose or taken every other day. )proximal muscle weakness and pain) ?Lipid panel completed 06/25/2021: TC 214, trig 269, LDL 114, HDL 46. ?Started zetia '10mg'$  3weeks ago. Denies any adverse effects thus far. ?He is to repeat lipid panel 09/2021 via [place] clinic ?Plan to start repatha injections if no improvement. ?  ?  ? Relevant Medications  ? metFORMIN (GLUCOPHAGE) 1000 MG tablet  ?  ? Nervous and Auditory  ? Seizure disorder ([place])  ?  No seizure activity in last 95month ?Current use of dilantin. ?Under the care of Dr. Sethi-neurology ? ?  ?  ?  ? Musculoskeletal and Integument  ? 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[facility]  ?  Chronic joint and back pain. ?Managed with tylenol extra strength 2tabs daily. ?[facility] ?Current use of embrel injection ?[facility] ? ?  ?  ?  ? Other  ? Vitamin D deficiency  ?  Complete 12weeks of 50000Iu weekly ?Switched to 5000IU daily till upcoming blood draw in July ? ?  ?  ? Relevant Medications  ? Cholecalciferol (VITAMIN D3) 125 MCG (5000 UT) CAPS  ? ?Return in about 6 months (around 01/25/2022) for DM and HTN, hyperlipidemia (fasting). ? ?  ? ?CWilfred Lacy NP ? ? ?

## 2021-07-25 NOTE — Assessment & Plan Note (Signed)
Complete 12weeks of 50000Iu weekly ?Switched to 5000IU daily till upcoming blood draw in July ?

## 2021-08-10 ENCOUNTER — Telehealth: Payer: Self-pay | Admitting: Nurse Practitioner

## 2021-08-10 NOTE — Telephone Encounter (Signed)
Completed form but needs nurse visit for PPD skin test. Attach med and problem list to form.

## 2021-08-10 NOTE — Telephone Encounter (Signed)
Appt scheduled

## 2021-08-13 DIAGNOSIS — H26491 Other secondary cataract, right eye: Secondary | ICD-10-CM | POA: Diagnosis not present

## 2021-08-13 DIAGNOSIS — H04123 Dry eye syndrome of bilateral lacrimal glands: Secondary | ICD-10-CM | POA: Diagnosis not present

## 2021-08-13 DIAGNOSIS — E119 Type 2 diabetes mellitus without complications: Secondary | ICD-10-CM | POA: Diagnosis not present

## 2021-08-13 DIAGNOSIS — H18413 Arcus senilis, bilateral: Secondary | ICD-10-CM | POA: Diagnosis not present

## 2021-08-14 ENCOUNTER — Ambulatory Visit: Payer: Medicare Other

## 2021-08-14 DIAGNOSIS — Z111 Encounter for screening for respiratory tuberculosis: Secondary | ICD-10-CM | POA: Diagnosis not present

## 2021-08-14 NOTE — Progress Notes (Signed)
Patient is here for TB skin test. TB skin test administered in left forearm. Advised patient to return on 05/25 after 02:20 pm or on 05/26 before 02:20 pm. Patient verbalized understanding.

## 2021-08-16 ENCOUNTER — Ambulatory Visit (INDEPENDENT_AMBULATORY_CARE_PROVIDER_SITE_OTHER): Payer: Medicare Other

## 2021-08-16 DIAGNOSIS — Z111 Encounter for screening for respiratory tuberculosis: Secondary | ICD-10-CM

## 2021-08-16 LAB — TB SKIN TEST
Induration: 0 mm
TB Skin Test: NEGATIVE

## 2021-08-16 NOTE — Progress Notes (Signed)
PPD Reading Note PPD read and results entered in [place]. Result: 0 mm induration. Interpretation: Negative If test not read within 48-72 hours of initial placement, patient advised to repeat in other arm 1-3 weeks after this test. Allergic reaction: no  Stasia Cavalier, RMA

## 2021-08-22 DIAGNOSIS — H26492 Other secondary cataract, left eye: Secondary | ICD-10-CM | POA: Diagnosis not present

## 2021-08-22 DIAGNOSIS — Z9841 Cataract extraction status, right eye: Secondary | ICD-10-CM | POA: Diagnosis not present

## 2021-08-22 DIAGNOSIS — Z961 Presence of intraocular lens: Secondary | ICD-10-CM | POA: Diagnosis not present

## 2021-08-22 LAB — HM DIABETES EYE EXAM

## 2021-10-20 ENCOUNTER — Other Ambulatory Visit: Payer: Self-pay | Admitting: Nurse Practitioner

## 2021-10-20 ENCOUNTER — Encounter: Payer: Self-pay | Admitting: Nurse Practitioner

## 2021-10-20 DIAGNOSIS — E1169 Type 2 diabetes mellitus with other specified complication: Secondary | ICD-10-CM

## 2021-10-20 DIAGNOSIS — E559 Vitamin D deficiency, unspecified: Secondary | ICD-10-CM

## 2021-10-20 DIAGNOSIS — E1159 Type 2 diabetes mellitus with other circulatory complications: Secondary | ICD-10-CM

## 2021-10-22 MED ORDER — VITAMIN D3 125 MCG (5000 UT) PO CAPS: 5000.0000 [IU] | ORAL_CAPSULE | Freq: Every day | ORAL | 1 refills | Status: DC

## 2021-10-22 NOTE — Telephone Encounter (Signed)
Chart supports Rx Last OV: 07/2021 Next OV: not scheduled   

## 2021-11-19 NOTE — Progress Notes (Unsigned)
    Andrew Jones D.[place] Phone: [phone]   Assessment and Plan:     There are no diagnoses linked to this encounter.  ***   Pertinent previous records reviewed include ***   Follow Up: ***     Subjective:   I, Gisel Vipond, am serving as a Education administrator for Doctor Glennon Mac  Chief Complaint: low back pain   HPI:   11/20/2021 Patient is a 81 year old male complaining of low back pain. Patient states   Relevant Historical Information: ***  Additional pertinent review of systems negative.   Current Outpatient Medications:    Alirocumab 75 MG/ML SOAJ, Inject 75 mg into the skin every 14 (fourteen) days., Disp: 2 mL, Rfl:    amLODipine (NORVASC) 10 MG tablet, Take 1 tablet (10 mg total) by mouth daily., Disp: , Rfl:    aspirin EC 81 MG tablet, Take 1 tablet (81 mg total) by mouth daily. Swallow whole., Disp: 30 tablet, Rfl: 11   benazepril (LOTENSIN) 10 MG tablet, Take 10 mg by mouth daily., Disp: , Rfl:    Cholecalciferol (VITAMIN D3) 125 MCG (5000 UT) CAPS, Take 1 capsule (5,000 Units total) by mouth daily., Disp: 90 capsule, Rfl: 1   diclofenac (VOLTAREN) 75 MG EC tablet, Take 1 tablet (75 mg total) by mouth daily., Disp: 30 tablet, Rfl: 5   empagliflozin (JARDIANCE) 25 MG TABS tablet, Take 1 tablet (25 mg total) by mouth daily., Disp: 30 tablet, Rfl:    etanercept (ENBREL) 50 MG/ML injection, Inject 50 mg into the skin once a week., Disp: , Rfl:    ezetimibe (ZETIA) 10 MG tablet, Take 1 tablet (10 mg total) by mouth daily., Disp: 90 tablet, Rfl: 3   fish oil-omega-3 fatty acids 1000 MG capsule, Take 2 g by mouth daily., Disp: , Rfl:    Magnesium Oxide 420 MG TABS, Take 1 tablet (420 mg total) by mouth daily after breakfast., Disp: , Rfl: 0   metFORMIN (GLUCOPHAGE) 1000 MG tablet, Take 1,000 mg by mouth 2 (two) times daily with a meal., Disp: , Rfl:    metoprolol tartrate (LOPRESSOR) 25 MG  tablet, Take 25 mg by mouth 2 (two) times daily., Disp: , Rfl:    Multiple Vitamin (MULTIVITAMIN) tablet, Take 1 tablet by mouth daily., Disp: , Rfl:    omeprazole (PRILOSEC) 20 MG capsule, Take 20 mg by mouth daily., Disp: , Rfl:    RESTASIS 0.05 % ophthalmic emulsion, Place 1 drop into both eyes 2 (two) times daily., Disp: , Rfl:    Objective:     There were no vitals filed for this visit.    There is no height or weight on file to calculate BMI.    Physical Exam:    ***   Electronically signed by:  Andrew Jones D.[facility] 12:53 PM 11/19/21

## 2021-11-20 ENCOUNTER — Ambulatory Visit (INDEPENDENT_AMBULATORY_CARE_PROVIDER_SITE_OTHER): Payer: Medicare Other

## 2021-11-20 ENCOUNTER — Ambulatory Visit: Payer: Medicare Other | Admitting: Sports Medicine

## 2021-11-20 VITALS — BP 122/70 | HR 73 | Ht 67.0 in | Wt 195.0 lb

## 2021-11-20 DIAGNOSIS — M545 Low back pain, unspecified: Secondary | ICD-10-CM

## 2021-11-20 DIAGNOSIS — R29898 Other symptoms and signs involving the musculoskeletal system: Secondary | ICD-10-CM | POA: Diagnosis not present

## 2021-11-20 DIAGNOSIS — G8929 Other chronic pain: Secondary | ICD-10-CM

## 2021-11-20 DIAGNOSIS — F40240 Claustrophobia: Secondary | ICD-10-CM

## 2021-11-20 DIAGNOSIS — M456 Ankylosing spondylitis lumbar region: Secondary | ICD-10-CM

## 2021-11-20 DIAGNOSIS — M5136 Other intervertebral disc degeneration, lumbar region: Secondary | ICD-10-CM | POA: Diagnosis not present

## 2021-11-20 MED ORDER — LORAZEPAM 0.5 MG PO TABS: ORAL_TABLET | ORAL | 0 refills | Status: DC

## 2021-11-20 NOTE — Patient Instructions (Addendum)
Good to see you MRI referral  PT referral  Continue Enbrel medication and tylenol as needed for pain  Ativan has been placed  Follow up 3 days after your MRI to discuss your results

## 2021-11-28 NOTE — Therapy (Signed)
OUTPATIENT PHYSICAL THERAPY THORACOLUMBAR EVALUATION   Patient Name: KRRISH FREUND MRN: [id number] DOB:Dec 26, 1940, 81 y.o., male Today's Date: 11/29/2021   PT End of Session - 11/29/21 1157     Visit Number 1    Number of Visits 9    Date for PT Re-Evaluation 01/31/22    Authorization Type BCBS    Authorization Time Period FOTO v6, v10, kx mod v15    Progress Note Due on Visit 10    PT Start Time 1125    PT Stop Time 1200    PT Time Calculation (min) 35 min    Activity Tolerance Patient tolerated treatment well    Behavior During Therapy WFL for tasks assessed/performed             Past Medical History:  Diagnosis Date   Ankylosing spondylitis ([place])    back, hips - on embrel 2000-2008, resumed 12/2012   CVA (cerebral vascular accident) ([place]) 1999, 2002   no residual problems   Diabetes mellitus, type 2 ([place])    Dyslipidemia    Hypertension    Osteoarthritis    Seizure disorder ([place]) 05/2009   Sleep apnea    uses c-pap   Past Surgical History:  Procedure Laterality Date   NO PAST SURGERIES     Patient Active Problem List   Diagnosis Date Noted   Diabetic mononeuropathy associated with diabetes mellitus due to underlying condition ([place]) 07/25/2021   Vitamin D deficiency 07/25/2021   Obesity 07/03/2020   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   Chronic kidney disease, stage III (moderate) ([place]) 07/03/2020   Low magnesium level 04/29/2019   Seborrheic keratosis 05/15/2018   Hearing loss 12/01/2017   BPH (benign prostatic hypertrophy) 12/20/2014   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[place]) 12/20/2014   PSA elevation 12/20/2014   Encounter for therapeutic drug monitoring 12/08/2012   Diabetes mellitus, type 2 ([place])    Seizure disorder ([facility])    Hypertension    Hyperlipidemia associated with type 2 diabetes mellitus ([place])    CVA (cerebral vascular accident) ([place])    Osteoarthritis     PCP: Glennon Mac, DO  REFERRING  PROVIDER: Glennon Mac, DO  REFERRING DIAG:  [phone] (ICD-10-CM) - Chronic bilateral low back pain without sciatica  M45.6 (ICD-10-CM) - Ankylosing spondylitis of lumbar region [facility])    Rationale for Evaluation and Treatment Rehabilitation  THERAPY DIAG:  Other low back pain  Muscle weakness (generalized)  Difficulty in walking, not elsewhere classified  ONSET DATE: 23 years ago  SUBJECTIVE:  SUBJECTIVE STATEMENT: Pt reports primary c/o BIL LE fatigue and weakness, along with LBP related to ankylosing spondylitis (AS) which was diagnosed in 2000. He reports that in April of 2022, his fatigue was exacerbated to the point of not being able to stand/ walk. Prior to this, he enjoyed walking 2 miles per day with his wife. Pt denies any N/T related to this problem, although he endorses a hx of diabetic neuropathy in BIL hands and feet. Pt reports current pain of 0/10.  Worst pain is 4-5/10. Aggravating factors include standing after prolonged sitting, walking >162f (2-3 minutes). Easing factors include Diclofenac and pain cream.   PERTINENT HISTORY:  Ankylosing Spondylitis since 2000, hx of CVA x2 in 1999 and 2002, hx of grand mal seizure in 2011, DMII, HTN  PAIN:  Are you having pain? Yes: NPRS scale: 0/10 Pain location: low back Pain description: achy, stiff Aggravating factors: standing after prolonged sitting, walking >1026f(2-3 minutes) Relieving factors: Diclofenac and pain cream   PRECAUTIONS: Other: Avoid OMT, forceful trunk extension  WEIGHT BEARING RESTRICTIONS No  FALLS:  Has patient fallen in last 6 months? No  LIVING ENVIRONMENT: Lives with: lives with their spouse Lives in: House/apartment Stairs: No Has following equipment at home: WaEnvironmental consultant 4 wheeled, ShElectronics engineerand  Grab bars  OCCUPATION: Retired  PLOF: Independent  PATIENT GOALS Improve LE strength, be able to walk to grandson's games   OBJECTIVE:   DIAGNOSTIC FINDINGS:  11/20/2021: DG Lumbar Spine 2-3 Views: IMPRESSION: 1. Severe degenerative changes of the lumbar spine appears similar to the prior study. 2. 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  PATIENT SURVEYS:  FOTO 49%, predicted 58% in 11 visits  SCREENING FOR RED FLAGS: Bowel or bladder incontinence: No Cauda equina syndrome: No  COGNITION:  Overall cognitive status: Within functional limits for tasks assessed     SENSATION: Not tested  MUSCLE LENGTH: Hamstring 90/90: Right 40 deg shy of full extension; Left 30 deg shy of full extension Thomas test: Moderate tightness BIL  POSTURE: rounded shoulders, forward head, decreased lumbar lordosis, and flexed trunk   PALPATION: No TTP in thoracic or lumbar paraspinals  PASSIVE ACCESSORY MOBILITY: Hypomobile CPAs throughout thoracic and lumbar spine  LUMBAR ROM:   Active  AROM  eval  Flexion 75%  Extension 25%  Right lateral flexion 50%  Left lateral flexion 50%  Right rotation 75%  Left rotation 75%   (Blank rows = not tested)   LOWER EXTREMITY MMT:    MMT Right eval Left eval  Hip flexion 4/5 3+/5  Hip extension 3/5 3/5  Hip abduction 3+/5 3+/5  Knee flexion 5/5 5/5  Knee extension 5/5 5/5  Ankle dorsiflexion 5/5 5/5  Ankle plantarflexion 5/5 5/5   (Blank rows = not tested)   FUNCTIONAL TESTS:  Squat: WNL Plank: 48 seconds, terminated due to shoulder fatigue 5xSTS: 11 seconds  GAIT: Distance walked: 20 ft Assistive device utilized: None Level of assistance: Complete Independence Comments: Short stride length and forward flexed trunk    TODAY'S TREATMENT  11/29/2021: Demonstrated and issued HEP   PATIENT EDUCATION:  Education details: Pt educated on probable underlying pathophysiology, POC, prognosis, FOTO,  activity modification for AS, and HEP Person educated: Patient Education method: Explanation, Demonstration, and Handouts Education comprehension: verbalized understanding and returned demonstration   HOME EXERCISE PROGRAM: Access Code: [email or web address]: [email or web address] Date: 11/29/2021 Prepared by: TuVanessa DurhamExercises - Sidelying Open Book Thoracic Lumbar Rotation and Extension  - 1  x daily - 7 x weekly - 2 sets - 10 reps - 5 seconds hold - Supine Bridge  - 1 x daily - 7 x weekly - 3 sets - 10 reps - 3 seconds hold - Supine 90/90 Abdominal Bracing *with handhold resistance to thighs*  - 1 x daily - 7 x weekly - 3 sets - 30 seconds hold - Sidelying Hip Abduction  - 1 x daily - 7 x weekly - 3 sets - 10 reps - 3 seconds hold - Modified Thomas Stretch  - 1 x daily - 7 x weekly - 2 sets - 1 minute hold  ASSESSMENT:  CLINICAL IMPRESSION: Patient is a 81 y.o. M who was seen today for physical therapy evaluation and treatment for chronic LBP and LE muscle weakness related to chronic Ankylosing Spondylitis (AS). Upon assessment, his primary impairments include limited global trunk AROM with most limitation in BIL side bend and trunk extension, weak global hip strength, weak functional core strength, and tight BIL hamstring and hip flexors. He will benefit from skilled PT to address his primary impairments and return to his prior level of function with less limitation.   OBJECTIVE IMPAIRMENTS Abnormal gait, decreased activity tolerance, decreased balance, decreased endurance, decreased knowledge of condition, decreased mobility, difficulty walking, decreased ROM, decreased strength, hypomobility, impaired flexibility, improper body mechanics, postural dysfunction, and pain.   ACTIVITY LIMITATIONS carrying, lifting, bending, sitting, standing, squatting, sleeping, stairs, transfers, and locomotion level  PARTICIPATION LIMITATIONS: cleaning, laundry, driving, shopping,  community activity, and yard work  PERSONAL FACTORS Age, Past/current experiences, Time since onset of injury/illness/exacerbation, and 3+ comorbidities: See medical hx  are also affecting patient's functional outcome.   REHAB POTENTIAL: Fair Due to advanced AS presentation, multiple comorbidities  CLINICAL DECISION MAKING: Stable/uncomplicated  EVALUATION COMPLEXITY: Low   GOALS: Goals reviewed with patient? Yes  SHORT TERM GOALS: Target date: 12/27/2021  Pt will report understanding and adherence to initial HEP in order to promote independence in the management of primary impairments. Baseline: HEP provided at eval Goal status: INITIAL   LONG TERM GOALS: Target date: 01/24/2022  Pt will achieve a FOTO score of 58% in order to demonstrate improved functional ability as it relates to his primary impairments. Baseline: 49% Goal status: INITIAL  2.  Pt will reports the ability to walk 10 minutes without an AD in order to walk from the parking lot to his grandson's sports field with less limitation. Baseline: 2-3 minutes before requiring a seated rest Goal status: INITIAL  3.  Pt will achieve global BIL hip strength of 4+/5 in order to progress his independent exercise regimen with less limitation. Baseline: See MMT chart Goal status: INITIAL  4.  Pt will demonstrate understanding of management of AS including activity modification and appropriate exercises to progress his strength/ mobility. Baseline: Initial education at eval Goal status: INITIAL    PLAN: PT FREQUENCY: 1x/week  PT DURATION: 8 weeks  PLANNED INTERVENTIONS: Therapeutic exercises, Therapeutic activity, Neuromuscular re-education, Balance training, Gait training, Patient/Family education, Self Care, Joint mobilization, Stair training, Orthotic/Fit training, Aquatic Therapy, Dry Needling, Electrical stimulation, Spinal mobilization, Cryotherapy, Moist heat, Taping, Biofeedback, Ionotophoresis '4mg'$ /ml  Dexamethasone, Manual therapy, and Re-evaluation.  PLAN FOR NEXT SESSION: Progress gentle mobility/ stretching, early strengthening, further education concerning activity modification for AS   Vanessa Maxwell, PT, DPT 11/29/21 12:13 PM

## 2021-11-29 ENCOUNTER — Other Ambulatory Visit: Payer: Self-pay

## 2021-11-29 ENCOUNTER — Ambulatory Visit: Payer: Medicare Other | Attending: Sports Medicine

## 2021-11-29 DIAGNOSIS — R262 Difficulty in walking, not elsewhere classified: Secondary | ICD-10-CM | POA: Insufficient documentation

## 2021-11-29 DIAGNOSIS — M5459 Other low back pain: Secondary | ICD-10-CM | POA: Insufficient documentation

## 2021-11-29 DIAGNOSIS — M6281 Muscle weakness (generalized): Secondary | ICD-10-CM | POA: Diagnosis not present

## 2021-11-29 DIAGNOSIS — M456 Ankylosing spondylitis lumbar region: Secondary | ICD-10-CM | POA: Insufficient documentation

## 2021-11-29 DIAGNOSIS — G8929 Other chronic pain: Secondary | ICD-10-CM | POA: Insufficient documentation

## 2021-11-29 DIAGNOSIS — M545 Low back pain, unspecified: Secondary | ICD-10-CM | POA: Insufficient documentation

## 2021-12-04 ENCOUNTER — Ambulatory Visit: Payer: Medicare Other

## 2021-12-04 DIAGNOSIS — M5459 Other low back pain: Secondary | ICD-10-CM | POA: Diagnosis not present

## 2021-12-04 DIAGNOSIS — R262 Difficulty in walking, not elsewhere classified: Secondary | ICD-10-CM

## 2021-12-04 DIAGNOSIS — M545 Low back pain, unspecified: Secondary | ICD-10-CM | POA: Diagnosis not present

## 2021-12-04 DIAGNOSIS — M6281 Muscle weakness (generalized): Secondary | ICD-10-CM

## 2021-12-04 DIAGNOSIS — G8929 Other chronic pain: Secondary | ICD-10-CM | POA: Diagnosis not present

## 2021-12-04 DIAGNOSIS — M456 Ankylosing spondylitis lumbar region: Secondary | ICD-10-CM | POA: Diagnosis not present

## 2021-12-04 NOTE — Therapy (Signed)
OUTPATIENT PHYSICAL THERAPY TREATMENT NOTE   Patient Name: Andrew Jones MRN: [id number] DOB:13-Jan-1941, 81 y.o., male Today's Date: 12/04/2021  PCP: Glennon Mac, DO REFERRING PROVIDER: Glennon Mac, DO  END OF SESSION:   PT End of Session - 12/04/21 1213     Visit Number 2    Number of Visits 9    Date for PT Re-Evaluation 01/31/22    Authorization Type BCBS    Authorization Time Period FOTO v6, v10, kx mod v15    Progress Note Due on Visit 10    PT Start Time 1215    PT Stop Time 1255    PT Time Calculation (min) 40 min    Activity Tolerance Patient tolerated treatment well    Behavior During Therapy [facility] for tasks assessed/performed             Past Medical History:  Diagnosis Date   Ankylosing spondylitis ([place])    back, hips - on embrel 2000-2008, resumed 12/2012   CVA (cerebral vascular accident) ([place]) 1999, 2002   no residual problems   Diabetes mellitus, type 2 ([place])    Dyslipidemia    Hypertension    Osteoarthritis    Seizure disorder ([place]) 05/2009   Sleep apnea    uses c-pap   Past Surgical History:  Procedure Laterality Date   NO PAST SURGERIES     Patient Active Problem List   Diagnosis Date Noted   Diabetic mononeuropathy associated with diabetes mellitus due to underlying condition ([place]) 07/25/2021   Vitamin D deficiency 07/25/2021   Obesity 07/03/2020   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   Chronic kidney disease, stage III (moderate) ([place]) 07/03/2020   Low magnesium level 04/29/2019   Seborrheic keratosis 05/15/2018   Hearing loss 12/01/2017   BPH (benign prostatic hypertrophy) 12/20/2014   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[place]) 12/20/2014   PSA elevation 12/20/2014   Encounter for therapeutic drug monitoring 12/08/2012   Diabetes mellitus, type 2 ([place])    Seizure disorder ([facility])    Hypertension    Hyperlipidemia associated with type 2 diabetes mellitus ([place])    CVA (cerebral vascular  accident) ([place])    Osteoarthritis     REFERRING DIAG:  M54.50,G89.29 (ICD-10-CM) - Chronic bilateral low back pain without sciatica  M45.6 (ICD-10-CM) - 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[place])    THERAPY DIAG:  Other low back pain  Muscle weakness (generalized)  Difficulty in walking, not elsewhere classified  Rationale for Evaluation and Treatment Rehabilitation  PERTINENT HISTORY: Ankylosing Spondylitis since 2000, hx of CVA x2 in 1999 and 2002, hx of grand mal seizure in 2011, DMII, HTN  PRECAUTIONS: Other: Avoid OMT, forceful trunk extension  SUBJECTIVE: Patient reports no current pain and HEP compliance.  PAIN:  Are you having pain? Yes: NPRS scale: 0/10 Pain location: low back Pain description: achy, stiff Aggravating factors: standing after prolonged sitting, walking >183f (2-3 minutes) Relieving factors: Diclofenac and pain cream   OBJECTIVE: (objective measures completed at initial evaluation unless otherwise dated)   DIAGNOSTIC FINDINGS:  11/20/2021: DG Lumbar Spine 2-3 Views: IMPRESSION: 1. Severe degenerative changes of the lumbar spine appears similar to the prior study. 2. 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   PATIENT SURVEYS:  FOTO 49%, predicted 58% in 11 visits   SCREENING FOR RED FLAGS: Bowel or bladder incontinence: No Cauda equina syndrome: No   COGNITION:           Overall cognitive  status: Within functional limits for tasks assessed                          SENSATION: Not tested   MUSCLE LENGTH: Hamstring 90/90: Right 40 deg shy of full extension; Left 30 deg shy of full extension Thomas test: Moderate tightness BIL   POSTURE: rounded shoulders, forward head, decreased lumbar lordosis, and flexed trunk    PALPATION: No TTP in thoracic or lumbar paraspinals   PASSIVE ACCESSORY MOBILITY: Hypomobile CPAs throughout thoracic and lumbar spine   LUMBAR ROM:    Active  AROM  eval  Flexion 75%   Extension 25%  Right lateral flexion 50%  Left lateral flexion 50%  Right rotation 75%  Left rotation 75%   (Blank rows = not tested)     LOWER EXTREMITY MMT:     MMT Right eval Left eval  Hip flexion 4/5 3+/5  Hip extension 3/5 3/5  Hip abduction 3+/5 3+/5  Knee flexion 5/5 5/5  Knee extension 5/5 5/5  Ankle dorsiflexion 5/5 5/5  Ankle plantarflexion 5/5 5/5   (Blank rows = not tested)     FUNCTIONAL TESTS:  Squat: WNL Plank: 48 seconds, terminated due to shoulder fatigue 5xSTS: 11 seconds   GAIT: Distance walked: 20 ft Assistive device utilized: None Level of assistance: Complete Independence Comments: Short stride length and forward flexed trunk       TODAY'S TREATMENT  [facility] Adult PT Treatment:                                                DATE: 12/04/2021 Therapeutic Exercise: Nustep level 5 x 5 mins Bridges 2x10 Supine 90/90 isometric hold 2x30" Supine 90/90 isometric hold with handheld resistance on thighs 2x30" Supine 90/90 with alternating heel taps 2x10 Sidelying hip abduction 2x10 BIL Supine alternating clamshell BlueTB 2x10 BIL Modified thomas stretch x1' BIL Sidelying open books x10 BIL LTR x10 BIL STS arms crossed 2x10  11/29/2021: Demonstrated and issued HEP     PATIENT EDUCATION:  Education details: Pt educated on probable underlying pathophysiology, POC, prognosis, FOTO, activity modification for AS, and HEP Person educated: Patient Education method: Explanation, Demonstration, and Handouts Education comprehension: verbalized understanding and returned demonstration     HOME EXERCISE PROGRAM: Access Code: [email or web address] URL: [email or web address] Date: 11/29/2021 Prepared by: Vanessa Moffat   Exercises - Sidelying Open Book Thoracic Lumbar Rotation and Extension  - 1 x daily - 7 x weekly - 2 sets - 10 reps - 5 seconds hold - Supine Bridge  - 1 x daily - 7 x weekly - 3 sets - 10 reps - 3 seconds hold - Supine 90/90  Abdominal Bracing *with handhold resistance to thighs*  - 1 x daily - 7 x weekly - 3 sets - 30 seconds hold - Sidelying Hip Abduction  - 1 x daily - 7 x weekly - 3 sets - 10 reps - 3 seconds hold - Modified Thomas Stretch  - 1 x daily - 7 x weekly - 2 sets - 1 minute hold   ASSESSMENT:   CLINICAL IMPRESSION: Patient presents to PT with no current pain and reports HEP compliance. Session today focused on core and proximal hip strengthening as well as lumbar mobility. Patient was able to tolerate all prescribed exercises with no adverse effects. Patient continues to benefit  from skilled PT services and should be progressed as able to improve functional independence.     OBJECTIVE IMPAIRMENTS Abnormal gait, decreased activity tolerance, decreased balance, decreased endurance, decreased knowledge of condition, decreased mobility, difficulty walking, decreased ROM, decreased strength, hypomobility, impaired flexibility, improper body mechanics, postural dysfunction, and pain.    ACTIVITY LIMITATIONS carrying, lifting, bending, sitting, standing, squatting, sleeping, stairs, transfers, and locomotion level   PARTICIPATION LIMITATIONS: cleaning, laundry, driving, shopping, community activity, and yard work   PERSONAL FACTORS Age, Past/current experiences, Time since onset of injury/illness/exacerbation, and 3+ comorbidities: See medical hx  are also affecting patient's functional outcome.    REHAB POTENTIAL: Fair Due to advanced AS presentation, multiple comorbidities   CLINICAL DECISION MAKING: Stable/uncomplicated   EVALUATION COMPLEXITY: Low     GOALS: Goals reviewed with patient? Yes   SHORT TERM GOALS: Target date: 12/27/2021   Pt will report understanding and adherence to initial HEP in order to promote independence in the management of primary impairments. Baseline: HEP provided at eval Goal status: INITIAL     LONG TERM GOALS: Target date: 01/24/2022   Pt will achieve a FOTO score  of 58% in order to demonstrate improved functional ability as it relates to his primary impairments. Baseline: 49% Goal status: INITIAL   2.  Pt will reports the ability to walk 10 minutes without an AD in order to walk from the parking lot to his grandson's sports field with less limitation. Baseline: 2-3 minutes before requiring a seated rest Goal status: INITIAL   3.  Pt will achieve global BIL hip strength of 4+/5 in order to progress his independent exercise regimen with less limitation. Baseline: See MMT chart Goal status: INITIAL   4.  Pt will demonstrate understanding of management of AS including activity modification and appropriate exercises to progress his strength/ mobility. Baseline: Initial education at eval Goal status: INITIAL       PLAN: PT FREQUENCY: 1x/week   PT DURATION: 8 weeks   PLANNED INTERVENTIONS: Therapeutic exercises, Therapeutic activity, Neuromuscular re-education, Balance training, Gait training, Patient/Family education, Self Care, Joint mobilization, Stair training, Orthotic/Fit training, Aquatic Therapy, Dry Needling, Electrical stimulation, Spinal mobilization, Cryotherapy, Moist heat, Taping, Biofeedback, Ionotophoresis '4mg'$ /ml Dexamethasone, Manual therapy, and Re-evaluation.   PLAN FOR NEXT SESSION: Progress gentle mobility/ stretching, early strengthening, further education concerning activity modification for AS    Margarette Canada, PTA 12/04/2021, 12:54 PM

## 2021-12-08 ENCOUNTER — Ambulatory Visit
Admission: RE | Admit: 2021-12-08 | Discharge: 2021-12-08 | Disposition: A | Discharge status: Home / Self Care | Payer: Medicare Other | Source: Ambulatory Visit | Attending: Sports Medicine | Admitting: Sports Medicine

## 2021-12-08 DIAGNOSIS — M545 Low back pain, unspecified: Secondary | ICD-10-CM

## 2021-12-08 DIAGNOSIS — M48061 Spinal stenosis, lumbar region without neurogenic claudication: Secondary | ICD-10-CM | POA: Diagnosis not present

## 2021-12-08 DIAGNOSIS — M4316 Spondylolisthesis, lumbar region: Secondary | ICD-10-CM | POA: Diagnosis not present

## 2021-12-08 DIAGNOSIS — M456 Ankylosing spondylitis lumbar region: Secondary | ICD-10-CM

## 2021-12-11 ENCOUNTER — Ambulatory Visit: Payer: Medicare Other

## 2021-12-11 DIAGNOSIS — M6281 Muscle weakness (generalized): Secondary | ICD-10-CM | POA: Diagnosis not present

## 2021-12-11 DIAGNOSIS — G8929 Other chronic pain: Secondary | ICD-10-CM | POA: Diagnosis not present

## 2021-12-11 DIAGNOSIS — R262 Difficulty in walking, not elsewhere classified: Secondary | ICD-10-CM | POA: Diagnosis not present

## 2021-12-11 DIAGNOSIS — M5459 Other low back pain: Secondary | ICD-10-CM | POA: Diagnosis not present

## 2021-12-11 DIAGNOSIS — M456 Ankylosing spondylitis lumbar region: Secondary | ICD-10-CM | POA: Diagnosis not present

## 2021-12-11 DIAGNOSIS — M545 Low back pain, unspecified: Secondary | ICD-10-CM | POA: Diagnosis not present

## 2021-12-11 NOTE — Therapy (Signed)
OUTPATIENT PHYSICAL THERAPY TREATMENT NOTE   Patient Name: Andrew Jones MRN: [id number] DOB:1941/02/07, 81 y.o., male Today's Date: 12/11/2021  PCP: Glennon Mac, DO REFERRING PROVIDER: Glennon Mac, DO  END OF SESSION:   PT End of Session - 12/11/21 1128     Visit Number 3    Number of Visits 9    Date for PT Re-Evaluation 01/31/22    Authorization Type [employer]    Authorization Time Period FOTO v6, v10, kx mod v15    Progress Note Due on Visit 10    PT Start Time 1130    PT Stop Time 1210    PT Time Calculation (min) 40 min    Activity Tolerance Patient tolerated treatment well    Behavior During Therapy WFL for tasks assessed/performed              Past Medical History:  Diagnosis Date   Ankylosing spondylitis ([place])    back, hips - on embrel 2000-2008, resumed 12/2012   CVA (cerebral vascular accident) ([place]) 1999, 2002   no residual problems   Diabetes mellitus, type 2 ([place])    Dyslipidemia    Hypertension    Osteoarthritis    Seizure disorder ([place]) 05/2009   Sleep apnea    uses c-pap   Past Surgical History:  Procedure Laterality Date   NO PAST SURGERIES     Patient Active Problem List   Diagnosis Date Noted   Diabetic mononeuropathy associated with diabetes mellitus due to underlying condition ([place]) 07/25/2021   Vitamin D deficiency 07/25/2021   Obesity 07/03/2020   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   Chronic kidney disease, stage III (moderate) ([place]) 07/03/2020   Low magnesium level 04/29/2019   Seborrheic keratosis 05/15/2018   Hearing loss 12/01/2017   BPH (benign prostatic hypertrophy) 12/20/2014   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[place]) 12/20/2014   PSA elevation 12/20/2014   Encounter for therapeutic drug monitoring 12/08/2012   Diabetes mellitus, type 2 ([place])    Seizure disorder ([facility])    Hypertension    Hyperlipidemia associated with type 2 diabetes mellitus ([place])    CVA (cerebral  vascular accident) ([place])    Osteoarthritis     REFERRING DIAG:  M54.50,G89.29 (ICD-10-CM) - Chronic bilateral low back pain without sciatica  M45.6 (ICD-10-CM) - 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[place])    THERAPY DIAG:  Other low back pain  Muscle weakness (generalized)  Difficulty in walking, not elsewhere classified  Rationale for Evaluation and Treatment Rehabilitation  PERTINENT HISTORY: Ankylosing Spondylitis since 2000, hx of CVA x2 in 1999 and 2002, hx of grand mal seizure in 2011, DMII, HTN  PRECAUTIONS: Other: Avoid OMT, forceful trunk extension  SUBJECTIVE: Patient reports no current pain.  PAIN:  Are you having pain? Yes: NPRS scale: 0/10 Pain location: low back Pain description: achy, stiff Aggravating factors: standing after prolonged sitting, walking >153f (2-3 minutes) Relieving factors: Diclofenac and pain cream   OBJECTIVE: (objective measures completed at initial evaluation unless otherwise dated)   DIAGNOSTIC FINDINGS:  11/20/2021: DG Lumbar Spine 2-3 Views: IMPRESSION: 1. Severe degenerative changes of the lumbar spine appears similar to the prior study. 2. 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   PATIENT SURVEYS:  FOTO 49%, predicted 58% in 11 visits   SCREENING FOR RED FLAGS: Bowel or bladder incontinence: No Cauda equina syndrome: No   COGNITION:           Overall cognitive status: Within  functional limits for tasks assessed                          SENSATION: Not tested   MUSCLE LENGTH: Hamstring 90/90: Right 40 deg shy of full extension; Left 30 deg shy of full extension Thomas test: Moderate tightness BIL   POSTURE: rounded shoulders, forward head, decreased lumbar lordosis, and flexed trunk    PALPATION: No TTP in thoracic or lumbar paraspinals   PASSIVE ACCESSORY MOBILITY: Hypomobile CPAs throughout thoracic and lumbar spine   LUMBAR ROM:    Active  AROM  eval  Flexion 75%  Extension  25%  Right lateral flexion 50%  Left lateral flexion 50%  Right rotation 75%  Left rotation 75%   (Blank rows = not tested)     LOWER EXTREMITY MMT:     MMT Right eval Left eval  Hip flexion 4/5 3+/5  Hip extension 3/5 3/5  Hip abduction 3+/5 3+/5  Knee flexion 5/5 5/5  Knee extension 5/5 5/5  Ankle dorsiflexion 5/5 5/5  Ankle plantarflexion 5/5 5/5   (Blank rows = not tested)     FUNCTIONAL TESTS:  Squat: WNL Plank: 48 seconds, terminated due to shoulder fatigue 5xSTS: 11 seconds   GAIT: Distance walked: 20 ft Assistive device utilized: None Level of assistance: Complete Independence Comments: Short stride length and forward flexed trunk       TODAY'S TREATMENT  [facility] Adult PT Treatment:                                                DATE: 12/11/2021 Therapeutic Exercise: Nustep level 5 x 5 mins Bridges 2x10 Supine 90/90 isometric hold with handheld resistance on thighs 2x30" Supine 90/90 with alternating heel taps 2x10 Dead bugs 3x10 with black pball Sidelying hip abduction 2x10 BIL 2# SLR 2# 2x10 BIL Supine alternating clamshell BlueTB 2x10 BIL Modified thomas stretch x1' BIL Sidelying open books x10 BIL LTR x10 BIL STS arms crossed 2x10  [facility] Adult PT Treatment:                                                DATE: 12/04/2021 Therapeutic Exercise: Nustep level 5 x 5 mins Bridges 2x10 Supine 90/90 isometric hold 2x30" Supine 90/90 isometric hold with handheld resistance on thighs 2x30" Supine 90/90 with alternating heel taps 2x10 Sidelying hip abduction 2x10 BIL Supine alternating clamshell BlueTB 2x10 BIL Modified thomas stretch x1' BIL Sidelying open books x10 BIL LTR x10 BIL STS arms crossed 2x10  11/29/2021: Demonstrated and issued HEP     PATIENT EDUCATION:  Education details: Pt educated on probable underlying pathophysiology, POC, prognosis, FOTO, activity modification for AS, and HEP Person educated: Patient Education method: Explanation,  Demonstration, and Handouts Education comprehension: verbalized understanding and returned demonstration     HOME EXERCISE PROGRAM: Access Code: [email or web address] URL: [email or web address] Date: 11/29/2021 Prepared by: Vanessa Pollock Pines   Exercises - Sidelying Open Book Thoracic Lumbar Rotation and Extension  - 1 x daily - 7 x weekly - 2 sets - 10 reps - 5 seconds hold - Supine Bridge  - 1 x daily - 7 x weekly - 3 sets - 10 reps - 3 seconds hold -  Supine 90/90 Abdominal Bracing *with handhold resistance to thighs*  - 1 x daily - 7 x weekly - 3 sets - 30 seconds hold - Sidelying Hip Abduction  - 1 x daily - 7 x weekly - 3 sets - 10 reps - 3 seconds hold - Modified Thomas Stretch  - 1 x daily - 7 x weekly - 2 sets - 1 minute hold   ASSESSMENT:   CLINICAL IMPRESSION: Patient presents to PT with no current pain and reports daily HEP compliance. He states that he sees his MD tomorrow to go over his recent MRI results. Session today focused on core and proximal hip and lumbar mobility. Patient was able to tolerate all prescribed exercises with no adverse effects. Patient continues to benefit from skilled PT services and should be progressed as able to improve functional independence.    OBJECTIVE IMPAIRMENTS Abnormal gait, decreased activity tolerance, decreased balance, decreased endurance, decreased knowledge of condition, decreased mobility, difficulty walking, decreased ROM, decreased strength, hypomobility, impaired flexibility, improper body mechanics, postural dysfunction, and pain.    ACTIVITY LIMITATIONS carrying, lifting, bending, sitting, standing, squatting, sleeping, stairs, transfers, and locomotion level   PARTICIPATION LIMITATIONS: cleaning, laundry, driving, shopping, community activity, and yard work   PERSONAL FACTORS Age, Past/current experiences, Time since onset of injury/illness/exacerbation, and 3+ comorbidities: See medical hx  are also affecting patient's  functional outcome.    REHAB POTENTIAL: Fair Due to advanced AS presentation, multiple comorbidities   CLINICAL DECISION MAKING: Stable/uncomplicated   EVALUATION COMPLEXITY: Low     GOALS: Goals reviewed with patient? Yes   SHORT TERM GOALS: Target date: 12/27/2021   Pt will report understanding and adherence to initial HEP in order to promote independence in the management of primary impairments. Baseline: HEP provided at eval Goal status: INITIAL     LONG TERM GOALS: Target date: 01/24/2022   Pt will achieve a FOTO score of 58% in order to demonstrate improved functional ability as it relates to his primary impairments. Baseline: 49% Goal status: INITIAL   2.  Pt will reports the ability to walk 10 minutes without an AD in order to walk from the parking lot to his grandson's sports field with less limitation. Baseline: 2-3 minutes before requiring a seated rest Goal status: INITIAL   3.  Pt will achieve global BIL hip strength of 4+/5 in order to progress his independent exercise regimen with less limitation. Baseline: See MMT chart Goal status: INITIAL   4.  Pt will demonstrate understanding of management of AS including activity modification and appropriate exercises to progress his strength/ mobility. Baseline: Initial education at eval Goal status: INITIAL       PLAN: PT FREQUENCY: 1x/week   PT DURATION: 8 weeks   PLANNED INTERVENTIONS: Therapeutic exercises, Therapeutic activity, Neuromuscular re-education, Balance training, Gait training, Patient/Family education, Self Care, Joint mobilization, Stair training, Orthotic/Fit training, Aquatic Therapy, Dry Needling, Electrical stimulation, Spinal mobilization, Cryotherapy, Moist heat, Taping, Biofeedback, Ionotophoresis '4mg'$ /ml Dexamethasone, Manual therapy, and Re-evaluation.   PLAN FOR NEXT SESSION: Progress gentle mobility/ stretching, early strengthening, further education concerning activity modification for  AS    Margarette Canada, PTA 12/11/2021, 12:12 PM

## 2021-12-12 ENCOUNTER — Ambulatory Visit: Payer: Medicare Other | Admitting: Sports Medicine

## 2021-12-12 ENCOUNTER — Encounter: Payer: Self-pay | Admitting: Internal Medicine

## 2021-12-12 VITALS — BP 132/68 | HR 66 | Ht 67.0 in | Wt 187.0 lb

## 2021-12-12 DIAGNOSIS — M545 Low back pain, unspecified: Secondary | ICD-10-CM

## 2021-12-12 DIAGNOSIS — M456 Ankylosing spondylitis lumbar region: Secondary | ICD-10-CM | POA: Diagnosis not present

## 2021-12-12 DIAGNOSIS — R29898 Other symptoms and signs involving the musculoskeletal system: Secondary | ICD-10-CM

## 2021-12-12 DIAGNOSIS — G8929 Other chronic pain: Secondary | ICD-10-CM

## 2021-12-12 NOTE — Progress Notes (Signed)
Andrew Jones D.[employer] [place] Phone: [phone]   Assessment and Plan:     1. Chronic bilateral low back pain without sciatica 2. 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Andrew Jones) 3. 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  These are likely the cause of patient's radicular symptoms and pain - Patient elects to proceed with L2-L3 left-sided epidural.  Referral placed - Continue HEP  Pertinent previous records reviewed include lumbar MRI 12/08/2021   Follow Up: 2 weeks after epidural to review benefit   Subjective:    I, Andrew Jones, am serving as a Education administrator for Doctor Andrew Jones   Chief Complaint: low back pain    HPI:    11/20/2021 Patient is a 81 year old male complaining of low back pain. Patient states that 2 years ago he collapsed and was seen by neurology damage from statin , has week quads, lately he has started working out some machines and he feels as though he is getting weaker , he doesn't have any endurance I his quads and hips he uses a walker when he does walk , feels as he is getting weaker over the past two months    12/12/2021 Patient states that he is okay     Relevant Historical Information: Ankylosing spondylitis, CKD, hyperlipidemia, hypertension, DM type II with neuropathy  Additional pertinent review of systems negative.   Current Outpatient Medications:    Alirocumab 75 MG/ML SOAJ, Inject 75 mg into the skin every 14 (fourteen) days., Disp: 2 mL, Rfl:    amLODipine (NORVASC) 10 MG tablet, Take 1 tablet (10 mg total) by mouth daily., Disp: , Rfl:    aspirin EC 81 MG tablet, Take 1 tablet (81 mg total) by mouth daily. Swallow whole., Disp: 30 tablet, Rfl:  11   benazepril (LOTENSIN) 10 MG tablet, Take 10 mg by mouth daily., Disp: , Rfl:    Cholecalciferol (VITAMIN D3) 125 MCG (5000 UT) CAPS, Take 1 capsule (5,000 Units total) by mouth daily., Disp: 90 capsule, Rfl: 1   empagliflozin (JARDIANCE) 25 MG TABS tablet, Take 1 tablet (25 mg total) by mouth daily., Disp: 30 tablet, Rfl:    etanercept (ENBREL) 50 MG/ML injection, Inject 50 mg into the skin once a week., Disp: , Rfl:    ezetimibe (ZETIA) 10 MG tablet, Take 1 tablet (10 mg total) by mouth daily., Disp: 90 tablet, Rfl: 3   fish oil-omega-3 fatty acids 1000 MG capsule, Take 2 g by mouth daily., Disp: , Rfl:    LORazepam (ATIVAN) 0.5 MG tablet, 1-2 tabs 30 - 60 min prior to MRI. Do not drive with this medicine., Disp: 4 tablet, Rfl: 0   Magnesium Oxide 420 MG TABS, Take 1 tablet (420 mg total) by mouth daily after breakfast., Disp: , Rfl: 0   metFORMIN (GLUCOPHAGE) 1000 MG tablet, Take 1,000 mg by mouth 2 (two) times daily with a meal., Disp: , Rfl:    metoprolol tartrate (LOPRESSOR) 25 MG tablet, Take 25 mg by mouth 2 (two) times daily., Disp: , Rfl:    Multiple Vitamin (MULTIVITAMIN) tablet, Take 1 tablet by mouth daily., Disp: , Rfl:    omeprazole (PRILOSEC) 20 MG capsule, Take 20 mg by mouth daily., Disp: , Rfl:  RESTASIS 0.05 % ophthalmic emulsion, Place 1 drop into both eyes 2 (two) times daily., Disp: , Rfl:    Objective:     Vitals:   12/12/21 1108  BP: 132/68  Pulse: 66  SpO2: 99%  Weight: 187 lb (84.8 kg)  Height: '5\' 7"'$  (1.702 m)      Body mass index is 29.29 kg/m.    Physical Exam:    Gen: Appears well, nad, nontoxic and pleasant Psych: Alert and oriented, appropriate mood and affect Neuro: sensation intact, strength is 5/5 in upper extremities and 4+/5 in   lower extremities, muscle tone decreased throughout bilateral lower extremities Skin: no susupicious lesions or rashes   Back - Normal skin, Spine with normal alignment and no deformity.   No tenderness to  vertebral process palpation.   Paraspinous muscles are not tender and without spasm Straight leg raise negative Trendelenberg negative      Electronically signed by:  Andrew Jones D.[facility] 11:27 AM 12/12/21

## 2021-12-12 NOTE — Patient Instructions (Addendum)
Good to see you Epidural referral  Follow up 2 week after your injection to discuss your results

## 2021-12-17 ENCOUNTER — Ambulatory Visit
Admission: RE | Admit: 2021-12-17 | Discharge: 2021-12-17 | Disposition: A | Discharge status: Home / Self Care | Payer: Medicare Other | Source: Ambulatory Visit | Attending: Sports Medicine | Admitting: Sports Medicine

## 2021-12-17 DIAGNOSIS — M456 Ankylosing spondylitis lumbar region: Secondary | ICD-10-CM

## 2021-12-17 DIAGNOSIS — M48061 Spinal stenosis, lumbar region without neurogenic claudication: Secondary | ICD-10-CM | POA: Diagnosis not present

## 2021-12-17 DIAGNOSIS — G8929 Other chronic pain: Secondary | ICD-10-CM

## 2021-12-17 DIAGNOSIS — M47817 Spondylosis without myelopathy or radiculopathy, lumbosacral region: Secondary | ICD-10-CM | POA: Diagnosis not present

## 2021-12-17 DIAGNOSIS — R29898 Other symptoms and signs involving the musculoskeletal system: Secondary | ICD-10-CM

## 2021-12-17 MED ORDER — IOPAMIDOL (ISOVUE-M 200) INJECTION 41%
1.0000 mL | Freq: Once | INTRAMUSCULAR | Status: AC
  Administered 2021-12-17: 1 mL via EPIDURAL

## 2021-12-17 MED ORDER — METHYLPREDNISOLONE ACETATE 40 MG/ML INJ SUSP (RADIOLOG
80.0000 mg | Freq: Once | INTRAMUSCULAR | Status: AC
  Administered 2021-12-17: 80 mg via EPIDURAL

## 2021-12-17 NOTE — Discharge Instructions (Signed)

## 2021-12-19 ENCOUNTER — Ambulatory Visit: Payer: Medicare Other

## 2021-12-25 ENCOUNTER — Ambulatory Visit: Payer: Medicare Other | Attending: Sports Medicine

## 2021-12-25 DIAGNOSIS — M5459 Other low back pain: Secondary | ICD-10-CM | POA: Diagnosis not present

## 2021-12-25 DIAGNOSIS — R262 Difficulty in walking, not elsewhere classified: Secondary | ICD-10-CM | POA: Insufficient documentation

## 2021-12-25 DIAGNOSIS — M6281 Muscle weakness (generalized): Secondary | ICD-10-CM | POA: Diagnosis not present

## 2021-12-25 NOTE — Therapy (Signed)
OUTPATIENT PHYSICAL THERAPY TREATMENT NOTE   Patient Name: Andrew Jones MRN: [id number] DOB:Mar 14, 1941, 81 y.o., male Today's Date: 12/25/2021  PCP: Glennon Mac, DO REFERRING PROVIDER: Glennon Mac, DO  END OF SESSION:   PT End of Session - 12/25/21 1131     Visit Number 4    Number of Visits 9    Date for PT Re-Evaluation 01/31/22    Authorization Type BCBS    Authorization Time Period FOTO v6, v10, kx mod v15    Progress Note Due on Visit 10    PT Start Time 1132    PT Stop Time 1212    PT Time Calculation (min) 40 min    Activity Tolerance Patient tolerated treatment well    Behavior During Therapy [facility] for tasks assessed/performed               Past Medical History:  Diagnosis Date   Ankylosing spondylitis ([place])    back, hips - on embrel 2000-2008, resumed 12/2012   CVA (cerebral vascular accident) ([place]) 1999, 2002   no residual problems   Diabetes mellitus, type 2 ([place])    Dyslipidemia    Hypertension    Osteoarthritis    Seizure disorder ([place]) 05/2009   Sleep apnea    uses c-pap   Past Surgical History:  Procedure Laterality Date   NO PAST SURGERIES     Patient Active Problem List   Diagnosis Date Noted   Diabetic mononeuropathy associated with diabetes mellitus due to underlying condition ([place]) 07/25/2021   Vitamin D deficiency 07/25/2021   Obesity 07/03/2020   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   Chronic kidney disease, stage III (moderate) ([place]) 07/03/2020   Low magnesium level 04/29/2019   Seborrheic keratosis 05/15/2018   Hearing loss 12/01/2017   BPH (benign prostatic hypertrophy) 12/20/2014   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[place]) 12/20/2014   PSA elevation 12/20/2014   Encounter for therapeutic drug monitoring 12/08/2012   Diabetes mellitus, type 2 (HCC)    Seizure disorder (HCC)    Hypertension    Hyperlipidemia associated with type 2 diabetes mellitus ([place])    CVA (cerebral  vascular accident) ([place])    Osteoarthritis     REFERRING DIAG:  M54.50,G89.29 (ICD-10-CM) - Chronic bilateral low back pain without sciatica  M45.6 (ICD-10-CM) - Ankylosing spondylitis of lumbar region [facility])    THERAPY DIAG:  Other low back pain  Muscle weakness (generalized)  Difficulty in walking, not elsewhere classified  Rationale for Evaluation and Treatment Rehabilitation  PERTINENT HISTORY: Ankylosing Spondylitis since 2000, hx of CVA x2 in 1999 and 2002, hx of grand mal seizure in 2011, DMII, HTN  PRECAUTIONS: Other: Avoid OMT, forceful trunk extension  SUBJECTIVE: Pt reports having a lumbar epidural last Monday, which has been very helpful. Pt denies any pain currently and reports adherence to his HEP.   PAIN:  Are you having pain? Yes: NPRS scale: 0/10 Pain location: low back Pain description: achy, stiff Aggravating factors: standing after prolonged sitting, walking >14f (2-3 minutes) Relieving factors: Diclofenac and pain cream   OBJECTIVE: (objective measures completed at initial evaluation unless otherwise dated)   DIAGNOSTIC FINDINGS:  11/20/2021: DG Lumbar Spine 2-3 Views: IMPRESSION: 1. Severe degenerative changes of the lumbar spine appears similar to the prior study. 2. 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  12/08/2021: MR Lumbar Spine WO Contrast: IMPRESSION: 1. 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 2. Moderate to severe neural foraminal stenosis at L2-L3 (left) and moderate at L3-L4 (bilateral).   PATIENT SURVEYS:  FOTO 49%, predicted 58% in 11 visits   SCREENING FOR RED FLAGS: Bowel or bladder incontinence: No Cauda equina syndrome: No   COGNITION:           Overall cognitive status: Within functional limits for tasks assessed                          SENSATION: Not tested   MUSCLE LENGTH: Hamstring 90/90:  Right 40 deg shy of full extension; Left 30 deg shy of full extension Thomas test: Moderate tightness BIL   POSTURE: rounded shoulders, forward head, decreased lumbar lordosis, and flexed trunk    PALPATION: No TTP in thoracic or lumbar paraspinals   PASSIVE ACCESSORY MOBILITY: Hypomobile CPAs throughout thoracic and lumbar spine   LUMBAR ROM:    Active  AROM  eval  Flexion 75%  Extension 25%  Right lateral flexion 50%  Left lateral flexion 50%  Right rotation 75%  Left rotation 75%   (Blank rows = not tested)     LOWER EXTREMITY MMT:     MMT Right eval Left eval  Hip flexion 4/5 3+/5  Hip extension 3/5 3/5  Hip abduction 3+/5 3+/5  Knee flexion 5/5 5/5  Knee extension 5/5 5/5  Ankle dorsiflexion 5/5 5/5  Ankle plantarflexion 5/5 5/5   (Blank rows = not tested)     FUNCTIONAL TESTS:  Squat: WNL Plank: 48 seconds, terminated due to shoulder fatigue 5xSTS: 11 seconds   GAIT: Distance walked: 20 ft Assistive device utilized: None Level of assistance: Complete Independence Comments: Short stride length and forward flexed trunk       TODAY'S TREATMENT   [facility] Adult PT Treatment:                                                DATE: 12/25/2021 Therapeutic Exercise: Lumbar open book x10 BIL Seated sciatic nerve glide x20 BIL Seated piriformis stretch x63mn BIL Standing abdominal press-down with 17# cable with short amortization phase 3x10 Manual Therapy: N/A Neuromuscular re-ed: N/A Therapeutic Activity: Dead lift with 15# kettlebell 3x10 with pt education on proper lifting strategy 6-inch step up with 10# kettlebell in contralateral hand 2x10 BIL Modalities: N/A Self Care: N/A   [facility]                                                [facility] Therapeutic Exercise: Nustep level 5 x 5 mins Bridges 2x10 Supine 90/90 isometric hold with handheld resistance on thighs 2x30" Supine 90/90 with alternating heel taps 2x10 Dead bugs  3x10 with black pball Sidelying hip abduction 2x10 BIL 2# SLR 2# 2x10 BIL Supine alternating clamshell BlueTB 2x10 BIL Modified thomas stretch x1' BIL Sidelying open books x10 BIL LTR x10 BIL STS arms crossed 2x10  [facility] Adult PT Treatment:  DATE: 12/04/2021 Therapeutic Exercise: Nustep level 5 x 5 mins Bridges 2x10 Supine 90/90 isometric hold 2x30" Supine 90/90 isometric hold with handheld resistance on thighs 2x30" Supine 90/90 with alternating heel taps 2x10 Sidelying hip abduction 2x10 BIL Supine alternating clamshell BlueTB 2x10 BIL Modified thomas stretch x1' BIL Sidelying open books x10 BIL LTR x10 BIL STS arms crossed 2x10      PATIENT EDUCATION:  Education details: Pt educated on probable underlying pathophysiology, POC, prognosis, FOTO, activity modification for AS, and HEP Person educated: Patient Education method: Explanation, Demonstration, and Handouts Education comprehension: verbalized understanding and returned demonstration     HOME EXERCISE PROGRAM: Access Code: [email or web address] URL: [email or web address] Date: 11/29/2021 Prepared by: Vanessa [place]   Exercises - Sidelying Open Book Thoracic Lumbar Rotation and Extension  - 1 x daily - 7 x weekly - 2 sets - 10 reps - 5 seconds hold - Supine Bridge  - 1 x daily - 7 x weekly - 3 sets - 10 reps - 3 seconds hold - Supine 90/90 Abdominal Bracing *with handhold resistance to thighs*  - 1 x daily - 7 x weekly - 3 sets - 30 seconds hold - Sidelying Hip Abduction  - 1 x daily - 7 x weekly - 3 sets - 10 reps - 3 seconds hold - Modified Thomas Stretch  - 1 x daily - 7 x weekly - 2 sets - 1 minute hold  Added 12/25/2021: - Seated Slump Nerve Glide  - 1 x daily - 7 x weekly - 3 sets - 20 reps   ASSESSMENT:   CLINICAL IMPRESSION: Pt responded excellently to progressed lifting exercises today, including functional dead lifts and stair negotiation. He will  continue to benefit from skilled PT to address his primary impairments and return to his prior level of function with less limitation.    OBJECTIVE IMPAIRMENTS Abnormal gait, decreased activity tolerance, decreased balance, decreased endurance, decreased knowledge of condition, decreased mobility, difficulty walking, decreased ROM, decreased strength, hypomobility, impaired flexibility, improper body mechanics, postural dysfunction, and pain.    ACTIVITY LIMITATIONS carrying, lifting, bending, sitting, standing, squatting, sleeping, stairs, transfers, and locomotion level   PARTICIPATION LIMITATIONS: cleaning, laundry, driving, shopping, community activity, and yard work   PERSONAL FACTORS Age, Past/current experiences, Time since onset of injury/illness/exacerbation, and 3+ comorbidities: See medical hx  are also affecting patient's functional outcome.        GOALS: Goals reviewed with patient? Yes   SHORT TERM GOALS: Target date: 12/27/2021   Pt will report understanding and adherence to initial HEP in order to promote independence in the management of primary impairments. Baseline: HEP provided at eval Goal status: INITIAL     LONG TERM GOALS: Target date: 01/24/2022   Pt will achieve a FOTO score of 58% in order to demonstrate improved functional ability as it relates to his primary impairments. Baseline: 49% Goal status: INITIAL   2.  Pt will reports the ability to walk 10 minutes without an AD in order to walk from the parking lot to his grandson's sports field with less limitation. Baseline: 2-3 minutes before requiring a seated rest Goal status: INITIAL   3.  Pt will achieve global BIL hip strength of 4+/5 in order to progress his independent exercise regimen with less limitation. Baseline: See MMT chart Goal status: INITIAL   4.  Pt will demonstrate understanding of management of AS including activity modification and appropriate exercises to progress his strength/  mobility. Baseline: Initial education at eval Goal status: INITIAL  PLAN: PT FREQUENCY: 1x/week   PT DURATION: 8 weeks   PLANNED INTERVENTIONS: Therapeutic exercises, Therapeutic activity, Neuromuscular re-education, Balance training, Gait training, Patient/Family education, Self Care, Joint mobilization, Stair training, Orthotic/Fit training, Aquatic Therapy, Dry Needling, Electrical stimulation, Spinal mobilization, Cryotherapy, Moist heat, Taping, Biofeedback, Ionotophoresis '4mg'$ /ml Dexamethasone, Manual therapy, and Re-evaluation.   PLAN FOR NEXT SESSION: Progress gentle mobility/ stretching, early strengthening, further education concerning activity modification for AS    Vanessa [place], PT, DPT 12/25/21 12:13 PM

## 2021-12-31 NOTE — Progress Notes (Unsigned)
    Andrew Mccreedy D.[place] Phone: [phone]   Assessment and Plan:     There are no diagnoses linked to this encounter.  ***   Pertinent previous records reviewed include ***   Follow Up: ***     Subjective:   I, Andrew Jones, am serving as a Education administrator for Doctor Glennon Mac   Chief Complaint: low back pain    HPI:    11/20/2021 Patient is a 81 year old male complaining of low back pain. Patient states that 2 years ago he collapsed and was seen by neurology damage from statin , has week quads, lately he has started working out some machines and he feels as though he is getting weaker , he doesn't have any endurance I his quads and hips he uses a walker when he does walk , feels as he is getting weaker over the past two months    12/12/2021 Patient states that he is okay    01/01/2022 Patient states   Relevant Historical Information: Ankylosing spondylitis, CKD, hyperlipidemia, hypertension, DM type II with neuropathy  Additional pertinent review of systems negative.   Current Outpatient Medications:    Alirocumab 75 MG/ML SOAJ, Inject 75 mg into the skin every 14 (fourteen) days., Disp: 2 mL, Rfl:    amLODipine (NORVASC) 10 MG tablet, Take 1 tablet (10 mg total) by mouth daily., Disp: , Rfl:    aspirin EC 81 MG tablet, Take 1 tablet (81 mg total) by mouth daily. Swallow whole., Disp: 30 tablet, Rfl: 11   benazepril (LOTENSIN) 10 MG tablet, Take 10 mg by mouth daily., Disp: , Rfl:    Cholecalciferol (VITAMIN D3) 125 MCG (5000 UT) CAPS, Take 1 capsule (5,000 Units total) by mouth daily., Disp: 90 capsule, Rfl: 1   empagliflozin (JARDIANCE) 25 MG TABS tablet, Take 1 tablet (25 mg total) by mouth daily., Disp: 30 tablet, Rfl:    etanercept (ENBREL) 50 MG/ML injection, Inject 50 mg into the skin once a week., Disp: , Rfl:    ezetimibe (ZETIA) 10 MG tablet, Take 1 tablet (10 mg total) by mouth  daily., Disp: 90 tablet, Rfl: 3   fish oil-omega-3 fatty acids 1000 MG capsule, Take 2 g by mouth daily., Disp: , Rfl:    LORazepam (ATIVAN) 0.5 MG tablet, 1-2 tabs 30 - 60 min prior to MRI. Do not drive with this medicine., Disp: 4 tablet, Rfl: 0   Magnesium Oxide 420 MG TABS, Take 1 tablet (420 mg total) by mouth daily after breakfast., Disp: , Rfl: 0   metFORMIN (GLUCOPHAGE) 1000 MG tablet, Take 1,000 mg by mouth 2 (two) times daily with a meal., Disp: , Rfl:    metoprolol tartrate (LOPRESSOR) 25 MG tablet, Take 25 mg by mouth 2 (two) times daily., Disp: , Rfl:    Multiple Vitamin (MULTIVITAMIN) tablet, Take 1 tablet by mouth daily., Disp: , Rfl:    omeprazole (PRILOSEC) 20 MG capsule, Take 20 mg by mouth daily., Disp: , Rfl:    RESTASIS 0.05 % ophthalmic emulsion, Place 1 drop into both eyes 2 (two) times daily., Disp: , Rfl:    Objective:     There were no vitals filed for this visit.    There is no height or weight on file to calculate BMI.    Physical Exam:    ***   Electronically signed by:  Andrew Jones Sports [facility] 12:14 PM 12/31/21

## 2022-01-01 ENCOUNTER — Ambulatory Visit (INDEPENDENT_AMBULATORY_CARE_PROVIDER_SITE_OTHER): Payer: Medicare Other | Admitting: Sports Medicine

## 2022-01-01 VITALS — BP 130/80 | HR 72 | Ht 67.0 in | Wt 186.0 lb

## 2022-01-01 DIAGNOSIS — M545 Low back pain, unspecified: Secondary | ICD-10-CM

## 2022-01-01 DIAGNOSIS — R29898 Other symptoms and signs involving the musculoskeletal system: Secondary | ICD-10-CM | POA: Diagnosis not present

## 2022-01-01 DIAGNOSIS — M456 Ankylosing spondylitis lumbar region: Secondary | ICD-10-CM

## 2022-01-01 DIAGNOSIS — G8929 Other chronic pain: Secondary | ICD-10-CM

## 2022-01-01 NOTE — Patient Instructions (Addendum)
Good to see you  Glute HEP  As needed follow up

## 2022-01-02 ENCOUNTER — Ambulatory Visit: Payer: Medicare Other

## 2022-01-02 DIAGNOSIS — M5459 Other low back pain: Secondary | ICD-10-CM | POA: Diagnosis not present

## 2022-01-02 DIAGNOSIS — R262 Difficulty in walking, not elsewhere classified: Secondary | ICD-10-CM

## 2022-01-02 DIAGNOSIS — M6281 Muscle weakness (generalized): Secondary | ICD-10-CM | POA: Diagnosis not present

## 2022-01-02 NOTE — Therapy (Signed)
OUTPATIENT PHYSICAL THERAPY TREATMENT NOTE   Patient Name: HRITHIK BOSCHEE MRN: [id number] DOB:[birth year], 81 y.o., male Today's Date: 01/02/2022  PCP: Glennon Mac, DO REFERRING PROVIDER: Glennon Mac, DO  END OF SESSION:   PT End of Session - 01/02/22 1128     Visit Number 5    Number of Visits 9    Date for PT Re-Evaluation 01/31/22    Authorization Type BCBS    Authorization Time Period FOTO v6, v10, kx mod v15    Progress Note Due on Visit 10    PT Start Time 1130    PT Stop Time 1210    PT Time Calculation (min) 40 min    Activity Tolerance Patient tolerated treatment well    Behavior During Therapy WFL for tasks assessed/performed              Past Medical History:  Diagnosis Date   Ankylosing spondylitis ([place])    back, hips - on embrel 2000-2008, resumed 12/2012   CVA (cerebral vascular accident) ([place]) 1999, 2002   no residual problems   Diabetes mellitus, type 2 ([place])    Dyslipidemia    Hypertension    Osteoarthritis    Seizure disorder ([place]) 05/2009   Sleep apnea    uses c-pap   Past Surgical History:  Procedure Laterality Date   NO PAST SURGERIES     Patient Active Problem List   Diagnosis Date Noted   Diabetic mononeuropathy associated with diabetes mellitus due to underlying condition ([place]) 07/25/2021   Vitamin D deficiency 07/25/2021   Obesity 07/03/2020   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   Chronic kidney disease, stage III (moderate) ([place]) 07/03/2020   Low magnesium level 04/29/2019   Seborrheic keratosis 05/15/2018   Hearing loss 12/01/2017   BPH (benign prostatic hypertrophy) 12/20/2014   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[place]) 12/20/2014   PSA elevation 12/20/2014   Encounter for therapeutic drug monitoring 12/08/2012   Diabetes mellitus, type 2 ([place])    Seizure disorder (HCC)    Hypertension    Hyperlipidemia associated with type 2 diabetes mellitus ([place])    CVA (cerebral  vascular accident) ([place])    Osteoarthritis     REFERRING DIAG:  M54.50,G89.29 (ICD-10-CM) - Chronic bilateral low back pain without sciatica  M45.6 (ICD-10-CM) - 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[place])    THERAPY DIAG:  Other low back pain  Difficulty in walking, not elsewhere classified  Muscle weakness (generalized)  Rationale for Evaluation and Treatment Rehabilitation  PERTINENT HISTORY: Ankylosing Spondylitis since 2000, hx of CVA x2 in 1999 and 2002, hx of grand mal seizure in 2011, DMII, HTN  PRECAUTIONS: Other: Avoid OMT, forceful trunk extension  SUBJECTIVE: Patient reports he is still not having any pain since his epidural injection.   PAIN:  Are you having pain? Yes: NPRS scale: 0/10 Pain location: low back Pain description: achy, stiff Aggravating factors: standing after prolonged sitting, walking >172f (2-3 minutes) Relieving factors: Diclofenac and pain cream   OBJECTIVE: (objective measures completed at initial evaluation unless otherwise dated)   DIAGNOSTIC FINDINGS:  11/20/2021: DG Lumbar Spine 2-3 Views: IMPRESSION: 1. Severe degenerative changes of the lumbar spine appears similar to the prior study. 2. 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  12/08/2021: MR Lumbar Spine WO Contrast: IMPRESSION: 1. 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 2. Moderate to severe neural  foraminal stenosis at L2-L3 (left) and moderate at L3-L4 (bilateral).   PATIENT SURVEYS:  FOTO 49%, predicted 58% in 11 visits   SCREENING FOR RED FLAGS: Bowel or bladder incontinence: No Cauda equina syndrome: No   COGNITION:           Overall cognitive status: Within functional limits for tasks assessed                          SENSATION: Not tested   MUSCLE LENGTH: Hamstring 90/90: Right 40 deg shy of full extension; Left 30 deg shy of full  extension Thomas test: Moderate tightness BIL   POSTURE: rounded shoulders, forward head, decreased lumbar lordosis, and flexed trunk    PALPATION: No TTP in thoracic or lumbar paraspinals   PASSIVE ACCESSORY MOBILITY: Hypomobile CPAs throughout thoracic and lumbar spine   LUMBAR ROM:    Active  AROM  eval  Flexion 75%  Extension 25%  Right lateral flexion 50%  Left lateral flexion 50%  Right rotation 75%  Left rotation 75%   (Blank rows = not tested)     LOWER EXTREMITY MMT:     MMT Right eval Left eval  Hip flexion 4/5 3+/5  Hip extension 3/5 3/5  Hip abduction 3+/5 3+/5  Knee flexion 5/5 5/5  Knee extension 5/5 5/5  Ankle dorsiflexion 5/5 5/5  Ankle plantarflexion 5/5 5/5   (Blank rows = not tested)     FUNCTIONAL TESTS:  Squat: WNL Plank: 48 seconds, terminated due to shoulder fatigue 5xSTS: 11 seconds   GAIT: Distance walked: 20 ft Assistive device utilized: None Level of assistance: Complete Independence Comments: Short stride length and forward flexed trunk       TODAY'S TREATMENT  [facility] Adult PT Treatment:                                                DATE: 01/02/2022 Therapeutic Exercise: Nustep level 5 x 5 mins Palloff press 10# cable 2x10 BIL Lumbar open book x10 BIL Seated sciatic nerve glide x20 BIL Seated piriformis stretch x24mn BIL Bridges 2x10 Supine 90/90 isometric hold with handheld resistance on thighs 3x30" Dead bugs 2x10 with red pball Therapeutic Activity: Dead lift with 15# kettlebell 3x10 with pt education on proper lifting strategy  [facility] Adult PT Treatment:                                                DATE: 12/25/2021 Therapeutic Exercise: Lumbar open book x10 BIL Seated sciatic nerve glide x20 BIL Seated piriformis stretch x143m BIL Standing abdominal press-down with 17# cable with short amortization phase 3x10 Manual Therapy: N/A Neuromuscular re-ed: N/A Therapeutic Activity: Dead lift with 15# kettlebell 3x10  with pt education on proper lifting strategy 6-inch step up with 10# kettlebell in contralateral hand 2x10 BIL Modalities: N/A Self Care: N/A   [facility]                                                DATE: 12/11/2021 Therapeutic Exercise: Nustep level 5  x 5 mins Bridges 2x10 Supine 90/90 isometric hold with handheld resistance on thighs 2x30" Supine 90/90 with alternating heel taps 2x10 Dead bugs 3x10 with black pball Sidelying hip abduction 2x10 BIL 2# SLR 2# 2x10 BIL Supine alternating clamshell BlueTB 2x10 BIL Modified thomas stretch x1' BIL Sidelying open books x10 BIL LTR x10 BIL STS arms crossed 2x10      PATIENT EDUCATION:  Education details: Pt educated on probable underlying pathophysiology, POC, prognosis, FOTO, activity modification for AS, and HEP Person educated: Patient Education method: Explanation, Demonstration, and Handouts Education comprehension: verbalized understanding and returned demonstration     HOME EXERCISE PROGRAM: Access Code: [email or web address] URL: [email or web address] Date: 11/29/2021 Prepared by: Vanessa [place]   Exercises - Sidelying Open Book Thoracic Lumbar Rotation and Extension  - 1 x daily - 7 x weekly - 2 sets - 10 reps - 5 seconds hold - Supine Bridge  - 1 x daily - 7 x weekly - 3 sets - 10 reps - 3 seconds hold - Supine 90/90 Abdominal Bracing *with handhold resistance to thighs*  - 1 x daily - 7 x weekly - 3 sets - 30 seconds hold - Sidelying Hip Abduction  - 1 x daily - 7 x weekly - 3 sets - 10 reps - 3 seconds hold - Modified Thomas Stretch  - 1 x daily - 7 x weekly - 2 sets - 1 minute hold  Added 12/25/2021: - Seated Slump Nerve Glide  - 1 x daily - 7 x weekly - 3 sets - 20 reps   ASSESSMENT:   CLINICAL IMPRESSION: Patient presents to PT with no current pain in his lower back, stating the epidural injection has been helpful. Session today continued to focused on BIL LE and core strengthening as  well as functional lifting. Patient was able to tolerate all prescribed exercises with no adverse effects. Patient continues to benefit from skilled PT services and should be progressed as able to improve functional independence.     OBJECTIVE IMPAIRMENTS Abnormal gait, decreased activity tolerance, decreased balance, decreased endurance, decreased knowledge of condition, decreased mobility, difficulty walking, decreased ROM, decreased strength, hypomobility, impaired flexibility, improper body mechanics, postural dysfunction, and pain.    ACTIVITY LIMITATIONS carrying, lifting, bending, sitting, standing, squatting, sleeping, stairs, transfers, and locomotion level   PARTICIPATION LIMITATIONS: cleaning, laundry, driving, shopping, community activity, and yard work   PERSONAL FACTORS Age, Past/current experiences, Time since onset of injury/illness/exacerbation, and 3+ comorbidities: See medical hx  are also affecting patient's functional outcome.        GOALS: Goals reviewed with patient? Yes   SHORT TERM GOALS: Target date: 12/27/2021   Pt will report understanding and adherence to initial HEP in order to promote independence in the management of primary impairments. Baseline: HEP provided at eval Goal status: INITIAL     LONG TERM GOALS: Target date: 01/24/2022   Pt will achieve a FOTO score of 58% in order to demonstrate improved functional ability as it relates to his primary impairments. Baseline: 49% Goal status: INITIAL   2.  Pt will reports the ability to walk 10 minutes without an AD in order to walk from the parking lot to his grandson's sports field with less limitation. Baseline: 2-3 minutes before requiring a seated rest Goal status: INITIAL   3.  Pt will achieve global BIL hip strength of 4+/5 in order to progress his independent exercise regimen with less limitation. Baseline: See MMT chart Goal status: INITIAL   4.  Pt will  demonstrate understanding of management  of AS including activity modification and appropriate exercises to progress his strength/ mobility. Baseline: Initial education at eval Goal status: INITIAL       PLAN: PT FREQUENCY: 1x/week   PT DURATION: 8 weeks   PLANNED INTERVENTIONS: Therapeutic exercises, Therapeutic activity, Neuromuscular re-education, Balance training, Gait training, Patient/Family education, Self Care, Joint mobilization, Stair training, Orthotic/Fit training, Aquatic Therapy, Dry Needling, Electrical stimulation, Spinal mobilization, Cryotherapy, Moist heat, Taping, Biofeedback, Ionotophoresis '4mg'$ /ml Dexamethasone, Manual therapy, and Re-evaluation.   PLAN FOR NEXT SESSION: Progress gentle mobility/ stretching, early strengthening, further education concerning activity modification for AS    Margarette [place], PTA 01/02/22 12:08 PM

## 2022-01-09 ENCOUNTER — Ambulatory Visit: Payer: Medicare Other

## 2022-01-09 DIAGNOSIS — M6281 Muscle weakness (generalized): Secondary | ICD-10-CM | POA: Diagnosis not present

## 2022-01-09 DIAGNOSIS — R262 Difficulty in walking, not elsewhere classified: Secondary | ICD-10-CM | POA: Diagnosis not present

## 2022-01-09 DIAGNOSIS — M5459 Other low back pain: Secondary | ICD-10-CM

## 2022-01-09 NOTE — Therapy (Signed)
OUTPATIENT PHYSICAL THERAPY TREATMENT NOTE   Patient Name: TYRAIL GRANDFIELD MRN: [id number] DOB:1940/12/12, 81 y.o., male Today's Date: 01/09/2022  PCP: Glennon Mac, DO REFERRING PROVIDER: Glennon Mac, DO  END OF SESSION:   PT End of Session - 01/09/22 1125     Visit Number 6    Number of Visits 9    Date for PT Re-Evaluation 01/31/22    Authorization Type BCBS    Authorization Time Period FOTO v6, v10, kx mod v15    Progress Note Due on Visit 10    PT Start Time 1130    PT Stop Time 1212    PT Time Calculation (min) 42 min    Activity Tolerance Patient tolerated treatment well    Behavior During Therapy WFL for tasks assessed/performed              Past Medical History:  Diagnosis Date   Ankylosing spondylitis ([place])    back, hips - on embrel 2000-2008, resumed 12/2012   CVA (cerebral vascular accident) ([place]) 1999, 2002   no residual problems   Diabetes mellitus, type 2 ([place])    Dyslipidemia    Hypertension    Osteoarthritis    Seizure disorder ([place]) 05/2009   Sleep apnea    uses c-pap   Past Surgical History:  Procedure Laterality Date   NO PAST SURGERIES     Patient Active Problem List   Diagnosis Date Noted   Diabetic mononeuropathy associated with diabetes mellitus due to underlying condition ([place]) 07/25/2021   Vitamin D deficiency 07/25/2021   Obesity 07/03/2020   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   Chronic kidney disease, stage III (moderate) ([place]) 07/03/2020   Low magnesium level 04/29/2019   Seborrheic keratosis 05/15/2018   Hearing loss 12/01/2017   BPH (benign prostatic hypertrophy) 12/20/2014   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[place]) 12/20/2014   PSA elevation 12/20/2014   Encounter for therapeutic drug monitoring 12/08/2012   Diabetes mellitus, type 2 ([facility])    Seizure disorder ([facility])    Hypertension    Hyperlipidemia associated with type 2 diabetes mellitus ([place])    CVA (cerebral  vascular accident) ([place])    Osteoarthritis     REFERRING DIAG:  M54.50,G89.29 (ICD-10-CM) - Chronic bilateral low back pain without sciatica  M45.6 (ICD-10-CM) - 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[place])    THERAPY DIAG:  Other low back pain  Difficulty in walking, not elsewhere classified  Muscle weakness (generalized)  Rationale for Evaluation and Treatment Rehabilitation  PERTINENT HISTORY: Ankylosing Spondylitis since 2000, hx of CVA x2 in 1999 and 2002, hx of grand mal seizure in 2011, DMII, HTN  PRECAUTIONS: Other: Avoid OMT, forceful trunk extension  SUBJECTIVE: Patient reports that over the past couple of days he has felt like the epidural injection has worn off and his pain has been returning. He also states that his thighs have been feeling especially weak.  PAIN:  Are you having pain? Yes: NPRS scale: 5/10 Pain location: low back Pain description: achy, stiff Aggravating factors: standing after prolonged sitting, walking >153f (2-3 minutes) Relieving factors: Diclofenac and pain cream   OBJECTIVE: (objective measures completed at initial evaluation unless otherwise dated)   DIAGNOSTIC FINDINGS:  11/20/2021: DG Lumbar Spine 2-3 Views: IMPRESSION: 1. Severe degenerative changes of the lumbar spine appears similar to the prior study. 2. 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  12/08/2021: MR Lumbar Spine WO Contrast: IMPRESSION: 1. 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 2. Moderate to severe neural foraminal stenosis at L2-L3 (left) and moderate at L3-L4 (bilateral).   PATIENT SURVEYS:  FOTO 49%, predicted 58% in 11 visits   SCREENING FOR RED FLAGS: Bowel or bladder incontinence: No Cauda equina syndrome: No   COGNITION:           Overall cognitive status: Within functional limits for tasks assessed                           SENSATION: Not tested   MUSCLE LENGTH: Hamstring 90/90: Right 40 deg shy of full extension; Left 30 deg shy of full extension Thomas test: Moderate tightness BIL   POSTURE: rounded shoulders, forward head, decreased lumbar lordosis, and flexed trunk    PALPATION: No TTP in thoracic or lumbar paraspinals   PASSIVE ACCESSORY MOBILITY: Hypomobile CPAs throughout thoracic and lumbar spine   LUMBAR ROM:    Active  AROM  eval  Flexion 75%  Extension 25%  Right lateral flexion 50%  Left lateral flexion 50%  Right rotation 75%  Left rotation 75%   (Blank rows = not tested)     LOWER EXTREMITY MMT:     MMT Right eval Left eval  Hip flexion 4/5 3+/5  Hip extension 3/5 3/5  Hip abduction 3+/5 3+/5  Knee flexion 5/5 5/5  Knee extension 5/5 5/5  Ankle dorsiflexion 5/5 5/5  Ankle plantarflexion 5/5 5/5   (Blank rows = not tested)     FUNCTIONAL TESTS:  Squat: WNL Plank: 48 seconds, terminated due to shoulder fatigue 5xSTS: 11 seconds   GAIT: Distance walked: 20 ft Assistive device utilized: None Level of assistance: Complete Independence Comments: Short stride length and forward flexed trunk       TODAY'S TREATMENT  [facility] Adult PT Treatment:                                                DATE: 01/09/2022 Therapeutic Exercise: Nustep level 6 x 5 mins Palloff press 10# cable 2x10 BIL Standing hip abduction 2x10 BIL Lumbar open book x10 BIL Seated sciatic nerve glide x20 BIL Seated piriformis stretch x28mn BIL Bridges 3" hold at top 2x10 Supine 90/90 isometric hold with handheld resistance on thighs 3x30" Dead bugs 3x10 with red pball Therapeutic Activity: Dead lift with 10# kettlebell 3x10 (15# KB was in use)  [facility] Adult PT Treatment:                                                DATE: 01/02/2022 Therapeutic Exercise: Nustep level 5 x 5 mins Palloff press 10# cable 2x10 BIL Lumbar open book x10 BIL Seated sciatic nerve glide x20 BIL Seated piriformis  stretch x166m BIL Bridges 2x10 Supine 90/90 isometric hold with handheld resistance on thighs 3x30" Dead bugs 2x10 with red pball Therapeutic Activity: Dead lift with 15# kettlebell 3x10 with pt education on proper lifting strategy  [facility] Adult PT Treatment:  DATE: 12/25/2021 Therapeutic Exercise: Lumbar open book x10 BIL Seated sciatic nerve glide x20 BIL Seated piriformis stretch x65mn BIL Standing abdominal press-down with 17# cable with short amortization phase 3x10 Manual Therapy: N/A Neuromuscular re-ed: N/A Therapeutic Activity: Dead lift with 15# kettlebell 3x10 with pt education on proper lifting strategy 6-inch step up with 10# kettlebell in contralateral hand 2x10 BIL Modalities: N/A Self Care: N/A     PATIENT EDUCATION:  Education details: Pt educated on probable underlying pathophysiology, POC, prognosis, FOTO, activity modification for AS, and HEP Person educated: Patient Education method: Explanation, Demonstration, and Handouts Education comprehension: verbalized understanding and returned demonstration     HOME EXERCISE PROGRAM: Access Code: [email or web address]: [email or web address] Date: 11/29/2021 Prepared by: TVanessa Herbst  Exercises - Sidelying Open Book Thoracic Lumbar Rotation and Extension  - 1 x daily - 7 x weekly - 2 sets - 10 reps - 5 seconds hold - Supine Bridge  - 1 x daily - 7 x weekly - 3 sets - 10 reps - 3 seconds hold - Supine 90/90 Abdominal Bracing *with handhold resistance to thighs*  - 1 x daily - 7 x weekly - 3 sets - 30 seconds hold - Sidelying Hip Abduction  - 1 x daily - 7 x weekly - 3 sets - 10 reps - 3 seconds hold - Modified Thomas Stretch  - 1 x daily - 7 x weekly - 2 sets - 1 minute hold  Added 12/25/2021: - Seated Slump Nerve Glide  - 1 x daily - 7 x weekly - 3 sets - 20 reps   ASSESSMENT:   CLINICAL IMPRESSION: Patient presents to PT with increased pain today,  stating the epidural injection has been wearing off the past couple days. Session today continued to focus on BIL LE and core strengthening as well as functional lifting. Patient was able to tolerate all prescribed exercises with no adverse effects. Patient continues to benefit from skilled PT services and should be progressed as able to improve functional independence.     OBJECTIVE IMPAIRMENTS Abnormal gait, decreased activity tolerance, decreased balance, decreased endurance, decreased knowledge of condition, decreased mobility, difficulty walking, decreased ROM, decreased strength, hypomobility, impaired flexibility, improper body mechanics, postural dysfunction, and pain.    ACTIVITY LIMITATIONS carrying, lifting, bending, sitting, standing, squatting, sleeping, stairs, transfers, and locomotion level   PARTICIPATION LIMITATIONS: cleaning, laundry, driving, shopping, community activity, and yard work   PERSONAL FACTORS Age, Past/current experiences, Time since onset of injury/illness/exacerbation, and 3+ comorbidities: See medical hx  are also affecting patient's functional outcome.        GOALS: Goals reviewed with patient? Yes   SHORT TERM GOALS: Target date: 12/27/2021   Pt will report understanding and adherence to initial HEP in order to promote independence in the management of primary impairments. Baseline: HEP provided at eval Goal status: INITIAL     LONG TERM GOALS: Target date: 01/24/2022   Pt will achieve a FOTO score of 58% in order to demonstrate improved functional ability as it relates to his primary impairments. Baseline: 49% Goal status: INITIAL   2.  Pt will reports the ability to walk 10 minutes without an AD in order to walk from the parking lot to his grandson's sports field with less limitation. Baseline: 2-3 minutes before requiring a seated rest Goal status: INITIAL   3.  Pt will achieve global BIL hip strength of 4+/5 in order to progress his independent  exercise regimen with less limitation. Baseline: See MMT chart Goal  status: INITIAL   4.  Pt will demonstrate understanding of management of AS including activity modification and appropriate exercises to progress his strength/ mobility. Baseline: Initial education at eval Goal status: INITIAL       PLAN: PT FREQUENCY: 1x/week   PT DURATION: 8 weeks   PLANNED INTERVENTIONS: Therapeutic exercises, Therapeutic activity, Neuromuscular re-education, Balance training, Gait training, Patient/Family education, Self Care, Joint mobilization, Stair training, Orthotic/Fit training, Aquatic Therapy, Dry Needling, Electrical stimulation, Spinal mobilization, Cryotherapy, Moist heat, Taping, Biofeedback, Ionotophoresis '4mg'$ /ml Dexamethasone, Manual therapy, and Re-evaluation.   PLAN FOR NEXT SESSION: Progress gentle mobility/ stretching, early strengthening, further education concerning activity modification for AS    Margarette [place], PTA 01/09/22 12:11 PM

## 2022-01-10 LAB — MICROALBUMIN, URINE: Microalb, Ur: 1.02

## 2022-01-10 LAB — LIPID PANEL
Cholesterol: 166 (ref 0–200)
HDL: 48 (ref 35–70)
LDL Cholesterol: 59
Triglycerides: 294 — AB (ref 40–160)

## 2022-01-10 LAB — BASIC METABOLIC PANEL
CO2: 27 — AB (ref 13–22)
Chloride: 105 (ref 99–108)
Glucose: 137
Potassium: 5.6 mEq/L — AB (ref 3.5–5.1)
Sodium: 136 — AB (ref 137–147)

## 2022-01-10 LAB — HEPATIC FUNCTION PANEL
ALT: 48 U/L — AB (ref 10–40)
AST: 19 (ref 14–40)
Alkaline Phosphatase: 76 (ref 25–125)
Bilirubin, Direct: 0.1
Bilirubin, Total: 0.5

## 2022-01-10 LAB — MICROALBUMIN / CREATININE URINE RATIO: Microalb Creat Ratio: 16.1

## 2022-01-10 LAB — CBC AND DIFFERENTIAL
HCT: 38 — AB (ref 41–53)
Hemoglobin: 12.3 — AB (ref 13.5–17.5)
Platelets: 280 10*3/uL (ref 150–400)
WBC: 10.4

## 2022-01-10 LAB — COMPREHENSIVE METABOLIC PANEL
Albumin: 3.8 (ref 3.5–5.0)
Calcium: 9.8 (ref 8.7–10.7)
eGFR: 70

## 2022-01-10 LAB — HEMOGLOBIN A1C: Hemoglobin A1C: 8.1

## 2022-01-10 LAB — CBC: RBC: 4.73 (ref 3.87–5.11)

## 2022-01-10 LAB — VITAMIN D 25 HYDROXY (VIT D DEFICIENCY, FRACTURES): Vit D, 25-Hydroxy: 68.56

## 2022-01-12 ENCOUNTER — Encounter: Payer: Self-pay | Admitting: Nurse Practitioner

## 2022-01-15 ENCOUNTER — Ambulatory Visit: Payer: Medicare Other

## 2022-01-16 ENCOUNTER — Encounter: Payer: Self-pay | Admitting: Nurse Practitioner

## 2022-01-22 ENCOUNTER — Ambulatory Visit: Payer: Medicare Other

## 2022-01-22 DIAGNOSIS — R262 Difficulty in walking, not elsewhere classified: Secondary | ICD-10-CM | POA: Diagnosis not present

## 2022-01-22 DIAGNOSIS — M5459 Other low back pain: Secondary | ICD-10-CM

## 2022-01-22 DIAGNOSIS — M6281 Muscle weakness (generalized): Secondary | ICD-10-CM

## 2022-01-22 NOTE — Therapy (Signed)
OUTPATIENT PHYSICAL THERAPY TREATMENT NOTE   Patient Name: Andrew Jones MRN: [id number] DOB:Feb 03, 1941, 81 y.o., male Today's Date: 01/22/2022  PCP: Glennon Mac, DO REFERRING PROVIDER: Glennon Mac, DO  END OF SESSION:   PT End of Session - 01/22/22 1129     Visit Number 7    Number of Visits 9    Date for PT Re-Evaluation 01/31/22    Authorization Type BCBS    Authorization Time Period FOTO v6, v10, kx mod v15    Progress Note Due on Visit 10    PT Start Time 1130    PT Stop Time 1212    PT Time Calculation (min) 42 min    Activity Tolerance Patient tolerated treatment well    Behavior During Therapy WFL for tasks assessed/performed               Past Medical History:  Diagnosis Date   Ankylosing spondylitis ([place])    back, hips - on embrel 2000-2008, resumed 12/2012   CVA (cerebral vascular accident) ([place]) 1999, 2002   no residual problems   Diabetes mellitus, type 2 ([place])    Dyslipidemia    Hypertension    Osteoarthritis    Seizure disorder ([place]) 05/2009   Sleep apnea    uses c-pap   Past Surgical History:  Procedure Laterality Date   NO PAST SURGERIES     Patient Active Problem List   Diagnosis Date Noted   Diabetic mononeuropathy associated with diabetes mellitus due to underlying condition ([place]) 07/25/2021   Vitamin D deficiency 07/25/2021   Obesity 07/03/2020   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   Chronic kidney disease, stage III (moderate) ([place]) 07/03/2020   Low magnesium level 04/29/2019   Seborrheic keratosis 05/15/2018   Hearing loss 12/01/2017   BPH (benign prostatic hypertrophy) 12/20/2014   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[place]) 12/20/2014   PSA elevation 12/20/2014   Encounter for therapeutic drug monitoring 12/08/2012   Diabetes mellitus, type 2 ([place])    Seizure disorder ([facility])    Hypertension    Hyperlipidemia associated with type 2 diabetes mellitus ([place])    CVA (cerebral  vascular accident) ([place])    Osteoarthritis     REFERRING DIAG:  M54.50,G89.29 (ICD-10-CM) - Chronic bilateral low back pain without sciatica  M45.6 (ICD-10-CM) - 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[place])    THERAPY DIAG:  Other low back pain  Difficulty in walking, not elsewhere classified  Muscle weakness (generalized)  Rationale for Evaluation and Treatment Rehabilitation  PERTINENT HISTORY: Ankylosing Spondylitis since 2000, hx of CVA x2 in 1999 and 2002, hx of grand mal seizure in 2011, DMII, HTN  PRECAUTIONS: Other: Avoid OMT, forceful trunk extension  SUBJECTIVE: Pt reports his lumbar injection has completely worn off. He rates his pain as 3-4/10 today. He reports continued HEP adherence.   PAIN:  Are you having pain? Yes: NPRS scale: 3-4/10 Pain location: low back Pain description: achy, stiff Aggravating factors: standing after prolonged sitting, walking >144f (2-3 minutes) Relieving factors: Diclofenac and pain cream   OBJECTIVE: (objective measures completed at initial evaluation unless otherwise dated)   DIAGNOSTIC FINDINGS:  11/20/2021: DG Lumbar Spine 2-3 Views: IMPRESSION: 1. Severe degenerative changes of the lumbar spine appears similar to the prior study. 2. 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  12/08/2021: MR Lumbar Spine WO Contrast: IMPRESSION: 1. There is moderate spinal canal stenosis at L2-L3 and L3-L4 secondary to a combination of disc bulge  and ligamentum flavum hypertrophy. 2. Moderate to severe neural foraminal stenosis at L2-L3 (left) and moderate at L3-L4 (bilateral).   PATIENT SURVEYS:  FOTO 49%, predicted 58% in 11 visits 01/22/2022: 55%   SCREENING FOR RED FLAGS: Bowel or bladder incontinence: No Cauda equina syndrome: No   COGNITION:           Overall cognitive status: Within functional limits for tasks assessed                          SENSATION: Not tested   MUSCLE LENGTH: Hamstring  90/90: Right 40 deg shy of full extension; Left 30 deg shy of full extension Thomas test: Moderate tightness BIL   POSTURE: rounded shoulders, forward head, decreased lumbar lordosis, and flexed trunk    PALPATION: No TTP in thoracic or lumbar paraspinals   PASSIVE ACCESSORY MOBILITY: Hypomobile CPAs throughout thoracic and lumbar spine   LUMBAR ROM:    Active  AROM  eval  Flexion 75%  Extension 25%  Right lateral flexion 50%  Left lateral flexion 50%  Right rotation 75%  Left rotation 75%   (Blank rows = not tested)     LOWER EXTREMITY MMT:     MMT Right eval Left eval Right 01/22/2022 Left 01/22/2022  Hip flexion 4/5 3+/5 4+/5 4+/5  Hip extension 3/5 3/5 4+/5 4+/5  Hip abduction 3+/5 3+/5 4+/5 4+/5  Knee flexion 5/5 5/5    Knee extension 5/5 5/5    Ankle dorsiflexion 5/5 5/5    Ankle plantarflexion 5/5 5/5     (Blank rows = not tested)     FUNCTIONAL TESTS:  Squat: WNL Plank: 48 seconds, terminated due to shoulder fatigue 5xSTS: 11 seconds   GAIT: Distance walked: 20 ft Assistive device utilized: None Level of assistance: Complete [place] Comments: Short stride length and forward flexed trunk       TODAY'S TREATMENT   [facility] Adult PT Treatment:                                                DATE: 01/22/2022 Therapeutic Exercise: Marching Pallof press with 7# cable 3x10 marches BIL Romanian deadlift with 10# cable 3x10 2-inch lateral heel tap with 15# kettlebell 2x10 BIL Seated abdominal pressdown with 20# cable 3x10 Manual Therapy: N/A Neuromuscular re-ed: N/A Therapeutic Activity: Re-assessment of objective measures with pt education Re-administration of FOTO with pt educaiton Modalities: N/A Self Care: N/A   [facility] Adult PT Treatment:                                                DATE: 01/09/2022 Therapeutic Exercise: Nustep level 6 x 5 mins Palloff press 10# cable 2x10 BIL Standing hip abduction 2x10 BIL Lumbar open book x10  BIL Seated sciatic nerve glide x20 BIL Seated piriformis stretch x44mn BIL Bridges 3" hold at top 2x10 Supine 90/90 isometric hold with handheld resistance on thighs 3x30" Dead bugs 3x10 with red pball Therapeutic Activity: Dead lift with 10# kettlebell 3x10 (15# KB was in use)  [facility] Adult PT Treatment:  DATE: 01/02/2022 Therapeutic Exercise: Nustep level 5 x 5 mins Palloff press 10# cable 2x10 BIL Lumbar open book x10 BIL Seated sciatic nerve glide x20 BIL Seated piriformis stretch x64mn BIL Bridges 2x10 Supine 90/90 isometric hold with handheld resistance on thighs 3x30" Dead bugs 2x10 with red pball Therapeutic Activity: Dead lift with 15# kettlebell 3x10 with pt education on proper lifting strategy     PATIENT EDUCATION:  Education details: Pt educated on probable underlying pathophysiology, POC, prognosis, FOTO, activity modification for AS, and HEP Person educated: Patient Education method: Explanation, Demonstration, and Handouts Education comprehension: verbalized understanding and returned demonstration     HOME EXERCISE PROGRAM: Access Code: [email or web address]: [email or web address] Date: 11/29/2021 Prepared by: TVanessa [place]  Exercises - Sidelying Open Book Thoracic Lumbar Rotation and Extension  - 1 x daily - 7 x weekly - 2 sets - 10 reps - 5 seconds hold - Supine Bridge  - 1 x daily - 7 x weekly - 3 sets - 10 reps - 3 seconds hold - Supine 90/90 Abdominal Bracing *with handhold resistance to thighs*  - 1 x daily - 7 x weekly - 3 sets - 30 seconds hold - Sidelying Hip Abduction  - 1 x daily - 7 x weekly - 3 sets - 10 reps - 3 seconds hold - Modified Thomas Stretch  - 1 x daily - 7 x weekly - 2 sets - 1 minute hold  Added 12/25/2021: - Seated Slump Nerve Glide  - 1 x daily - 7 x weekly - 3 sets - 20 reps   ASSESSMENT:   CLINICAL IMPRESSION: Upon re-assessment of objective measures and pt goals, he  has made excellent progress in BIL global hip strength, FOTO score, and pain levels. He continues to be limited in his walking endurance, which was a focus of today's treatment. He will continue to benefit from skilled PT to address his primary impairments and return to his prior level of function with less limitation.    OBJECTIVE IMPAIRMENTS Abnormal gait, decreased activity tolerance, decreased balance, decreased endurance, decreased knowledge of condition, decreased mobility, difficulty walking, decreased ROM, decreased strength, hypomobility, impaired flexibility, improper body mechanics, postural dysfunction, and pain.    ACTIVITY LIMITATIONS carrying, lifting, bending, sitting, standing, squatting, sleeping, stairs, transfers, and locomotion level   PARTICIPATION LIMITATIONS: cleaning, laundry, driving, shopping, community activity, and yard work   PERSONAL FACTORS Age, Past/current experiences, Time since onset of injury/illness/exacerbation, and 3+ comorbidities: See medical hx  are also affecting patient's functional outcome.        GOALS: Goals reviewed with patient? Yes   SHORT TERM GOALS: Target date: 12/27/2021   Pt will report understanding and adherence to initial HEP in order to promote independence in the management of primary impairments. Baseline: HEP provided at eval 01/22/2022: Pt reports adherence to his HEP Goal status: ACHIEVED     LONG TERM GOALS: Target date: 01/24/2022   Pt will achieve a FOTO score of 58% in order to demonstrate improved functional ability as it relates to his primary impairments. Baseline: 49% 01/22/2022: 55% Goal status: IN PROGRESS   2.  Pt will report the ability to walk 10 minutes without an AD in order to walk from the parking lot to his grandson's sports field with less limitation. Baseline: 2-3 minutes before requiring a seated rest 01/22/2022: 2-3 minutes without AD Goal status: IN PROGRESS   3.  Pt will achieve global BIL hip  strength of 4+/5 in order to progress his independent exercise  regimen with less limitation. Baseline: See MMT chart 01/22/2022: 4+/5 globally Goal status: ACHIEVED   4.  Pt will demonstrate understanding of management of AS including activity modification and appropriate exercises to progress his strength/ mobility. Baseline: Initial education at eval Goal status: INITIAL       PLAN: PT FREQUENCY: 1x/week   PT DURATION: 8 weeks   PLANNED INTERVENTIONS: Therapeutic exercises, Therapeutic activity, Neuromuscular re-education, Balance training, Gait training, Patient/Family education, Self Care, Joint mobilization, Stair training, Orthotic/Fit training, Aquatic Therapy, Dry Needling, Electrical stimulation, Spinal mobilization, Cryotherapy, Moist heat, Taping, Biofeedback, Ionotophoresis '4mg'$ /ml Dexamethasone, Manual therapy, and Re-evaluation.   PLAN FOR NEXT SESSION: Progress gentle mobility/ stretching, early strengthening, further education concerning activity modification for AS    Vanessa , PT, DPT 01/22/22 12:12 PM

## 2022-01-30 ENCOUNTER — Ambulatory Visit: Payer: Medicare Other | Attending: Sports Medicine

## 2022-01-30 DIAGNOSIS — M5459 Other low back pain: Secondary | ICD-10-CM | POA: Insufficient documentation

## 2022-01-30 DIAGNOSIS — R262 Difficulty in walking, not elsewhere classified: Secondary | ICD-10-CM | POA: Diagnosis not present

## 2022-01-30 DIAGNOSIS — M6281 Muscle weakness (generalized): Secondary | ICD-10-CM | POA: Diagnosis not present

## 2022-01-30 NOTE — Therapy (Signed)
OUTPATIENT PHYSICAL THERAPY TREATMENT NOTE/ DISCHARGE SUMMARY   Patient Name: Andrew Jones MRN: [id number] DOB:03-21-1941, 81 y.o., male Today's Date: 01/30/2022  PCP: Glennon Mac, DO REFERRING PROVIDER: Glennon Mac, DO  END OF SESSION:   PT End of Session - 01/30/22 0956     Visit Number 8    Number of Visits 9    Date for PT Re-Evaluation 01/31/22    Authorization Type BCBS    Authorization Time Period FOTO v6, v10, kx mod v15    Progress Note Due on Visit 10    PT Start Time 0958    PT Stop Time 1036    PT Time Calculation (min) 38 min    Activity Tolerance Patient tolerated treatment well    Behavior During Therapy WFL for tasks assessed/performed                Past Medical History:  Diagnosis Date   Ankylosing spondylitis ([place])    back, hips - on embrel 2000-2008, resumed 12/2012   CVA (cerebral vascular accident) ([place]) 1999, 2002   no residual problems   Diabetes mellitus, type 2 ([place])    Dyslipidemia    Hypertension    Osteoarthritis    Seizure disorder ([place]) 05/2009   Sleep apnea    uses c-pap   Past Surgical History:  Procedure Laterality Date   NO PAST SURGERIES     Patient Active Problem List   Diagnosis Date Noted   Diabetic mononeuropathy associated with diabetes mellitus due to underlying condition ([place]) 07/25/2021   Vitamin D deficiency 07/25/2021   Obesity 07/03/2020   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   Chronic kidney disease, stage III (moderate) ([place]) 07/03/2020   Low magnesium level 04/29/2019   Seborrheic keratosis 05/15/2018   Hearing loss 12/01/2017   BPH (benign prostatic hypertrophy) 12/20/2014   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[place]) 12/20/2014   PSA elevation 12/20/2014   Encounter for therapeutic drug monitoring 12/08/2012   Diabetes mellitus, type 2 ([place])    Seizure disorder ([facility])    Hypertension    Hyperlipidemia associated with type 2 diabetes mellitus ([place])     CVA (cerebral vascular accident) ([place])    Osteoarthritis     REFERRING DIAG:  M54.50,G89.29 (ICD-10-CM) - Chronic bilateral low back pain without sciatica  M45.6 (ICD-10-CM) - 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 He reports no pain at baseline, but 3-4/10 pain with walking/ standing. He also reports continued weakness in his thighs. Pt reports he feels that he is at his functional baseline and is ready for discharge from PT at this time.  PAIN:  Are you having pain? Yes: NPRS scale: 3-4/10 Pain location: low back Pain description: achy, stiff Aggravating factors: standing after prolonged sitting, walking >157f (2-3 minutes) Relieving factors: Diclofenac and pain cream   OBJECTIVE: (objective measures completed at initial evaluation unless otherwise dated)   DIAGNOSTIC FINDINGS:  11/20/2021: DG Lumbar Spine 2-3 Views: IMPRESSION: 1. Severe degenerative changes of the lumbar spine appears similar to the prior study. 2. 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  12/08/2021: MR Lumbar Spine WO Contrast: IMPRESSION: 1. 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 2. Moderate to severe neural foraminal stenosis at L2-L3 (left) and moderate at L3-L4 (bilateral).   PATIENT SURVEYS:  FOTO 49%, predicted 58% in 11 visits 01/22/2022: 55% 01/30/2022: 45%   SCREENING FOR RED FLAGS: Bowel or bladder incontinence: No Cauda equina  syndrome: No   COGNITION:           Overall cognitive status: Within functional limits for tasks assessed                          SENSATION: Not tested   MUSCLE LENGTH: Hamstring 90/90: Right 40 deg shy of full extension; Left 30 deg shy of full extension Thomas test: Moderate tightness BIL   POSTURE: rounded shoulders, forward head, decreased lumbar lordosis, and flexed trunk    PALPATION: No TTP in thoracic or lumbar paraspinals   PASSIVE ACCESSORY MOBILITY: Hypomobile CPAs throughout thoracic and lumbar spine   LUMBAR ROM:    Active  AROM  eval  Flexion 75%  Extension 25%  Right lateral flexion 50%  Left lateral flexion 50%  Right rotation 75%  Left rotation 75%   (Blank rows = not tested)     LOWER EXTREMITY MMT:     MMT Right eval Left eval Right 01/22/2022 Left 01/22/2022  Hip flexion 4/5 3+/5 4+/5 4+/5  Hip extension 3/5 3/5 4+/5 4+/5  Hip abduction 3+/5 3+/5 4+/5 4+/5  Knee flexion 5/5 5/5    Knee extension 5/5 5/5    Ankle dorsiflexion 5/5 5/5    Ankle plantarflexion 5/5 5/5     (Blank rows = not tested)     FUNCTIONAL TESTS:  Squat: WNL Plank: 48 seconds, terminated due to shoulder fatigue 5xSTS: 11 seconds   GAIT: Distance walked: 20 ft Assistive device utilized: None Level of assistance: Complete Independence Comments: Short stride length and forward flexed trunk       TODAY'S TREATMENT   [facility] Adult PT Treatment:                                                DATE: 01/30/2022 Therapeutic Exercise: Squat with UE support 3x10 DKTC stretch 3x30 seconds Standing Pallof press with blue band 2x10 with 5-sec hold BIL 2-inch lateral heel taps with UE support 2x10 BIL Manual Therapy: N/A Neuromuscular re-ed: N/A Therapeutic Activity: Re-administration of FOTO with pt education Update to HEP with pt education Pt education regarding management of AS, remaining symptoms, alternative options to address symptoms Modalities: N/A Self  Care: N/A   [facility] Adult PT Treatment:                                                DATE: 01/22/2022 Therapeutic Exercise: Marching Pallof press with 7# cable 3x10 marches BIL Romanian deadlift with 10# cable 3x10 2-inch lateral heel tap with 15# kettlebell 2x10 BIL Seated abdominal pressdown with 20# cable 3x10 Manual Therapy: N/A Neuromuscular re-ed: N/A Therapeutic Activity: Re-assessment of objective measures with pt education Re-administration of FOTO with pt educaiton Modalities: N/A Self Care:  N/A   [facility] Adult PT Treatment:                                                DATE: 01/09/2022 Therapeutic Exercise: Nustep level 6 x 5 mins Palloff press 10# cable 2x10 BIL Standing hip abduction 2x10 BIL Lumbar open book x10 BIL Seated sciatic nerve glide x20 BIL Seated piriformis stretch x50mn BIL Bridges 3" hold at top 2x10 Supine 90/90 isometric hold with handheld resistance on thighs 3x30" Dead bugs 3x10 with red pball Therapeutic Activity: Dead lift with 10# kettlebell 3x10 (15# KB was in use)      PATIENT EDUCATION:  Education details: Pt educated on probable underlying pathophysiology, POC, prognosis, FOTO, activity modification for AS, and HEP Person educated: Patient Education method: Explanation, Demonstration, and Handouts Education comprehension: verbalized understanding and returned demonstration     HOME EXERCISE PROGRAM: Access Code: [email or web address]: [email or web address] Date: 11/29/2021 Prepared by: TVanessa Vina  Exercises - Sidelying Open Book Thoracic Lumbar Rotation and Extension  - 1 x daily - 7 x weekly - 2 sets - 10 reps - 5 seconds hold - Supine Bridge  - 1 x daily - 7 x weekly - 3 sets - 10 reps - 3 seconds hold - Supine 90/90 Abdominal Bracing *with handhold resistance to thighs*  - 1 x daily - 7 x weekly - 3 sets - 30 seconds hold - Sidelying Hip Abduction  - 1 x daily - 7 x weekly - 3 sets - 10 reps - 3 seconds  hold - Modified Thomas Stretch  - 1 x daily - 7 x weekly - 2 sets - 1 minute hold  Added 12/25/2021: - Seated Slump Nerve Glide  - 1 x daily - 7 x weekly - 3 sets - 20 reps  Added 01/30/2022: - Supine Double Knee to Chest  - 1 x daily - 7 x weekly - 3 sets - 30 seconds hold - Squat with Counter Support  - 1 x daily - 7 x weekly - 3 sets - 10 reps   ASSESSMENT:   CLINICAL IMPRESSION: Upon re-assessment of pt goals, the pt has failed to make meaningful progress in functional walking ability or pain levels, although he has made good progress in global LE strength. Due to plateau in progress, as well as pt feeling he is at his baseline of function, he is discharged from PT at this time with an updated HEP.     OBJECTIVE IMPAIRMENTS Abnormal gait, decreased activity tolerance, decreased balance, decreased endurance, decreased knowledge of condition, decreased mobility, difficulty walking, decreased ROM, decreased strength, hypomobility, impaired flexibility, improper body mechanics, postural dysfunction, and pain.    ACTIVITY LIMITATIONS carrying, lifting, bending, sitting, standing, squatting, sleeping, stairs, transfers, and locomotion level   PARTICIPATION LIMITATIONS: cleaning, laundry, driving, shopping, community activity, and yard work   PERSONAL FACTORS Age, Past/current experiences, Time since onset of injury/illness/exacerbation, and 3+ comorbidities: See medical hx  are also affecting patient's functional outcome.        GOALS: Goals reviewed with patient? Yes   SHORT TERM GOALS: Target date: 12/27/2021   Pt will report understanding and adherence to initial HEP in order to promote independence in the management of primary impairments. Baseline: HEP provided at eval 01/22/2022: Pt reports adherence to his HEP Goal status: ACHIEVED     LONG TERM GOALS: Target  date: 01/24/2022   Pt will achieve a FOTO score of 58% in order to demonstrate improved functional ability as it  relates to his primary impairments. Baseline: 49% 01/22/2022: 55% 01/30/2022: 45% Goal status: NOT MET   2.  Pt will report the ability to walk 10 minutes without an AD in order to walk from the parking lot to his grandson's sports field with less limitation. Baseline: 2-3 minutes before requiring a seated rest 01/22/2022: 2-3 minutes without AD 01/30/2022: 2-3 minutes without AD Goal status: NOT MET   3.  Pt will achieve global BIL hip strength of 4+/5 in order to progress his independent exercise regimen with less limitation. Baseline: See MMT chart 01/22/2022: 4+/5 globally Goal status: ACHIEVED   4.  Pt will demonstrate understanding of management of AS including activity modification and appropriate exercises to progress his strength/ mobility. Baseline: Initial education at eval 01/30/2022: Pt reports understanding of the management of AS, including new HEP and activity modification Goal status: ACHIEVED       PLAN: PT FREQUENCY: 1x/week   PT DURATION: 8 weeks   PLANNED INTERVENTIONS: Therapeutic exercises, Therapeutic activity, Neuromuscular re-education, Balance training, Gait training, Patient/Family education, Self Care, Joint mobilization, Stair training, Orthotic/Fit training, Aquatic Therapy, Dry Needling, Electrical stimulation, Spinal mobilization, Cryotherapy, Moist heat, Taping, Biofeedback, Ionotophoresis 41m/ml Dexamethasone, Manual therapy, and Re-evaluation.   PLAN FOR NEXT SESSION: Pt is discharged from PT at this time.   PHYSICAL THERAPY DISCHARGE SUMMARY  Visits from Start of Care: 8  Current functional level related to goals / functional outcomes: Pt has made good progress in LE strength,    Remaining deficits: Pt remains functionally limited by pain in walking   Education / Equipment: Updated HEP   Patient agrees to discharge. Patient goals were partially met. Patient is being discharged due to maximized rehab potential.    YVanessa Juana Diaz  PT, DPT 01/30/22 10:39 AM

## 2022-02-11 ENCOUNTER — Telehealth: Payer: Self-pay | Admitting: Sports Medicine

## 2022-02-11 ENCOUNTER — Other Ambulatory Visit: Payer: Self-pay | Admitting: Sports Medicine

## 2022-02-11 DIAGNOSIS — M545 Low back pain, unspecified: Secondary | ICD-10-CM

## 2022-02-11 DIAGNOSIS — M456 Ankylosing spondylitis lumbar region: Secondary | ICD-10-CM

## 2022-02-11 DIAGNOSIS — F40240 Claustrophobia: Secondary | ICD-10-CM

## 2022-02-11 DIAGNOSIS — R29898 Other symptoms and signs involving the musculoskeletal system: Secondary | ICD-10-CM

## 2022-02-11 NOTE — Telephone Encounter (Signed)
Referral for epidural was placed

## 2022-02-11 NOTE — Telephone Encounter (Signed)
Patient called asking if another epidural could be ordered for him. His last one was 12/17/2021.

## 2022-02-18 ENCOUNTER — Ambulatory Visit
Admission: RE | Admit: 2022-02-18 | Discharge: 2022-02-18 | Disposition: A | Discharge status: Home / Self Care | Payer: Medicare Other | Source: Ambulatory Visit | Attending: Sports Medicine | Admitting: Sports Medicine

## 2022-02-18 DIAGNOSIS — R29898 Other symptoms and signs involving the musculoskeletal system: Secondary | ICD-10-CM

## 2022-02-18 DIAGNOSIS — F40240 Claustrophobia: Secondary | ICD-10-CM

## 2022-02-18 DIAGNOSIS — M47817 Spondylosis without myelopathy or radiculopathy, lumbosacral region: Secondary | ICD-10-CM | POA: Diagnosis not present

## 2022-02-18 DIAGNOSIS — M545 Low back pain, unspecified: Secondary | ICD-10-CM

## 2022-02-18 DIAGNOSIS — M456 Ankylosing spondylitis lumbar region: Secondary | ICD-10-CM

## 2022-02-18 MED ORDER — IOPAMIDOL (ISOVUE-M 200) INJECTION 41%
1.0000 mL | Freq: Once | INTRAMUSCULAR | Status: AC
  Administered 2022-02-18: 1 mL via EPIDURAL

## 2022-02-18 MED ORDER — METHYLPREDNISOLONE ACETATE 40 MG/ML INJ SUSP (RADIOLOG
80.0000 mg | Freq: Once | INTRAMUSCULAR | Status: AC
  Administered 2022-02-18: 80 mg via EPIDURAL

## 2022-02-18 NOTE — Discharge Instructions (Signed)

## 2022-03-29 ENCOUNTER — Ambulatory Visit (INDEPENDENT_AMBULATORY_CARE_PROVIDER_SITE_OTHER): Payer: Medicare Other

## 2022-03-29 VITALS — Ht 68.0 in | Wt 187.0 lb

## 2022-03-29 DIAGNOSIS — Z0000 Encounter for general adult medical examination without abnormal findings: Secondary | ICD-10-CM

## 2022-03-29 NOTE — Patient Instructions (Signed)
[name] , Thank you for taking time to come for your Medicare Wellness Visit. I appreciate your ongoing commitment to your health goals. Please review the following plan we discussed and let me know if I can assist you in the future.   These are the goals we discussed:  Goals      Patient Stated     Continue healthy eating & exercising     Patient Stated     03/29/2022, start walking again        This is a list of the screening recommended for you and due dates:  Health Maintenance  Topic Date Due   Eye exam for diabetics  11/27/2017   Zoster (Shingles) Vaccine (2 of 2) 03/07/2021   DTaP/Tdap/Td vaccine (3 - Td or Tdap) 09/15/2021   COVID-19 Vaccine (8 - 2023-24 season) 02/21/2022   Hemoglobin A1C  07/12/2022   Complete foot exam   07/26/2022   Yearly kidney function blood test for diabetes  01/11/2023   Yearly kidney health urinalysis for diabetes  01/11/2023   Medicare Annual Wellness Visit  03/30/2023   Pneumonia Vaccine  Completed   Flu Shot  Completed   Hepatitis C Screening: USPSTF Recommendation to screen - Ages 18-79 yo.  Completed   HPV Vaccine  Aged Out   Colon Cancer Screening  Discontinued    Advanced directives: Please bring a copy of your POA (Power of Attorney) and/or Living Will to your next appointment.   Conditions/risks identified: none  Next appointment: Follow up in one year for your annual wellness visit.   Preventive Care 76 Years and Older, Male  Preventive care refers to lifestyle choices and visits with your health care provider that can promote health and wellness. What does preventive care include? A yearly physical exam. This is also called an annual well check. Dental exams once or twice a year. Routine eye exams. Ask your health care provider how often you should have your eyes checked. Personal lifestyle choices, including: Daily care of your teeth and gums. Regular physical activity. Eating a healthy diet. Avoiding tobacco and drug  use. Limiting alcohol use. Practicing safe sex. Taking low doses of aspirin every day. Taking vitamin and mineral supplements as recommended by your health care provider. What happens during an annual well check? The services and screenings done by your health care provider during your annual well check will depend on your age, overall health, lifestyle risk factors, and family history of disease. Counseling  Your health care provider may ask you questions about your: Alcohol use. Tobacco use. Drug use. Emotional well-being. Home and relationship well-being. Sexual activity. Eating habits. History of falls. Memory and ability to understand (cognition). Work and work Statistician. Screening  You may have the following tests or measurements: Height, weight, and BMI. Blood pressure. Lipid and cholesterol levels. These may be checked every 5 years, or more frequently if you are over 17 years old. Skin check. Lung cancer screening. You may have this screening every year starting at age 1 if you have a 30-pack-year history of smoking and currently smoke or have quit within the past 15 years. Fecal occult blood test (FOBT) of the stool. You may have this test every year starting at age 84. Flexible sigmoidoscopy or colonoscopy. You may have a sigmoidoscopy every 5 years or a colonoscopy every 10 years starting at age 82. Prostate cancer screening. Recommendations will vary depending on your family history and other risks. Hepatitis C blood test. Hepatitis B blood  test. Sexually transmitted disease (STD) testing. Diabetes screening. This is done by checking your blood sugar (glucose) after you have not eaten for a while (fasting). You may have this done every 1-3 years. Abdominal aortic aneurysm (AAA) screening. You may need this if you are a current or former smoker. Osteoporosis. You may be screened starting at age 13 if you are at high risk. Talk with your health care provider about  your test results, treatment options, and if necessary, the need for more tests. Vaccines  Your health care provider may recommend certain vaccines, such as: Influenza vaccine. This is recommended every year. Tetanus, diphtheria, and acellular pertussis (Tdap, Td) vaccine. You may need a Td booster every 10 years. Zoster vaccine. You may need this after age 64. Pneumococcal 13-valent conjugate (PCV13) vaccine. One dose is recommended after age 52. Pneumococcal polysaccharide (PPSV23) vaccine. One dose is recommended after age 73. Talk to your health care provider about which screenings and vaccines you need and how often you need them. This information is not intended to replace advice given to you by your health care provider. Make sure you discuss any questions you have with your health care provider. Document Released: 04/07/2015 Document Revised: 11/29/2015 Document Reviewed: 01/10/2015 Elsevier Interactive Patient Education  2017 [place] Prevention in the Home Falls can cause injuries. They can happen to people of all ages. There are many things you can do to make your home safe and to help prevent falls. What can I do on the outside of my home? Regularly fix the edges of walkways and driveways and fix any cracks. Remove anything that might make you trip as you walk through a door, such as a raised step or threshold. Trim any bushes or trees on the path to your home. Use bright outdoor lighting. Clear any walking paths of anything that might make someone trip, such as rocks or tools. Regularly check to see if handrails are loose or broken. Make sure that both sides of any steps have handrails. Any raised decks and porches should have guardrails on the edges. Have any leaves, snow, or ice cleared regularly. Use sand or salt on walking paths during winter. Clean up any spills in your garage right away. This includes oil or grease spills. What can I do in the bathroom? Use  night lights. Install grab bars by the toilet and in the tub and shower. Do not use towel bars as grab bars. Use non-skid mats or decals in the tub or shower. If you need to sit down in the shower, use a plastic, non-slip stool. Keep the floor dry. Clean up any water that spills on the floor as soon as it happens. Remove soap buildup in the tub or shower regularly. Attach bath mats securely with double-sided non-slip rug tape. Do not have throw rugs and other things on the floor that can make you trip. What can I do in the bedroom? Use night lights. Make sure that you have a light by your bed that is easy to reach. Do not use any sheets or blankets that are too big for your bed. They should not hang down onto the floor. Have a firm chair that has side arms. You can use this for support while you get dressed. Do not have throw rugs and other things on the floor that can make you trip. What can I do in the kitchen? Clean up any spills right away. Avoid walking on wet floors. Keep items that  you use a lot in easy-to-reach places. If you need to reach something above you, use a strong step stool that has a grab bar. Keep electrical cords out of the way. Do not use floor polish or wax that makes floors slippery. If you must use wax, use non-skid floor wax. Do not have throw rugs and other things on the floor that can make you trip. What can I do with my stairs? Do not leave any items on the stairs. Make sure that there are handrails on both sides of the stairs and use them. Fix handrails that are broken or loose. Make sure that handrails are as long as the stairways. Check any carpeting to make sure that it is firmly attached to the stairs. Fix any carpet that is loose or worn. Avoid having throw rugs at the top or bottom of the stairs. If you do have throw rugs, attach them to the floor with carpet tape. Make sure that you have a light switch at the top of the stairs and the bottom of the  stairs. If you do not have them, ask someone to add them for you. What else can I do to help prevent falls? Wear shoes that: Do not have high heels. Have rubber bottoms. Are comfortable and fit you well. Are closed at the toe. Do not wear sandals. If you use a stepladder: Make sure that it is fully opened. Do not climb a closed stepladder. Make sure that both sides of the stepladder are locked into place. Ask someone to hold it for you, if possible. Clearly mark and make sure that you can see: Any grab bars or handrails. First and last steps. Where the edge of each step is. Use tools that help you move around (mobility aids) if they are needed. These include: Canes. Walkers. Scooters. Crutches. Turn on the lights when you go into a dark area. Replace any light bulbs as soon as they burn out. Set up your furniture so you have a clear path. Avoid moving your furniture around. If any of your floors are uneven, fix them. If there are any pets around you, be aware of where they are. Review your medicines with your doctor. Some medicines can make you feel dizzy. This can increase your chance of falling. Ask your doctor what other things that you can do to help prevent falls. This information is not intended to replace advice given to you by your health care provider. Make sure you discuss any questions you have with your health care provider. Document Released: 01/05/2009 Document Revised: 08/17/2015 Document Reviewed: 04/15/2014 Elsevier Interactive Patient Education  2017 [employer].

## 2022-03-29 NOTE — Progress Notes (Signed)
I connected with Andrew Jones today by telephone and verified that I am speaking with the correct person using two identifiers. Location patient: home Location provider: work Persons participating in the virtual visit: Carleton [place], Glenna Durand LPN.   I discussed the limitations, risks, security and privacy concerns of performing an evaluation and management service by telephone and the availability of in person appointments. I also discussed with the patient that there may be a patient responsible charge related to this service. The patient expressed understanding and verbally consented to this telephonic visit.    Interactive audio and video telecommunications were attempted between this provider and patient, however failed, due to patient having technical difficulties OR patient did not have access to video capability.  We continued and completed visit with audio only.     Vital signs may be patient reported or missing.  Subjective:   Andrew Jones is a 82 y.o. male who presents for Medicare Annual/Subsequent preventive examination.  Review of Systems     Cardiac Risk Factors include: advanced age (>36mn, >>38women);diabetes mellitus;hypertension;male gender     Objective:    Today's Vitals   03/29/22 1611  Weight: 187 lb (84.8 kg)  Height: '5\' 8"'$  (1.727 m)   Body mass index is 28.43 kg/m.     03/29/2022    4:17 PM 11/29/2021   11:30 AM 03/28/2021    3:15 PM 11/22/2020    2:52 PM 12/09/2019    3:50 PM 11/01/2019   10:09 PM 12/20/2014    8:46 AM  Advanced Directives  Does Patient Have a Medical Advance Directive? Yes Yes Yes Yes Yes No No  Type of AParamedicof AH[place]Living will [place] will [place] Rock[place] will Living will;Healthcare Power of [place]Living will  Living will;Healthcare Power of Attorney  Does patient want to make changes to medical advance directive?  No -  Patient declined  No - Patient declined     Copy of [place] Chart? No - copy requested Yes - validated most recent copy scanned in chart (See row information) No - copy requested No - copy requested No - copy requested  Yes  Would patient like information on creating a medical advance directive?      No - Patient declined No - patient declined information    Current Medications (verified) Outpatient Encounter Medications as of 03/29/2022  Medication Sig   Alirocumab 75 MG/ML SOAJ Inject 75 mg into the skin every 14 (fourteen) days.   amLODipine (NORVASC) 10 MG tablet Take 1 tablet (10 mg total) by mouth daily.   aspirin EC 81 MG tablet Take 1 tablet (81 mg total) by mouth daily. Swallow whole.   benazepril (LOTENSIN) 10 MG tablet Take 10 mg by mouth daily.   Cholecalciferol (VITAMIN D3) 125 MCG (5000 UT) CAPS Take 1 capsule (5,000 Units total) by mouth daily.   empagliflozin (JARDIANCE) 25 MG TABS tablet Take 1 tablet (25 mg total) by mouth daily.   etanercept (ENBREL) 50 MG/ML injection Inject 50 mg into the skin once a week.   fish oil-omega-3 fatty acids 1000 MG capsule Take 2 g by mouth daily.   LORazepam (ATIVAN) 0.5 MG tablet 1-2 tabs 30 - 60 min prior to MRI. Do not drive with this medicine.   Magnesium Oxide 420 MG TABS Take 1 tablet (420 mg total) by mouth daily after breakfast.   metFORMIN (GLUCOPHAGE) 1000 MG tablet Take 1,000  mg by mouth 2 (two) times daily with a meal.   metoprolol tartrate (LOPRESSOR) 25 MG tablet Take 25 mg by mouth 2 (two) times daily.   Multiple Vitamin (MULTIVITAMIN) tablet Take 1 tablet by mouth daily.   omeprazole (PRILOSEC) 20 MG capsule Take 20 mg by mouth daily.   RESTASIS 0.05 % ophthalmic emulsion Place 1 drop into both eyes 2 (two) times daily.   ezetimibe (ZETIA) 10 MG tablet Take 1 tablet (10 mg total) by mouth daily. (Patient not taking: Reported on 03/29/2022)   No facility-administered encounter medications on file as of  03/29/2022.    Allergies (verified) Patient has no known allergies.   History: Past Medical History:  Diagnosis Date   Ankylosing spondylitis ([place])    back, hips - on embrel 2000-2008, resumed 12/2012   CVA (cerebral vascular accident) ([place]) 1999, 2002   no residual problems   Diabetes mellitus, type 2 (HCC)    Dyslipidemia    Hypertension    Osteoarthritis    Seizure disorder ([place]) 05/2009   Sleep apnea    uses c-pap   Past Surgical History:  Procedure Laterality Date   NO PAST SURGERIES     Family History  Problem Relation Age of Onset   Heart failure Mother    Heart disease Mother    COPD Father    Prostate cancer Brother    Social History   Socioeconomic History   Marital status: Married    Spouse name: Not on file   Number of children: 1   Years of education: 13   Highest education level: Not on file  Occupational History   Occupation: Retired  Tobacco Use   Smoking status: Former   Smokeless tobacco: Never   Tobacco comments:    30 yrs ago  Scientific laboratory technician Use: Never used  Substance and Sexual Activity   Alcohol use: Not Currently   Drug use: No   Sexual activity: Yes  Other Topics Concern   Not on file  Social History Narrative   Lives with wife   Right Handed   Drinks 2-3 cups caffeine daily   Social Determinants of Health   Financial Resource Strain: Low Risk  (03/29/2022)   Overall Financial Resource Strain (CARDIA)    Difficulty of Paying Living Expenses: Not hard at all  Food Insecurity: No Food Insecurity (03/29/2022)   Hunger Vital Sign    Worried About Running Out of Food in the Last Year: Never true    [place] in the Last Year: Never true  Transportation Needs: No Transportation Needs (03/29/2022)   PRAPARE - Hydrologist (Medical): No    Lack of Transportation (Non-Medical): No  Physical Activity: Inactive (03/29/2022)   Exercise Vital Sign    Days of Exercise per Week: 0 days    Minutes of  Exercise per Session: 0 min  Stress: No Stress Concern Present (03/29/2022)   [place]    Feeling of Stress : Not at all  Social Connections: Moderately Isolated (03/28/2021)   Social Connection and Isolation Panel [NHANES]    Frequency of Communication with Friends and Family: Twice a week    Frequency of Social Gatherings with Friends and Family: Twice a week    Attends Religious Services: Never    Marine scientist or Organizations: No    Attends Archivist Meetings: Never    Marital Status: Married  Tobacco Counseling Counseling given: Not Answered Tobacco comments: 30 yrs ago   Clinical Intake:  Pre-visit preparation completed: Yes  Pain : No/denies pain     Nutritional Status: BMI 25 -29 Overweight Nutritional Risks: None Diabetes: Yes  How often do you need to have someone help you when you read instructions, pamphlets, or other written materials from your doctor or pharmacy?: 1 - Never  Diabetic? Yes Nutrition Risk Assessment:  Has the patient had any N/V/D within the last 2 months?  No  Does the patient have any non-healing wounds?  No  Has the patient had any unintentional weight loss or weight gain?  No   Diabetes:  Is the patient diabetic?  Yes  If diabetic, was a CBG obtained today?  No  Did the patient bring in their glucometer from home?  No  How often do you monitor your CBG's? Does not.   Financial Strains and Diabetes Management:  Are you having any financial strains with the device, your supplies or your medication? No .  Does the patient want to be seen by Chronic Care Management for management of their diabetes?  No  Would the patient like to be referred to a Nutritionist or for Diabetic Management?  No   Diabetic Exams:  Diabetic Eye Exam: Overdue for diabetic eye exam. Pt has been advised about the importance in completing this exam. Patient advised to call  and schedule an eye exam. Diabetic Foot Exam: Completed 07/25/2021   Interpreter Needed?: No  Information entered by :: NAllen LPN   Activities of Daily Living    03/29/2022    4:18 PM  In your present state of health, do you have any difficulty performing the following activities:  Hearing? 0  Comment has hearing aids  Vision? 0  Difficulty concentrating or making decisions? 0  Walking or climbing stairs? 1  Dressing or bathing? 0  Doing errands, shopping? 0  Preparing Food and eating ? N  Using the Toilet? N  In the past six months, have you accidently leaked urine? N  Do you have problems with loss of bowel control? N  Managing your Medications? N  Managing your Finances? N  Housekeeping or managing your Housekeeping? N    Patient Care Team: [place], Charlene Brooke, NP as PCP - General (Internal Medicine) Garvin Fila, MD (Neurology) [place], Heather, [place] (Optometry) Irene Shipper, MD (Gastroenterology)  Indicate any recent Medical Services you may have received from other than [facility] providers in the past year (date may be approximate).     Assessment:   This is a routine wellness examination for Andrew Jones.  Hearing/Vision screen Vision Screening - Comments:: Regular eye exams, Dr. [place]  Dietary issues and exercise activities discussed: Current Exercise Habits: The patient does not participate in regular exercise at present   Goals Addressed             This Visit's Progress    Patient Stated       03/29/2022, start walking again       Depression Screen    03/29/2022    4:18 PM 07/25/2021    9:06 AM 03/28/2021    3:21 PM 03/28/2021    3:07 PM 12/09/2019    3:53 PM 04/27/2019    1:24 PM 07/02/2017    8:56 AM  PHQ 2/9 Scores  PHQ - 2 Score 0 0 0 0 0 0 0  PHQ- 9 Score  1         Fall  Risk    03/29/2022    4:17 PM 03/28/2021    3:18 PM 03/28/2021    2:37 PM 12/09/2019    3:52 PM 04/27/2019    1:24 PM  [place] in the past year? 0 0 0 0 0  Number falls in past  yr: 0 0 0 0   Injury with Fall? 0 0 0 0   Risk for fall due to : Medication side effect;Impaired balance/gait;Impaired mobility      Follow up Falls prevention discussed;Falls evaluation completed;Education provided Falls evaluation completed  Falls prevention discussed     FALL RISK PREVENTION PERTAINING TO THE HOME:  Any stairs in or around the home? No  If so, are there any without handrails? N/a Home free of loose throw rugs in walkways, pet beds, electrical cords, etc? No  Adequate lighting in your home to reduce risk of falls? No   ASSISTIVE DEVICES UTILIZED TO PREVENT FALLS:  Life alert? Yes  Use of a cane, walker or w/c? Yes  Grab bars in the bathroom? Yes  Shower chair or bench in shower? Yes  Elevated toilet seat or a handicapped toilet? Yes   TIMED UP AND GO:  Was the test performed? No .       Cognitive Function:        03/29/2022    4:19 PM  6CIT Screen  What Year? 0 points  What month? 0 points  What time? 0 points  Count back from 20 0 points  Months in reverse 0 points  Repeat phrase 0 points  Total Score 0 points    Immunizations Immunization History  Administered Date(s) Administered   Fluad Quad(high Dose 65+) 12/23/2018, 12/26/2020   Influenza Split 12/24/2011, 12/24/2014, 12/23/2016   Influenza, High Dose Seasonal PF 12/23/2012, 12/25/2013, 12/20/2014, 12/28/2016, 11/30/2017   Influenza-Unspecified 12/24/2003, 12/23/2004, 12/23/2005, 01/24/2007, 12/24/2007, 01/23/2009, 07/17/2010, 12/24/2010, 01/08/2012, 01/07/2019, 01/07/2020, 12/26/2020   Moderna Sars-Covid-2 Vaccination 04/06/2019, 04/27/2019, 05/04/2019, 05/18/2019, 12/27/2019, 09/07/2020   PPD Test 08/14/2021   Pneumococcal Conjugate-13 05/23/2008, 10/26/2013   Pneumococcal Polysaccharide-23 10/23/2012   Pneumococcal-Unspecified 02/24/2004, 11/01/2013   Tdap 06/29/2011, 09/16/2011   Zoster Recombinat (Shingrix) 01/10/2021   Zoster, Live 10/23/2012, 03/26/2015    TDAP status: Due,  Education has been provided regarding the importance of this vaccine. Advised may receive this vaccine at local pharmacy or Health Dept. Aware to provide a copy of the vaccination record if obtained from local pharmacy or Health Dept. Verbalized acceptance and understanding.  Flu Vaccine status: Up to date  Pneumococcal vaccine status: Up to date  Covid-19 vaccine status: Completed vaccines  Qualifies for Shingles Vaccine? Yes   Zostavax completed Yes   Shingrix Completed?: needs second dose  Screening Tests Health Maintenance  Topic Date Due   OPHTHALMOLOGY EXAM  11/27/2017   Zoster Vaccines- Shingrix (2 of 2) 03/07/2021   DTaP/Tdap/Td (3 - Td or Tdap) 09/15/2021   COVID-19 Vaccine (8 - 2023-24 season) 02/21/2022   Medicare Annual Wellness (AWV)  03/28/2022   HEMOGLOBIN A1C  07/12/2022   FOOT EXAM  07/26/2022   Diabetic kidney evaluation - eGFR measurement  01/11/2023   Diabetic kidney evaluation - Urine ACR  01/11/2023   Pneumonia Vaccine 29+ Years old  Completed   INFLUENZA VACCINE  Completed   Hepatitis C Screening  Completed   HPV VACCINES  Aged Out   COLONOSCOPY (Pts 45-42yr Insurance coverage will need to be confirmed)  Discontinued    Health Maintenance  Health Maintenance Due  Topic Date Due   OPHTHALMOLOGY EXAM  11/27/2017   Zoster Vaccines- Shingrix (2 of 2) 03/07/2021   DTaP/Tdap/Td (3 - Td or Tdap) 09/15/2021   COVID-19 Vaccine (8 - 2023-24 season) 02/21/2022   Medicare Annual Wellness (AWV)  03/28/2022    Colorectal cancer screening: No longer required.   Lung Cancer Screening: (Low Dose CT Chest recommended if Age 57-80 years, 30 pack-year currently smoking OR have quit w/in 15years.) does not qualify.   Lung Cancer Screening Referral: no  Additional Screening:  Hepatitis C Screening: does qualify; Completed 11/10/2019  Vision Screening: Recommended annual ophthalmology exams for early detection of glaucoma and other disorders of the eye. Is the  patient up to date with their annual eye exam?  Yes  Who is the provider or what is the name of the office in which the patient attends annual eye exams? Dr. [place] If pt is not established with a provider, would they like to be referred to a provider to establish care? No .   Dental Screening: Recommended annual dental exams for proper oral hygiene  Community Resource Referral / Chronic Care Management: CRR required this visit?  No   CCM required this visit?  No      Plan:     I have personally reviewed and noted the following in the patient's chart:   Medical and social history Use of alcohol, tobacco or illicit drugs  Current medications and supplements including opioid prescriptions. Patient is not currently taking opioid prescriptions. Functional ability and status Nutritional status Physical activity Advanced directives List of other physicians Hospitalizations, surgeries, and ER visits in previous 12 months Vitals Screenings to include cognitive, depression, and falls Referrals and appointments  In addition, I have reviewed and discussed with patient certain preventive protocols, quality metrics, and best practice recommendations. A written personalized care plan for preventive services as well as general preventive health recommendations were provided to patient.     Kellie Simmering, LPN   05/01/7410   Nurse Notes: none  Due to this being a virtual visit, the after visit summary with patients personalized plan was offered to patient via mail or my-chart. Patient would like to access on my-chart

## 2022-04-03 ENCOUNTER — Ambulatory Visit: Payer: Medicare Other | Admitting: Nurse Practitioner

## 2022-04-03 ENCOUNTER — Encounter: Payer: Self-pay | Admitting: Nurse Practitioner

## 2022-04-03 VITALS — BP 116/74 | HR 70 | Temp 98.1°F | Resp 18 | Ht 68.0 in | Wt 189.4 lb

## 2022-04-03 DIAGNOSIS — M5412 Radiculopathy, cervical region: Secondary | ICD-10-CM

## 2022-04-03 DIAGNOSIS — G629 Polyneuropathy, unspecified: Secondary | ICD-10-CM

## 2022-04-03 DIAGNOSIS — E785 Hyperlipidemia, unspecified: Secondary | ICD-10-CM | POA: Insufficient documentation

## 2022-04-03 DIAGNOSIS — E119 Type 2 diabetes mellitus without complications: Secondary | ICD-10-CM | POA: Insufficient documentation

## 2022-04-03 DIAGNOSIS — E1169 Type 2 diabetes mellitus with other specified complication: Secondary | ICD-10-CM | POA: Insufficient documentation

## 2022-04-03 DIAGNOSIS — K219 Gastro-esophageal reflux disease without esophagitis: Secondary | ICD-10-CM | POA: Insufficient documentation

## 2022-04-03 MED ORDER — GABAPENTIN 100 MG PO CAPS: ORAL_CAPSULE | ORAL | 5 refills | Status: DC

## 2022-04-03 NOTE — Assessment & Plan Note (Signed)
>>  ASSESSMENT AND PLAN FOR NEUROPATHY WRITTEN ON 04/03/2022  4:26 PM BY Byron Tipping LUM, NP  Onset of numbness in feet over 1year ago Onset of numbness in hands 6months ago. Has difficulty with fine motor skills. MRI lumbar spine: spinal stenosis Epidural injection administered by sports medicine ago. Also started outpatient PT. hgbA1c at 8.1% No ETOH use. Normal TSH No muscle atrophy on bilateral UE, normal and equal strength. Normal neck ROM. Sent gabapentin 100mg  (titrate slowly) Advised about risk of sedation and fall risk. F/up in 74month Order MRI cervical spine, B12 and folate if no improvement

## 2022-04-03 NOTE — Assessment & Plan Note (Addendum)
Onset of numbness in feet over 1year ago Onset of numbness in hands 46month ago. Has difficulty with fine motor skills. MRI lumbar spine: spinal stenosis Epidural injection administered by sports medicine 237mhs ago. Also started outpatient PT. hgbA1c at 8.1% No ETOH use. Normal TSH No muscle atrophy on bilateral UE, normal and equal strength. Normal neck ROM. Sent gabapentin '100mg'$  (titrate slowly) Advised about risk of sedation and fall risk. F/up in 42m5monthder MRI cervical spine, B12 and folate if no improvement

## 2022-04-03 NOTE — Progress Notes (Signed)
Established Patient Visit  Patient: Andrew Jones   DOB: [birth year]   82 y.o. Male  MRN: [id number] Visit Date: 04/03/2022  Subjective:    Chief Complaint  Patient presents with   Numbness    Pt states having numbness in hands and feet. Pt states sxs have been getting worse. Pt states having a recent dx of spinal stenosis from sports med   HPI Neuropathy Onset of numbness in feet over 1year ago Onset of numbness in hands 95month ago. Has difficulty with fine motor skills. MRI lumbar spine: spinal stenosis Epidural injection administered by sports medicine 274mhs ago. Also started outpatient PT. hgbA1c at 8.1% No ETOH use. Normal TSH No muscle atrophy on bilateral UE, normal and equal strength. Normal neck ROM. Sent gabapentin '100mg'$  (titrate slowly) Advised about risk of sedation and fall risk. F/up in 76m17monthder MRI cervical spine, B12 and folate if no improvement  Reviewed medical, surgical, and social history today  Medications: Outpatient Medications Prior to Visit  Medication Sig   Alirocumab 75 MG/ML SOAJ Inject 75 mg into the skin every 14 (fourteen) days.   amLODipine (NORVASC) 10 MG tablet Take 1 tablet (10 mg total) by mouth daily.   aspirin EC 81 MG tablet Take 1 tablet (81 mg total) by mouth daily. Swallow whole.   benazepril (LOTENSIN) 10 MG tablet Take 10 mg by mouth daily.   Cholecalciferol (VITAMIN D3) 125 MCG (5000 UT) CAPS Take 1 capsule (5,000 Units total) by mouth daily.   empagliflozin (JARDIANCE) 25 MG TABS tablet Take 1 tablet (25 mg total) by mouth daily.   etanercept (ENBREL) 50 MG/ML injection Inject 50 mg into the skin once a week.   fish oil-omega-3 fatty acids 1000 MG capsule Take 2 g by mouth daily.   LORazepam (ATIVAN) 0.5 MG tablet 1-2 tabs 30 - 60 min prior to MRI. Do not drive with this medicine.   Magnesium Oxide 420 MG TABS Take 1 tablet (420 mg total) by mouth daily after breakfast.   metFORMIN (GLUCOPHAGE) 1000 MG tablet  Take 1,000 mg by mouth 2 (two) times daily with a meal.   metoprolol tartrate (LOPRESSOR) 25 MG tablet Take 25 mg by mouth 2 (two) times daily.   Multiple Vitamin (MULTIVITAMIN) tablet Take 1 tablet by mouth daily.   omeprazole (PRILOSEC) 20 MG capsule Take 20 mg by mouth daily.   RESTASIS 0.05 % ophthalmic emulsion Place 1 drop into both eyes 2 (two) times daily.   [DISCONTINUED] ezetimibe (ZETIA) 10 MG tablet Take 1 tablet (10 mg total) by mouth daily. (Patient not taking: Reported on 03/29/2022)   No facility-administered medications prior to visit.   Reviewed past medical and social history.   ROS per HPI above  Last CBC Lab Results  Component Value Date   WBC 10.4 01/10/2022   HGB 12.3 (A) 01/10/2022   HCT 38 (A) 01/10/2022   MCV 85.0 06/20/2021   MCH 28.9 06/20/2021   RDW 13.2 06/20/2021   PLT 280 01/09/99/1749Last metabolic panel Lab Results  Component Value Date   GLUCOSE 198 (H) 06/20/2021   NA 136 (A) 01/10/2022   K 5.6 (A) 01/10/2022   CL 105 01/10/2022   CO2 27 (A) 01/10/2022   BUN 17 06/20/2021   CREATININE 1.0 06/25/2021   EGFR 70 01/10/2022   CALCIUM 9.8 01/10/2022   PHOS 3.8 07/03/2020   PROT 6.6 07/03/2020  ALBUMIN 3.8 01/10/2022   BILITOT 0.4 07/03/2020   ALKPHOS 76 01/10/2022   AST 19 01/10/2022   ALT 48 (A) 01/10/2022   ANIONGAP 9 06/20/2021   Last hemoglobin A1c Lab Results  Component Value Date   HGBA1C 8.1 01/10/2022   Last thyroid functions Lab Results  Component Value Date   TSH 3.03 07/03/2020   Last vitamin D Lab Results  Component Value Date   VD25OH 68.56 01/10/2022   Last vitamin B12 and Folate No results found for: "VITAMINB12", "FOLATE"      Objective:  BP 116/74 (BP Location: Right Arm, Patient Position: Sitting, Cuff Size: Normal)   Pulse 70   Temp 98.1 F (36.7 C) (Oral)   Resp 18   Ht '5\' 8"'$  (1.727 m)   Wt 189 lb 6.4 oz (85.9 kg)   SpO2 99%   BMI 28.80 kg/m      Physical Exam Cardiovascular:     Rate and  Rhythm: Normal rate.     Pulses: Normal pulses.  Pulmonary:     Effort: Pulmonary effort is normal.  Musculoskeletal:        General: No swelling or tenderness.     Right shoulder: Normal.     Left shoulder: Normal.     Right upper arm: Normal.     Left upper arm: Normal.     Right elbow: Normal.     Left elbow: Normal.     Right forearm: Normal.     Left forearm: Normal.     Right wrist: Normal.     Left wrist: Normal.     Right hand: No swelling or tenderness. Normal range of motion. Normal strength. There is no disruption of two-point discrimination. Normal capillary refill. Normal pulse.     Left hand: No swelling, tenderness or bony tenderness. Normal range of motion. Normal strength. Normal capillary refill. Normal pulse.     Cervical back: Normal, normal range of motion and neck supple. No tenderness.     Right lower leg: No edema.     Left lower leg: No edema.  Lymphadenopathy:     Cervical: No cervical adenopathy.  Neurological:     Mental Status: He is oriented to person, place, and time.     No results found for any visits on 04/03/22.    Assessment & Plan:    Problem List Items Addressed This Visit       Nervous and Auditory   Neuropathy - Primary    Onset of numbness in feet over 1year ago Onset of numbness in hands 60month ago. Has difficulty with fine motor skills. MRI lumbar spine: spinal stenosis Epidural injection administered by sports medicine 259mhs ago. Also started outpatient PT. hgbA1c at 8.1% No ETOH use. Normal TSH No muscle atrophy on bilateral UE, normal and equal strength. Normal neck ROM. Sent gabapentin '100mg'$  (titrate slowly) Advised about risk of sedation and fall risk. F/up in 85m43monthder MRI cervical spine, B12 and folate if no improvement      Relevant Medications   gabapentin (NEURONTIN) 100 MG capsule   Other Visit Diagnoses     Cervical radiculopathy       Relevant Medications   gabapentin (NEURONTIN) 100 MG capsule       Return in about 4 weeks (around 05/01/2022) for neuropathy.     ChaWilfred LacyP

## 2022-04-03 NOTE — Patient Instructions (Signed)
Start gabapentin '100mg'$  at bedtime x3days, then 1cap in Am and at bedtime x3days, then 1cap in AM and 2caps at bedtime continuously  May causes drowsiness.  Gabapentin Capsules or Tablets What is this medication? GABAPENTIN (GA ba pen tin) treats nerve pain. It may also be used to prevent and control seizures in people with epilepsy. It works by calming overactive nerves in your body. This medicine may be used for other purposes; ask your health care provider or pharmacist if you have questions. COMMON BRAND NAME(S): Active-PAC with Gabapentin, Orpha Bur, Gralise, Neurontin What should I tell my care team before I take this medication? They need to know if you have any of these conditions: Alcohol or substance use disorder Kidney disease Lung or breathing disease Suicidal thoughts, plans, or attempt; a previous suicide attempt by you or a family member An unusual or allergic reaction to gabapentin, other medications, foods, dyes, or preservatives Pregnant or trying to get pregnant Breast-feeding How should I use this medication? Take this medication by mouth with a glass of water. Follow the directions on the prescription label. You can take it with or without food. If it upsets your stomach, take it with food. Take your medication at regular intervals. Do not take it more often than directed. Do not stop taking except on your care team's advice. If you are directed to break the 600 or 800 mg tablets in half as part of your dose, the extra half tablet should be used for the next dose. If you have not used the extra half tablet within 28 days, it should be thrown away. A special MedGuide will be given to you by the pharmacist with each prescription and refill. Be sure to read this information carefully each time. Talk to your care team about the use of this medication in children. While this medication may be prescribed for children as young as 3 years for selected conditions, precautions do  apply. Overdosage: If you think you have taken too much of this medicine contact a poison control center or emergency room at once. NOTE: This medicine is only for you. Do not share this medicine with others. What if I miss a dose? If you miss a dose, take it as soon as you can. If it is almost time for your next dose, take only that dose. Do not take double or extra doses. What may interact with this medication? Alcohol Antihistamines for allergy, cough, and cold Certain medications for anxiety or sleep Certain medications for depression like amitriptyline, fluoxetine, sertraline Certain medications for seizures like phenobarbital, primidone Certain medications for stomach problems General anesthetics like halothane, isoflurane, methoxyflurane, propofol Local anesthetics like lidocaine, pramoxine, tetracaine Medications that relax muscles for surgery Opioid medications for pain Phenothiazines like chlorpromazine, mesoridazine, prochlorperazine, thioridazine This list may not describe all possible interactions. Give your health care provider a list of all the medicines, herbs, non-prescription drugs, or dietary supplements you use. Also tell them if you smoke, drink alcohol, or use illegal drugs. Some items may interact with your medicine. What should I watch for while using this medication? Visit your care team for regular checks on your progress. You may want to keep a record at home of how you feel your condition is responding to treatment. You may want to share this information with your care team at each visit. You should contact your care team if your seizures get worse or if you have any new types of seizures. Do not stop taking this medication or  any of your seizure medications unless instructed by your care team. Stopping your medication suddenly can increase your seizures or their severity. This medication may cause serious skin reactions. They can happen weeks to months after starting  the medication. Contact your care team right away if you notice fevers or flu-like symptoms with a rash. The rash may be red or purple and then turn into blisters or peeling of the skin. Or, you might notice a red rash with swelling of the face, lips or lymph nodes in your neck or under your arms. Wear a medical identification bracelet or chain if you are taking this medication for seizures. Carry a card that lists all your medications. This medication may affect your coordination, reaction time, or judgment. Do not drive or operate machinery until you know how this medication affects you. Sit up or stand slowly to reduce the risk of dizzy or fainting spells. Drinking alcohol with this medication can increase the risk of these side effects. Your mouth may get dry. Chewing sugarless gum or sucking hard candy, and drinking plenty of water may help. Watch for new or worsening thoughts of suicide or depression. This includes sudden changes in mood, behaviors, or thoughts. These changes can happen at any time but are more common in the beginning of treatment or after a change in dose. Call your care team right away if you experience these thoughts or worsening depression. If you become pregnant while using this medication, you may enroll in the [place] Pregnancy Registry by calling [phone]. This registry collects information about the safety of antiepileptic medication use during pregnancy. What side effects may I notice from receiving this medication? Side effects that you should report to your care team as soon as possible: Allergic reactions or angioedema--skin rash, itching, hives, swelling of the face, eyes, lips, tongue, arms, or legs, trouble swallowing or breathing Rash, fever, and swollen lymph nodes Thoughts of suicide or self harm, worsening mood, feelings of depression Trouble breathing Unusual changes in mood or behavior in children after use such as difficulty  concentrating, hostility, or restlessness Side effects that usually do not require medical attention (report to your care team if they continue or are bothersome): Dizziness Drowsiness Nausea Swelling of ankles, feet, or hands Vomiting This list may not describe all possible side effects. Call your doctor for medical advice about side effects. You may report side effects to FDA at 1-800-FDA-1088. Where should I keep my medication? Keep out of reach of children and pets. Store at room temperature between 15 and 30 degrees C (59 and 86 degrees F). Get rid of any unused medication after the expiration date. This medication may cause accidental overdose and death if taken by other adults, children, or pets. To get rid of medications that are no longer needed or have expired: Take the medication to a medication take-back program. Check with your pharmacy or law enforcement to find a location. If you cannot return the medication, check the label or package insert to see if the medication should be thrown out in the garbage or flushed down the toilet. If you are not sure, ask your care team. If it is safe to put it in the trash, empty the medication out of the container. Mix the medication with cat litter, dirt, coffee grounds, or other unwanted substance. Seal the mixture in a bag or container. Put it in the trash. NOTE: This sheet is a summary. It may not cover all possible information. If  you have questions about this medicine, talk to your doctor, pharmacist, or health care provider.  2023 Elsevier/Gold Standard (2020-09-11 00:00:00)

## 2022-04-15 ENCOUNTER — Encounter: Payer: Self-pay | Admitting: Nurse Practitioner

## 2022-04-17 ENCOUNTER — Ambulatory Visit: Payer: Medicare Other | Admitting: Nurse Practitioner

## 2022-04-22 ENCOUNTER — Other Ambulatory Visit: Payer: Self-pay | Admitting: Nurse Practitioner

## 2022-04-22 ENCOUNTER — Other Ambulatory Visit: Payer: Self-pay | Admitting: Sports Medicine

## 2022-04-22 ENCOUNTER — Telehealth: Payer: Self-pay | Admitting: Sports Medicine

## 2022-04-22 DIAGNOSIS — F40240 Claustrophobia: Secondary | ICD-10-CM

## 2022-04-22 DIAGNOSIS — M545 Low back pain, unspecified: Secondary | ICD-10-CM

## 2022-04-22 DIAGNOSIS — R29898 Other symptoms and signs involving the musculoskeletal system: Secondary | ICD-10-CM

## 2022-04-22 DIAGNOSIS — M456 Ankylosing spondylitis lumbar region: Secondary | ICD-10-CM

## 2022-04-22 DIAGNOSIS — G8929 Other chronic pain: Secondary | ICD-10-CM

## 2022-04-22 DIAGNOSIS — E559 Vitamin D deficiency, unspecified: Secondary | ICD-10-CM

## 2022-04-22 NOTE — Telephone Encounter (Signed)
Pt would like to have a third epidural, unsure if the timing is right for this to be ordered.

## 2022-04-22 NOTE — Progress Notes (Unsigned)
Epidural referral placed

## 2022-04-22 NOTE — Telephone Encounter (Signed)
Repeat epidural placed and pt was messaged via [employer]

## 2022-04-23 LAB — FECAL OCCULT BLOOD, GUAIAC: Fecal Occult Blood: NEGATIVE

## 2022-04-30 ENCOUNTER — Encounter: Payer: Self-pay | Admitting: Nurse Practitioner

## 2022-04-30 DIAGNOSIS — G629 Polyneuropathy, unspecified: Secondary | ICD-10-CM

## 2022-04-30 DIAGNOSIS — M5412 Radiculopathy, cervical region: Secondary | ICD-10-CM

## 2022-05-01 ENCOUNTER — Ambulatory Visit
Admission: RE | Admit: 2022-05-01 | Discharge: 2022-05-01 | Disposition: A | Discharge status: Home / Self Care | Payer: Medicare Other | Source: Ambulatory Visit | Attending: Sports Medicine | Admitting: Sports Medicine

## 2022-05-01 DIAGNOSIS — R29898 Other symptoms and signs involving the musculoskeletal system: Secondary | ICD-10-CM

## 2022-05-01 DIAGNOSIS — F40240 Claustrophobia: Secondary | ICD-10-CM

## 2022-05-01 DIAGNOSIS — M47817 Spondylosis without myelopathy or radiculopathy, lumbosacral region: Secondary | ICD-10-CM | POA: Diagnosis not present

## 2022-05-01 DIAGNOSIS — G8929 Other chronic pain: Secondary | ICD-10-CM

## 2022-05-01 DIAGNOSIS — M456 Ankylosing spondylitis lumbar region: Secondary | ICD-10-CM

## 2022-05-01 MED ORDER — IOPAMIDOL (ISOVUE-M 200) INJECTION 41%
1.0000 mL | Freq: Once | INTRAMUSCULAR | Status: AC
  Administered 2022-05-01: 1 mL via EPIDURAL

## 2022-05-01 MED ORDER — METHYLPREDNISOLONE ACETATE 40 MG/ML INJ SUSP (RADIOLOG
80.0000 mg | Freq: Once | INTRAMUSCULAR | Status: AC
  Administered 2022-05-01: 80 mg via EPIDURAL

## 2022-05-01 NOTE — Discharge Instructions (Signed)

## 2022-05-06 ENCOUNTER — Encounter: Payer: Self-pay | Admitting: Nurse Practitioner

## 2022-05-06 ENCOUNTER — Ambulatory Visit (INDEPENDENT_AMBULATORY_CARE_PROVIDER_SITE_OTHER): Payer: Medicare Other | Admitting: Nurse Practitioner

## 2022-05-06 VITALS — BP 128/62 | HR 62 | Temp 97.7°F | Resp 16 | Ht 67.0 in | Wt 182.2 lb

## 2022-05-06 DIAGNOSIS — I10 Essential (primary) hypertension: Secondary | ICD-10-CM | POA: Diagnosis not present

## 2022-05-06 DIAGNOSIS — N400 Enlarged prostate without lower urinary tract symptoms: Secondary | ICD-10-CM

## 2022-05-06 DIAGNOSIS — R972 Elevated prostate specific antigen [PSA]: Secondary | ICD-10-CM

## 2022-05-06 DIAGNOSIS — G40909 Epilepsy, unspecified, not intractable, without status epilepticus: Secondary | ICD-10-CM

## 2022-05-06 DIAGNOSIS — G629 Polyneuropathy, unspecified: Secondary | ICD-10-CM

## 2022-05-06 DIAGNOSIS — D509 Iron deficiency anemia, unspecified: Secondary | ICD-10-CM

## 2022-05-06 DIAGNOSIS — E1169 Type 2 diabetes mellitus with other specified complication: Secondary | ICD-10-CM

## 2022-05-06 MED ORDER — PREGABALIN 50 MG PO CAPS: ORAL_CAPSULE | ORAL | 5 refills | Status: DC

## 2022-05-06 NOTE — Assessment & Plan Note (Signed)
BP at goal with benazepril and amlodipine BP Readings from Last 3 Encounters:  05/06/22 128/62  05/01/22 139/61  04/03/22 116/74    Reviewed CMP results completed by [facility] medical on 04/17/22: creat 1.09, Gfr 44m/min, BUN 17. Normal electrolytes. Maintain med doses

## 2022-05-06 NOTE — Assessment & Plan Note (Signed)
Denies any urinary symptoms Declined to repeat PSA

## 2022-05-06 NOTE — Assessment & Plan Note (Addendum)
Repeat hgbA1c completed by [facility] medical on 04/17/22: 7.9% [facility] use [facility] Normal urine microalbumin LDL not at goal: followed by lipid clinic, unable to tolerate Statin, current use of repatha Neuropathy present Normal urine microalbumin: microalbumin/creatinine ratio at 17.3 Advised to schedule annual eye exam

## 2022-05-06 NOTE — Progress Notes (Signed)
Established Patient Visit  Patient: Andrew Jones   DOB: [birth year]   82 y.o. Male  MRN: [id number] Visit Date: 05/06/2022  Subjective:    Chief Complaint  Patient presents with   Office visit    Neuropathy- Numbness and tingling lower and upper extremities-   Denies Tdap/td    HPI Seizure disorder No seizure since 2011 Stopped dilantin 83yr ago under the guidance of Dr. SLeonie Man   Hypertension BP at goal with benazepril and amlodipine BP Readings from Last 3 Encounters:  05/06/22 128/62  05/01/22 139/61  04/03/22 116/74    [facility]: creat 1.09, Gfr 718mmin, BUN 17. Normal electrolytes. Maintain med doses  DM (diabetes mellitus) ([place]asc[place]peat hgbA1c [facility]: 7.9% Current use of metformin and [facility]empic [facility] Normal urine microalbumin LDL not at goal: followed by lipid clinic, unable to tolerate Statin, current use of repatha Neuropathy present Normal urine microalbumin: microalbumin/creatinine ratio at 17.3 Advised to schedule annual eye exam  Neuropathy Symptoms improved with gabapentin 10049mn Am and 200m38m PM, but developed worsening muscle weakness and fatigue. Numbness and tingling got worse again when gabapentin dose was decreased to 100mg82mPM. B12 585, folate >20, vit. D 65, mag 1.9. We discussed switch from gabapentin to lyrica and possible side effects. We agreed to switch medication. We will titrate dose to 50mg 9m F/up in 40month44monthdeficiency anemia [facility]: RBC 4.49, Hgb/Hct 11.6/35.5, MCV 79.1, MCH 25.8, ferritin 7.2, Iron 37, TIBC 7.8%, Transferrin 338, RDW 15.3, platelet 269. He denies any hematuria, or melena or hematochezia or nausea or ABD pain. He reports [facility]. Report requested. He now take ferrous sulfate 325mg 3x58mk. Reports constipation with iron supplement. Advised to  start colace 100mg dai23madequate oral hydration and high fiber diet.  Benign prostatic hyperplasia Denies any urinary symptoms Declined to repeat PSA  Reviewed medical, surgical, and social history today  Medications: Outpatient Medications Prior to Visit  Medication Sig   Calcium-Cholecalciferol (QC CALCIUM 500MG-D3) 500-5 MG-MCG TABS TAKE 1 TABLET BY MOUTH TWICE A DAY WITH FOOD. GET REST OF CALCIUM FROM FOOD - DAIRY PRODUCTS ETC. TOTAL  1200MG/DAY WITH FOOD. GET REST OF CALCIUM FROM FOOD - DAIRY PRODUCTS ETC. TOTAL   1200MG/DAY   diclofenac Sodium (VOLTAREN) 1 % GEL APPLY 2 GRAMS TO AFFECTED AREA TWICE A DAY AS NEEDED FOR PAINFUL SMALL JOINTS (DO NOT TAKE DICLOFENAC TABLETS WHILE USING THIS GEL)   ferrous sulfate 325 (65 FE) MG EC tablet TAKE ONE TABLET BY MOUTH MONDAY, WEDNESDAY AND FRIDAY   Semaglutide,0.25 or 0.5MG/DOS, 2 MG/3ML SOPN INJECT 0.25MG OF 0.5MG/0.375ML SUBCUTANEOUSLY ONCE WEEKLY FOR 4 WEEKS, THEN INJECT 0.5MG OF 0.5MG/0.375ML ONCE WEEKLY FOR DIABETES   Alirocumab 75 MG/ML SOAJ Inject 75 mg into the skin every 14 (fourteen) days.   amLODipine (NORVASC) 10 MG tablet Take 1 tablet (10 mg total) by mouth daily.   aspirin EC 81 MG tablet Take 1 tablet (81 mg total) by mouth daily. Swallow whole.   benazepril (LOTENSIN) 10 MG tablet Take 10 mg by mouth daily.   Cholecalciferol (VITAMIN D3) 125 MCG (5000 UT) CAPS Take 1 capsule (5,000 Units total) by mouth daily.   empagliflozin (JARDIANCE) 25 MG TABS tablet Take 1 tablet (25 mg total) by mouth daily.  etanercept (ENBREL) 50 MG/ML injection Inject 50 mg into the skin once a week.   fish oil-omega-3 fatty acids 1000 MG capsule Take 2 g by mouth daily.   LORazepam (ATIVAN) 0.5 MG tablet 1-2 tabs 30 - 60 min prior to MRI. Do not drive with this medicine.   Magnesium Oxide 420 MG TABS Take 1 tablet (420 mg total) by mouth daily after breakfast.   metFORMIN (GLUCOPHAGE) 1000 MG tablet Take 1,000 mg by mouth 2 (two) times daily with a  meal.   metoprolol tartrate (LOPRESSOR) 25 MG tablet Take 25 mg by mouth 2 (two) times daily.   Multiple Vitamin (MULTIVITAMIN) tablet Take 1 tablet by mouth daily.   omeprazole (PRILOSEC) 20 MG capsule Take 20 mg by mouth daily.   RESTASIS 0.05 % ophthalmic emulsion Place 1 drop into both eyes 2 (two) times daily.   [DISCONTINUED] gabapentin (NEURONTIN) 100 MG capsule Take 1 capsule (100 mg total) by mouth at bedtime.   No facility-administered medications prior to visit.   Reviewed past medical and social history.   ROS per HPI above      Objective:  BP 128/62 (BP Location: Left Arm, Patient Position: Sitting, Cuff Size: Normal)   Pulse 62   Temp 97.7 F (36.5 C) (Temporal)   Resp 16   Ht 5' 7"$  (1.702 m)   Wt 182 lb 3.2 oz (82.6 kg)   SpO2 98%   BMI 28.54 kg/m      Physical Exam Vitals reviewed.  Cardiovascular:     Rate and Rhythm: Normal rate and regular rhythm.     Pulses: Normal pulses.     Heart sounds: Normal heart sounds.  Pulmonary:     Effort: Pulmonary effort is normal.     Breath sounds: Normal breath sounds.  Musculoskeletal:     Right lower leg: No edema.     Left lower leg: No edema.  Skin:    General: Skin is warm and dry.  Neurological:     Mental Status: He is alert and oriented to person, place, and time.  Psychiatric:        Mood and Affect: Mood normal.        Behavior: Behavior normal.        Thought Content: Thought content normal.     No results found for any visits on 05/06/22.    Assessment & Plan:    Problem List Items Addressed This Visit       Cardiovascular and Mediastinum   Hypertension - Primary    BP at goal with benazepril and amlodipine BP Readings from Last 3 Encounters:  05/06/22 128/62  05/01/22 139/61  04/03/22 116/74    Reviewed CMP results completed by [facility] medical on 04/17/22: creat 1.09, Gfr 42m/min, BUN 17. Normal electrolytes. Maintain med doses        Endocrine   DM (diabetes mellitus) ([place]     [facility] 7.9% Current use of metformin and ozempic [facility] Normal urine microalbumin LDL not at goal: followed by lipid clinic, unable to tolerate Statin, current use of repatha Neuropathy present Normal urine microalbumin: microalbumin/creatinine ratio at 17.3 Advised to schedule annual eye exam      Relevant Medications   Semaglutide,0.25 or 0.5MG/DOS, 2 MG/3ML SOPN     Nervous and Auditory   Neuropathy    Symptoms improved with gabapentin 1049min Am and 20029mn PM, but developed worsening muscle weakness and fatigue. Numbness and tingling  got worse again when gabapentin dose was decreased to 137m in PM. B12 585, folate >20, vit. D 65, mag 1.9. We discussed switch from gabapentin to lyrica and possible side effects. We agreed to switch medication. We will titrate dose to 559mBID. F/up in 54m554month   Relevant Medications   pregabalin (LYRICA) 50 MG capsule   Seizure disorder (HCCArdoch  No seizure since 2011 Stopped dilantin 15yr315yro under the guidance of Dr. SethLeonie Man    Relevant Medications   pregabalin (LYRICA) 50 MG capsule     Genitourinary   Benign prostatic hyperplasia    Denies any urinary symptoms Declined to repeat PSA        Other   Iron deficiency anemia    [facility]: RBC 4.49, Hgb/Hct 11.6/35.5, MCV 79.1, MCH 25.8, ferritin 7.2, Iron 37, TIBC 7.8%, Transferrin 338, RDW 15.3, platelet 269. He denies any hematuria, or melena or hematochezia or nausea or ABD pain. He reports [facility]B [facility]. Report requested. He now take ferrous sulfate 325mg18mweek. Reports constipation with iron supplement. Advised to start colace 100mg 28my, adequate oral hydration and high fiber diet.      Relevant Medications   ferrous sulfate 325 (65 FE) MG EC tablet   Other Visit Diagnoses     PSA elevation          Return in about 4 weeks (around 06/03/2022) for  neuropathy.     CharloWilfred Lacy

## 2022-05-06 NOTE — Patient Instructions (Addendum)
Start colace 178m daily for constipation. Stop gabapentin Start lyrica as prescribed  Pregabalin Capsules What is this medication? PREGABALIN (pre GAB a lin) treats nerve pain. It may also be used to prevent and control seizures in people with epilepsy. It works by calming overactive nerves in your body. This medicine may be used for other purposes; ask your health care provider or pharmacist if you have questions. COMMON BRAND NAME(S): Lyrica What should I tell my care team before I take this medication? They need to know if you have any of these conditions: Heart failure Kidney disease Lung disease Substance use disorder Suicidal thoughts, plans or attempt by you or a family member An unusual or allergic reaction to pregabalin, other medications, foods, dyes, or preservatives Pregnant or trying to get pregnant Breast-feeding How should I use this medication? Take this medication by mouth with water. Take it as directed on the prescription label at the same time every day. You can take it with or without food. If it upsets your stomach, take it with food. Keep taking it unless your care team tells you to stop. A special MedGuide will be given to you by the pharmacist with each prescription and refill. Be sure to read this information carefully each time. Talk to your care team about the use of this medication in children. While it may be prescribed for children as young as 1 month for selected conditions, precautions do apply. Overdosage: If you think you have taken too much of this medicine contact a poison control center or emergency room at once. NOTE: This medicine is only for you. Do not share this medicine with others. What if I miss a dose? If you miss a dose, take it as soon as you can. If it is almost time for your next dose, take only that dose. Do not take double or extra doses. What may interact with this medication? This medication may interact with the  following: Alcohol Antihistamines for allergy, cough, and cold Certain medications for anxiety or sleep Certain medications for blood pressure, heart disease Certain medications for depression like amitriptyline, fluoxetine, sertraline Certain medications for diabetes, like pioglitazone, rosiglitazone Certain medications for seizures like phenobarbital, primidone General anesthetics like halothane, isoflurane, methoxyflurane, propofol Medications that relax muscles for surgery Opioid medications for pain Phenothiazines like chlorpromazine, mesoridazine, prochlorperazine, thioridazine This list may not describe all possible interactions. Give your health care provider a list of all the medicines, herbs, non-prescription drugs, or dietary supplements you use. Also tell them if you smoke, drink alcohol, or use illegal drugs. Some items may interact with your medicine. What should I watch for while using this medication? Visit your care team for regular checks on your progress. Tell your care team if your symptoms do not start to get better or if they get worse. Do not suddenly stop taking this medication. You may develop a severe reaction. Your care team will tell you how much medication to take. If your care team wants you to stop the medication, the dose may be slowly lowered over time to avoid any side effects. This medication may affect your coordination, reaction time, or judgment. Do not drive or operate machinery until you know how this medication affects you. Sit up or stand slowly to reduce the risk of dizzy or fainting spells. Drinking alcohol with this medication can increase the risk of these side effects. If you or your family notice any changes in your behavior, such as new or worsening depression, thoughts of  harming yourself, anxiety, other unusual or disturbing thoughts, or memory loss, call your care team right away. Wear a medical ID bracelet or chain if you are taking this  medication for seizures. Carry a card that describes your condition. List the medications and doses you take on the card. This medication may make it more difficult to father a child. Talk to your care team if you are concerned about your fertility. What side effects may I notice from receiving this medication? Side effects that you should report to your care team as soon as possible: Allergic reactions or angioedema--skin rash, itching, hives, swelling of the face, eyes, lips, tongue, arms, or legs, trouble swallowing or breathing Blurry vision Thoughts of suicide or self-harm, worsening mood, feelings of depression Trouble breathing Side effects that usually do not require medical attention (report to your care team if they continue or are bothersome): Dizziness Drowsiness Dry mouth Nausea Swelling of the ankles, feet, hands Vomiting Weight gain This list may not describe all possible side effects. Call your doctor for medical advice about side effects. You may report side effects to FDA at 1-800-FDA-1088. Where should I keep my medication? Keep out of the reach of children and pets. This medication can be abused. Keep it in a safe place to protect it from theft. Do not share it with anyone. It is only for you. Selling or giving away this medication is dangerous and against the law. Store at [employer] C (77 degrees F). Get rid of any unused medication after the expiration date. This medication may cause harm and death if it is taken by other adults, children, or pets. It is important to get rid of the medication as soon as you no longer need it, or it is expired. You can do this in two ways: Take the medication to a medication take-back program. Check with your pharmacy or law enforcement to find a location. If you cannot return the medication, check the label or package insert to see if the medication should be thrown out in the garbage or flushed down the toilet. If you are not sure, ask  your care team. If it is safe to put it in the trash, take the medication out of the container. Mix the medication with cat litter, dirt, coffee grounds, or other unwanted substance. Seal the mixture in a bag or container. Put it in the trash. NOTE: This sheet is a summary. It may not cover all possible information. If you have questions about this medicine, talk to your doctor, pharmacist, or health care provider.  2023 Elsevier/Gold Standard (2020-03-07 00:00:00)

## 2022-05-06 NOTE — Assessment & Plan Note (Addendum)
Labs completed by [facility] medical on 04/17/22: RBC 4.49, Hgb/Hct 11.6/35.5, MCV 79.1, MCH 25.8, ferritin 7.2, Iron 37, TIBC 7.8%, Transferrin 338, RDW 15.3, platelet 269. He denies any hematuria, or melena or hematochezia or nausea or ABD pain. He reports iFOB was completed by [facility] provider: negative. Report requested. He now take ferrous sulfate 369m 3x/week. Reports constipation with iron supplement. Advised to start colace 1029mdaily, adequate oral hydration and high fiber diet.

## 2022-05-06 NOTE — Assessment & Plan Note (Signed)
>>  ASSESSMENT AND PLAN FOR NEUROPATHY WRITTEN ON 05/06/2022 12:13 PM BY Kerston Landeck LUM, NP  Symptoms improved with gabapentin 100mg  in Am and 200mg  in PM, but developed worsening muscle weakness and fatigue. Numbness and tingling got worse again when gabapentin dose was decreased to 100mg  in PM. B12 585, folate >20, vit. D 65, mag 1.9. We discussed switch from gabapentin to lyrica and possible side effects. We agreed to switch medication. We will titrate dose to 50mg  BID. F/up in 22month

## 2022-05-06 NOTE — Assessment & Plan Note (Signed)
Symptoms improved with gabapentin 135m in Am and 2025min PM, but developed worsening muscle weakness and fatigue. Numbness and tingling got worse again when gabapentin dose was decreased to 10039mn PM. B12 585, folate >20, vit. D 65, mag 1.9. We discussed switch from gabapentin to lyrica and possible side effects. We agreed to switch medication. We will titrate dose to 45m57mD. F/up in 1mon69month

## 2022-05-06 NOTE — Assessment & Plan Note (Signed)
No seizure since 2011 Stopped dilantin 76yr ago under the guidance of Dr. SLeonie Man

## 2022-05-08 ENCOUNTER — Encounter: Payer: Self-pay | Admitting: Nurse Practitioner

## 2022-05-08 LAB — FECAL OCCULT BLOOD, GUAIAC: Fecal Occult Blood: NEGATIVE

## 2022-05-10 ENCOUNTER — Telehealth: Payer: Self-pay | Admitting: Oncology

## 2022-05-10 NOTE — Telephone Encounter (Signed)
Scheduled appt per 2/16 referral. Pt is aware of appt date and time. Pt is aware to arrive 15 mins prior to appt time and to bring and updated insurance card. Pt is aware of appt location.   °

## 2022-05-14 ENCOUNTER — Other Ambulatory Visit: Payer: Self-pay | Admitting: Sports Medicine

## 2022-05-14 DIAGNOSIS — M48061 Spinal stenosis, lumbar region without neurogenic claudication: Secondary | ICD-10-CM

## 2022-05-14 DIAGNOSIS — R29898 Other symptoms and signs involving the musculoskeletal system: Secondary | ICD-10-CM

## 2022-05-14 NOTE — Telephone Encounter (Signed)
Called and notified patient of referral to neurosurgery and he vocalized understanding and was appreciative

## 2022-05-14 NOTE — Progress Notes (Unsigned)
Referral placed   neurosurgery as soon as possible.  Can we please place a neurosurgery referral for lumbar stenosis and lower extremity weakness

## 2022-05-20 DIAGNOSIS — R292 Abnormal reflex: Secondary | ICD-10-CM | POA: Diagnosis not present

## 2022-05-20 DIAGNOSIS — Z6827 Body mass index (BMI) 27.0-27.9, adult: Secondary | ICD-10-CM | POA: Diagnosis not present

## 2022-05-20 DIAGNOSIS — R2681 Unsteadiness on feet: Secondary | ICD-10-CM | POA: Diagnosis not present

## 2022-05-20 DIAGNOSIS — M47816 Spondylosis without myelopathy or radiculopathy, lumbar region: Secondary | ICD-10-CM | POA: Diagnosis not present

## 2022-05-30 ENCOUNTER — Other Ambulatory Visit: Payer: Self-pay | Admitting: Neurosurgery

## 2022-05-30 DIAGNOSIS — R2681 Unsteadiness on feet: Secondary | ICD-10-CM

## 2022-05-31 ENCOUNTER — Inpatient Hospital Stay: Payer: Medicare Other | Attending: Oncology | Admitting: Oncology

## 2022-05-31 ENCOUNTER — Inpatient Hospital Stay: Payer: Medicare Other

## 2022-05-31 VITALS — BP 133/55 | HR 78 | Temp 97.5°F | Resp 14 | Ht 68.0 in | Wt 181.8 lb

## 2022-05-31 DIAGNOSIS — I10 Essential (primary) hypertension: Secondary | ICD-10-CM | POA: Diagnosis not present

## 2022-05-31 DIAGNOSIS — Z79899 Other long term (current) drug therapy: Secondary | ICD-10-CM | POA: Diagnosis not present

## 2022-05-31 DIAGNOSIS — E1159 Type 2 diabetes mellitus with other circulatory complications: Secondary | ICD-10-CM | POA: Diagnosis not present

## 2022-05-31 DIAGNOSIS — Z7982 Long term (current) use of aspirin: Secondary | ICD-10-CM | POA: Diagnosis not present

## 2022-05-31 DIAGNOSIS — Z8673 Personal history of transient ischemic attack (TIA), and cerebral infarction without residual deficits: Secondary | ICD-10-CM | POA: Diagnosis not present

## 2022-05-31 DIAGNOSIS — D500 Iron deficiency anemia secondary to blood loss (chronic): Secondary | ICD-10-CM | POA: Diagnosis not present

## 2022-05-31 LAB — CBC WITH DIFFERENTIAL (CANCER CENTER ONLY)
Abs Immature Granulocytes: 0.03 10*3/uL (ref 0.00–0.07)
Basophils Absolute: 0.1 10*3/uL (ref 0.0–0.1)
Basophils Relative: 1 %
Eosinophils Absolute: 0.3 10*3/uL (ref 0.0–0.5)
Eosinophils Relative: 3 %
HCT: 36.5 % — ABNORMAL LOW (ref 39.0–52.0)
Hemoglobin: 12 g/dL — ABNORMAL LOW (ref 13.0–17.0)
Immature Granulocytes: 0 %
Lymphocytes Relative: 39 %
Lymphs Abs: 4.3 10*3/uL — ABNORMAL HIGH (ref 0.7–4.0)
MCH: 27 pg (ref 26.0–34.0)
MCHC: 32.9 g/dL (ref 30.0–36.0)
MCV: 82 fL (ref 80.0–100.0)
Monocytes Absolute: 1.1 10*3/uL — ABNORMAL HIGH (ref 0.1–1.0)
Monocytes Relative: 10 %
Neutro Abs: 5.2 10*3/uL (ref 1.7–7.7)
Neutrophils Relative %: 47 %
Platelet Count: 335 10*3/uL (ref 150–400)
RBC: 4.45 MIL/uL (ref 4.22–5.81)
RDW: 16.7 % — ABNORMAL HIGH (ref 11.5–15.5)
WBC Count: 11.1 10*3/uL — ABNORMAL HIGH (ref 4.0–10.5)
nRBC: 0 % (ref 0.0–0.2)

## 2022-05-31 LAB — CMP (CANCER CENTER ONLY)
ALT: 22 U/L (ref 0–44)
AST: 18 U/L (ref 15–41)
Albumin: 4.1 g/dL (ref 3.5–5.0)
Alkaline Phosphatase: 57 U/L (ref 38–126)
Anion gap: 8 (ref 5–15)
BUN: 14 mg/dL (ref 8–23)
CO2: 27 mmol/L (ref 22–32)
Calcium: 9.1 mg/dL (ref 8.9–10.3)
Chloride: 100 mmol/L (ref 98–111)
Creatinine: 0.83 mg/dL (ref 0.61–1.24)
GFR, Estimated: >60 mL/min (ref >60)
Glucose, Bld: 116 mg/dL — ABNORMAL HIGH (ref 70–99)
Potassium: 4.5 mmol/L (ref 3.5–5.1)
Sodium: 135 mmol/L (ref 135–145)
Total Bilirubin: 0.3 mg/dL (ref 0.3–1.2)
Total Protein: 6.8 g/dL (ref 6.5–8.1)

## 2022-05-31 LAB — DIRECT ANTIGLOBULIN TEST (NOT AT ARMC)
DAT, IgG: NEGATIVE
DAT, complement: NEGATIVE

## 2022-05-31 LAB — FERRITIN: Ferritin: 10 ng/mL — ABNORMAL LOW (ref 24–336)

## 2022-05-31 LAB — RETICULOCYTES
Immature Retic Fract: 13.2 % (ref 2.3–15.9)
RBC.: 4.46 MIL/uL (ref 4.22–5.81)
Retic Count, Absolute: 91 10*3/uL (ref 19.0–186.0)
Retic Ct Pct: 2 % (ref 0.4–3.1)

## 2022-05-31 LAB — FOLATE: Folate: 31.1 ng/mL (ref >5.9)

## 2022-05-31 LAB — VITAMIN B12: Vitamin B-12: 443 pg/mL (ref 180–914)

## 2022-05-31 NOTE — Progress Notes (Signed)
[facility] Initial Visit:  Patient Care Team: Nche, Charlene Brooke, NP as PCP - General (Internal Medicine) Garvin Fila, MD (Neurology) [place], Heather, [place] (Optometry) Irene Shipper, MD (Gastroenterology)  CHIEF COMPLAINTS/PURPOSE OF CONSULTATION:  HISTORY OF PRESENTING ILLNESS: Andrew Jones 82 y.o. male is here because of anemia Medical history notable for ankylosing spondylitis, CVA, diabetes mellitus type 2, hyperlipidemia, hypertension, seizure disorder, sleep apnea on CPAP  January 10, 2022: WBC 10.4 hemoglobin 12.3 platelet count 280 CMP notable for sodium 136 potassium 5.6 ALT 48  April 23, 2022 and May 08, 2022 fecal occult blood testing negative  May 31 2022:  [facility] Consult  Patient states that he has been told that he is intermittently anemic and was recently placed on oral iron which he doesn't tolerate well due to constipation.  Has never received IV iron nor required PRBC's in the past. Has a normal diet.  No surgical history.  No hematochezia, melena, hemoptysis, hematuria.  No  history of intra-articular or soft tissue bleeding.  Takes Tylenol for pain.  Does not use NSAIDS for pain Is not taking oral anticoagulants  Takes ASA 81 mg at night.  No history of abnormal bleeding in family members.  Patient has symptoms of fatigue, pallor,  DOE, decreased performance status.  No pica to ice/starch/dirt. Underwent a colonoscopy about 8 yrs ago which was negative per patient.  No history of EGD.    Social:  Married.  Owns a used car lot.  Tobacco none.  EtOH used to drink one a day but quit.  No Agent Orange Exposure to his knowledge.   Review of Systems - Oncology  MEDICAL HISTORY: Past Medical History:  Diagnosis Date   Ankylosing spondylitis ([place])    back, hips - on embrel 2000-2008, resumed 12/2012   CVA (cerebral vascular accident) ([place]) 1999, 2002   no residual problems   Diabetes mellitus, type 2 (HCC)    Dyslipidemia     Hypertension    Osteoarthritis    Seizure disorder ([place]) 05/2009   Sleep apnea    uses c-pap    SURGICAL HISTORY: Past Surgical History:  Procedure Laterality Date   NO PAST SURGERIES      SOCIAL HISTORY: Social History   Socioeconomic History   Marital status: Married    Spouse name: Not on file   Number of children: 1   Years of education: 13   Highest education level: Not on file  Occupational History   Occupation: Retired  Tobacco Use   Smoking status: Former   Smokeless tobacco: Never   Tobacco comments:    30 yrs ago  Scientific laboratory technician Use: Never used  Substance and Sexual Activity   Alcohol use: Not Currently   Drug use: No   Sexual activity: Yes  Other Topics Concern   Not on file  Social History Narrative   Lives with wife   Right Handed   Drinks 2-3 cups caffeine daily   Social Determinants of Health   Financial Resource Strain: Low Risk  (03/29/2022)   Overall Financial Resource Strain (CARDIA)    Difficulty of Paying Living Expenses: Not hard at all  Food Insecurity: No Food Insecurity (03/29/2022)   Hunger Vital Sign    Worried About Running Out of Food in the Last Year: Never true    [place] in the Last Year: Never true  Transportation Needs: No Transportation Needs (03/29/2022)   [place] -  Hydrologist (Medical): No    Lack of Transportation (Non-Medical): No  Physical Activity: Inactive (03/29/2022)   Exercise Vital Sign    Days of Exercise per Week: 0 days    Minutes of Exercise per Session: 0 min  Stress: No Stress Concern Present (03/29/2022)   [place]    Feeling of Stress : Not at all  Social Connections: Moderately Isolated (03/28/2021)   Social Connection and Isolation Panel [NHANES]    Frequency of Communication with Friends and Family: Twice a week    Frequency of Social Gatherings with Friends and Family: Twice a week    Attends  Religious Services: Never    Marine scientist or Organizations: No    Attends Archivist Meetings: Never    Marital Status: Married  Human resources officer Violence: Not At Risk (03/28/2021)   Humiliation, Afraid, Rape, and Kick questionnaire    Fear of Current or Ex-Partner: No    Emotionally Abused: No    Physically Abused: No    Sexually Abused: No    FAMILY HISTORY Family History  Problem Relation Age of Onset   Heart failure Mother    Heart disease Mother    COPD Father    Prostate cancer Brother     ALLERGIES:  is allergic to simvastatin.  MEDICATIONS:  Current Outpatient Medications  Medication Sig Dispense Refill   Alirocumab 75 MG/ML SOAJ Inject 75 mg into the skin every 14 (fourteen) days. 2 mL    amLODipine (NORVASC) 10 MG tablet Take 1 tablet (10 mg total) by mouth daily.     aspirin EC 81 MG tablet Take 1 tablet (81 mg total) by mouth daily. Swallow whole. 30 tablet 11   benazepril (LOTENSIN) 10 MG tablet Take 10 mg by mouth daily.     Calcium-Cholecalciferol (QC CALCIUM 500MG -D3) 500-5 MG-MCG TABS TAKE 1 TABLET BY MOUTH TWICE A DAY WITH FOOD. GET REST OF CALCIUM FROM FOOD - DAIRY PRODUCTS ETC. TOTAL  1200MG /DAY WITH FOOD. GET REST OF CALCIUM FROM FOOD - DAIRY PRODUCTS ETC. TOTAL   1200MG /DAY     Cholecalciferol (VITAMIN D3) 125 MCG (5000 UT) CAPS Take 1 capsule (5,000 Units total) by mouth daily. 90 capsule 1   diclofenac Sodium (VOLTAREN) 1 % GEL APPLY 2 GRAMS TO AFFECTED AREA TWICE A DAY AS NEEDED FOR PAINFUL SMALL JOINTS (DO NOT TAKE DICLOFENAC TABLETS WHILE USING THIS GEL)     empagliflozin (JARDIANCE) 25 MG TABS tablet Take 1 tablet (25 mg total) by mouth daily. 30 tablet    etanercept (ENBREL) 50 MG/ML injection Inject 50 mg into the skin once a week.     ferrous sulfate 325 (65 FE) MG EC tablet TAKE ONE TABLET BY MOUTH MONDAY, WEDNESDAY AND FRIDAY     fish oil-omega-3 fatty acids 1000 MG capsule Take 2 g by mouth daily.     Magnesium Oxide 420 MG TABS  Take 1 tablet (420 mg total) by mouth daily after breakfast.  0   metFORMIN (GLUCOPHAGE) 1000 MG tablet Take 1,000 mg by mouth 2 (two) times daily with a meal.     metoprolol tartrate (LOPRESSOR) 25 MG tablet Take 25 mg by mouth 2 (two) times daily.     Multiple Vitamin (MULTIVITAMIN) tablet Take 1 tablet by mouth daily.     omeprazole (PRILOSEC) 20 MG capsule Take 20 mg by mouth daily.     RESTASIS 0.05 % ophthalmic emulsion  Place 1 drop into both eyes 2 (two) times daily.     Semaglutide,0.25 or 0.5MG /DOS, 2 MG/3ML SOPN INJECT 0.25MG  OF 0.5MG /0.375ML SUBCUTANEOUSLY ONCE WEEKLY FOR 4 WEEKS, THEN INJECT 0.5MG  OF 0.5MG /0.375ML ONCE WEEKLY FOR DIABETES     pregabalin (LYRICA) 75 MG capsule Take 1 capsule (75 mg total) by mouth 2 (two) times daily. 60 capsule 5   No current facility-administered medications for this visit.    PHYSICAL EXAMINATION:  ECOG PERFORMANCE STATUS: 1 - Symptomatic but completely ambulatory   Vitals:   05/31/22 1248  BP: (!) 133/55  Pulse: 78  Resp: 14  Temp: (!) 97.5 F (36.4 C)  SpO2: 99%    Filed Weights   05/31/22 1248  Weight: 181 lb 12.8 oz (82.5 kg)     Physical Exam Vitals and nursing note reviewed.  Constitutional:      General: He is not in acute distress.    Appearance: Normal appearance. He is not ill-appearing, toxic-appearing or diaphoretic.     Comments: Here alone.  Has walker  HENT:     Head: Normocephalic and atraumatic.     Right Ear: External ear normal.     Left Ear: External ear normal.     Nose: Nose normal.  Eyes:     General: No scleral icterus.    Conjunctiva/sclera: Conjunctivae normal.     Pupils: Pupils are equal, round, and reactive to light.  Cardiovascular:     Rate and Rhythm: Normal rate and regular rhythm.     Heart sounds:     No friction rub. No gallop.  Pulmonary:     Effort: Pulmonary effort is normal. No respiratory distress.     Breath sounds: Normal breath sounds. No stridor. No wheezing or rhonchi.   Abdominal:     Palpations: Abdomen is soft. There is no mass.     Tenderness: There is no abdominal tenderness. There is no guarding.     Hernia: No hernia is present.  Musculoskeletal:        General: No deformity.     Cervical back: Normal range of motion and neck supple. No rigidity or tenderness.     Right lower leg: No edema.     Left lower leg: No edema.  Lymphadenopathy:     Head:     Right side of head: No submental, submandibular, tonsillar, preauricular, posterior auricular or occipital adenopathy.     Left side of head: No submental, submandibular, tonsillar, preauricular, posterior auricular or occipital adenopathy.     Cervical: No cervical adenopathy.     Right cervical: No superficial, deep or posterior cervical adenopathy.    Left cervical: No superficial, deep or posterior cervical adenopathy.     Upper Body:     Right upper body: No supraclavicular or axillary adenopathy.     Left upper body: No supraclavicular or axillary adenopathy.     Lower Body: No right inguinal adenopathy. No left inguinal adenopathy.  Skin:    Coloration: Skin is not jaundiced.     Findings: No bruising.  Neurological:     General: No focal deficit present.     Mental Status: He is alert and oriented to person, place, and time.     Motor: Weakness present.     Gait: Gait abnormal.     Comments: Has rollator  Psychiatric:        Mood and Affect: Mood normal.        Behavior: Behavior normal.  Thought Content: Thought content normal.        Judgment: Judgment normal.     LABORATORY DATA: I have personally reviewed the data as listed:  Appointment on 05/31/2022  Component Date Value Ref Range Status   Hgb F 05/31/2022 0.0  0.0 - 2.0 % Final   Hgb A 05/31/2022 98.1  96.4 - 98.8 % Final   Hgb A2 05/31/2022 1.9  1.8 - 3.2 % Final   Hgb S 05/31/2022 0.0  0.0 % Final   Interpretation, Hgb Fract 05/31/2022 Comment   Final   Comment: (NOTE) Normal hemoglobin present; no  hemoglobin variant or beta thalassemia identified. Note: Alpha thalassemia may not be detected by the Hgb Fractionation Cascade panel. If alpha thalassemia is suspected, [facility] offers Alpha-Thalassemia DNA Analysis [phone]). Performed At: [facility] [place], [place] [id number] Rush Farmer MD [id number]    Kappa free light chain 05/31/2022 30.5 (H)  3.3 - 19.4 mg/L Final   Lambda free light chains 05/31/2022 22.2  5.7 - 26.3 mg/L Final   Kappa, lambda light chain ratio 05/31/2022 1.37  0.26 - 1.65 Final   Comment: (NOTE) Performed At: [facility] [place], [place] [id number] Rush Farmer MD [id number]    IgG (Immunoglobin G), Serum 05/31/2022 782  603 - 1,613 mg/dL Final   IgA 05/31/2022 97  61 - 437 mg/dL Final   IgM (Immunoglobulin M), Srm 05/31/2022 51  15 - 143 mg/dL Final   Total Protein ELP 05/31/2022 6.3  6.0 - 8.5 g/dL Corrected   Albumin SerPl Elph-Mcnc 05/31/2022 3.6  2.9 - 4.4 g/dL Corrected   Alpha 1 05/31/2022 0.3  0.0 - 0.4 g/dL Corrected   Alpha2 Glob SerPl Elph-Mcnc 05/31/2022 0.7  0.4 - 1.0 g/dL Corrected   B-Globulin SerPl Elph-Mcnc 05/31/2022 0.9  0.7 - 1.3 g/dL Corrected   Gamma Glob SerPl Elph-Mcnc 05/31/2022 0.7  0.4 - 1.8 g/dL Corrected   M Protein SerPl Elph-Mcnc 05/31/2022 Not Observed  Not Observed g/dL Corrected   Globulin, Total 05/31/2022 2.7  2.2 - 3.9 g/dL Corrected   Albumin/Glob SerPl 05/31/2022 1.4  0.7 - 1.7 Corrected   IFE 1 05/31/2022 Comment   Corrected   Comment: (NOTE) The immunofixation pattern appears unremarkable. Evidence of monoclonal protein is not apparent.    Please Note 05/31/2022 Comment   Corrected   Comment: (NOTE) Protein electrophoresis scan will follow via computer, mail, or courier delivery. Performed At: [facility] [place], [place] [id number] Rush Farmer MD [id number]    Retic Ct Pct 05/31/2022 2.0  0.4 - 3.1 % Final   RBC.  05/31/2022 4.46  4.22 - 5.81 MIL/uL Final   Retic Count, Absolute 05/31/2022 91.0  19.0 - 186.0 K/uL Final   Immature Retic Fract 05/31/2022 13.2  2.3 - 15.9 % Final   Performed at [facility], [place] [address], [place], [place] [address]   Vitamin B-12 05/31/2022 443  180 - 914 pg/mL Final   Comment: (NOTE) This assay is not validated for testing neonatal or myeloproliferative syndrome specimens for Vitamin B12 levels. Performed at [facility], [place] [address], [place], [place] [address]    Zinc 05/31/2022 63  44 - 115 ug/dL Final   Comment: (NOTE) This test was developed and its performance characteristics determined by [facility]. It has not been cleared or approved by the Food and Drug Administration.  Detection Limit = 5 Performed At: [facility] [address] [place], [place] [id number] Rush Farmer MD [id number]    Haptoglobin 05/31/2022 123  34 - 355 mg/dL Final   Comment: (NOTE) Performed At: [facility] [place], [place] [id number] Rush Farmer MD [id number]    Folate 05/31/2022 31.1  >5.9 ng/mL Final   Comment: RESULT CONFIRMED BY MANUAL DILUTION Performed at [facility], [place] [address], [place], [place] [address]    Ferritin 05/31/2022 10 (L)  24 - 336 ng/mL Final   Performed at [facility], [address] [place], [place] [address]   DAT, complement 05/31/2022 NEG   Final   DAT, IgG 05/31/2022    Final                   Value:NEG Performed at [facility], [place] [address], [place], [place] [address]    Copper 05/31/2022 62 (L)  69 - 132 ug/dL Final   Comment: (NOTE) This test was developed and its performance characteristics determined by [facility]. It has not been cleared or approved by the Food and Drug Administration.                                Detection Limit = 5 Performed At: [facility] [place], [place] [id number] Rush Farmer MD [id number]    Sodium 05/31/2022 135  135 - 145 mmol/L Final   Potassium 05/31/2022 4.5  3.5 - 5.1 mmol/L Final   Chloride 05/31/2022 100  98 - 111 mmol/L Final   CO2 05/31/2022 27  22 - 32 mmol/L Final   Glucose, Bld 05/31/2022 116 (H)  70 - 99 mg/dL Final   Glucose reference range applies only to samples taken after fasting for at least 8 hours.   BUN 05/31/2022 14  8 - 23 mg/dL Final   Creatinine 05/31/2022 0.83  0.61 - 1.24 mg/dL Final   Calcium 05/31/2022 9.1  8.9 - 10.3 mg/dL Final   Total Protein 05/31/2022 6.8  6.5 - 8.1 g/dL Final   Albumin 05/31/2022 4.1  3.5 - 5.0 g/dL Final   AST 05/31/2022 18  15 - 41 U/L Final   ALT 05/31/2022 22  0 - 44 U/L Final   Alkaline Phosphatase 05/31/2022 57  38 - 126 U/L Final   Total Bilirubin 05/31/2022 0.3  0.3 - 1.2 mg/dL Final   GFR, Estimated 05/31/2022 >60  >60 mL/min Final   Comment: (NOTE) Calculated using the CKD-EPI Creatinine Equation (2021)    Anion gap 05/31/2022 8  5 - 15 Final   Performed at [facility], [place] [address], [place], [place] [address]   WBC Count 05/31/2022 11.1 (H)  4.0 - 10.5 K/uL Final   RBC 05/31/2022 4.45  4.22 - 5.81 MIL/uL Final   Hemoglobin 05/31/2022 12.0 (L)  13.0 - 17.0 g/dL Final   HCT 05/31/2022 36.5 (L)  39.0 - 52.0 % Final   MCV 05/31/2022 82.0  80.0 - 100.0 fL Final   MCH 05/31/2022 27.0  26.0 - 34.0 pg Final   MCHC 05/31/2022 32.9  30.0 - 36.0 g/dL Final   RDW 05/31/2022 16.7 (H)  11.5 - 15.5 % Final   Platelet Count 05/31/2022 335  150 - 400 K/uL Final   nRBC 05/31/2022 0.0  0.0 - 0.2 % Final   Neutrophils Relative % 05/31/2022 47  %  Final   Neutro Abs 05/31/2022 5.2  1.7 - 7.7 K/uL Final   Lymphocytes Relative 05/31/2022 39  % Final   Lymphs Abs 05/31/2022 4.3 (H)  0.7 - 4.0 K/uL Final   Monocytes Relative 05/31/2022 10  % Final   Monocytes Absolute 05/31/2022 1.1 (H)  0.1 - 1.0 K/uL  Final   Eosinophils Relative 05/31/2022 3  % Final   Eosinophils Absolute 05/31/2022 0.3  0.0 - 0.5 K/uL Final   Basophils Relative 05/31/2022 1  % Final   Basophils Absolute 05/31/2022 0.1  0.0 - 0.1 K/uL Final   Immature Granulocytes 05/31/2022 0  % Final   Abs Immature Granulocytes 05/31/2022 0.03  0.00 - 0.07 K/uL Final   Performed at [facility], [place] [address], [place], [place] [address]  Documentation on 05/08/2022  Component Date Value Ref Range Status   Fecal Occult Blood 05/08/2022 Negative   Final   Fecal Occult Blood 04/23/2022 Negative   Final    RADIOGRAPHIC STUDIES: I have personally reviewed the radiological images as listed and agree with the findings in the report  No results found.  ASSESSMENT/PLAN   82 y.o. male is here because of anemia.  Medical history notable for ankylosing spondylitis, CVA, diabetes mellitus type 2, hyperlipidemia, hypertension, seizure disorder, sleep apnea on CPAP  Anemia:  Patient noted to have normocytic anemia on labs drawn March 2023.  Will obtain  CBC with diff, CMP, retic count, Ferritin B12, folate, DAT, Haptoglobin, SPEP with IEP, free light chains and smear for morphology review, Hgb electrophoresis, Copper and Zinc levels.     Cancer Staging  No matching staging information was found for the patient.   No problem-specific Assessment & Plan notes found for this encounter.    Orders Placed This Encounter  Procedures   CBC with Differential ([place] Only)    Standing Status:   Future    Number of Occurrences:   1    Standing Expiration Date:   05/31/2023   CMP ([place] only)    Standing Status:   Future    Number of Occurrences:   1    Standing Expiration Date:   05/31/2023   Copper, serum    Standing Status:   Future    Number of Occurrences:   1    Standing Expiration Date:   05/31/2023   Ferritin    Standing Status:   Future    Number of Occurrences:   1    Standing Expiration Date:    05/31/2023   Folate    Standing Status:   Future    Number of Occurrences:   1    Standing Expiration Date:   05/31/2023   Haptoglobin    Standing Status:   Future    Number of Occurrences:   1    Standing Expiration Date:   05/31/2023   Zinc    Standing Status:   Future    Number of Occurrences:   1    Standing Expiration Date:   05/31/2023   Vitamin B12    Standing Status:   Future    Number of Occurrences:   1    Standing Expiration Date:   05/31/2023   Reticulocytes    Standing Status:   Future    Number of Occurrences:   1    Standing Expiration Date:   05/31/2023   Multiple Myeloma Panel (SPEP&IFE w/QIG)    Standing Status:   Future  Number of Occurrences:   1    Standing Expiration Date:   05/31/2023   Kappa/lambda light chains    Standing Status:   Future    Number of Occurrences:   1    Standing Expiration Date:   05/31/2023   Hgb Fractionation Cascade    Standing Status:   Future    Number of Occurrences:   1    Standing Expiration Date:   05/31/2023   Ambulatory referral to Gastroenterology    Referral Priority:   Routine    Referral Type:   Consultation    Referral Reason:   Specialty Services Required    Number of Visits Requested:   1   Direct antiglobulin test (not at [facility])    Standing Status:   Future    Number of Occurrences:   1    Standing Expiration Date:   05/31/2023    45  minutes was spent in patient care.  This included time spent preparing to see the patient (e.g., review of tests), obtaining and/or reviewing separately obtained history, counseling and educating the patient, ordering medications, tests, or procedures; documenting clinical information in the electronic or other health record, independently interpreting results and communicating results to the patient as well as coordination of care.       All questions were answered. The patient knows to call the clinic with any problems, questions or concerns.  This note was electronically signed.    Barbee Cough, MD  06/10/2022 4:19 PM

## 2022-06-03 ENCOUNTER — Encounter: Payer: Self-pay | Admitting: Nurse Practitioner

## 2022-06-03 ENCOUNTER — Ambulatory Visit (INDEPENDENT_AMBULATORY_CARE_PROVIDER_SITE_OTHER): Payer: Medicare Other | Admitting: Nurse Practitioner

## 2022-06-03 VITALS — BP 132/60 | HR 80 | Temp 98.0°F | Resp 16 | Ht 68.0 in | Wt 182.0 lb

## 2022-06-03 DIAGNOSIS — G629 Polyneuropathy, unspecified: Secondary | ICD-10-CM

## 2022-06-03 LAB — HAPTOGLOBIN: Haptoglobin: 123 mg/dL (ref 34–355)

## 2022-06-03 LAB — KAPPA/LAMBDA LIGHT CHAINS
Kappa free light chain: 30.5 mg/L — ABNORMAL HIGH (ref 3.3–19.4)
Kappa, lambda light chain ratio: 1.37 (ref 0.26–1.65)
Lambda free light chains: 22.2 mg/L (ref 5.7–26.3)

## 2022-06-03 MED ORDER — PREGABALIN 75 MG PO CAPS: 75.0000 mg | ORAL_CAPSULE | Freq: Two times a day (BID) | ORAL | 5 refills | Status: DC

## 2022-06-03 NOTE — Progress Notes (Signed)
Established Patient Visit  Patient: Andrew Jones   DOB: 07/17/1940   82 y.o. Male  MRN: [id number] Visit Date: 06/03/2022  Subjective:    Chief Complaint  Patient presents with   neuropathy    The lyrica is helping    HPI Neuropathy Some improvement in neuropathy with lyrica '50mg'$  BID. He denies any adverse effects with medication.  Agreed to increase lyrica dose to '75mg'$  BID. Advised about possible med side effects with dose change. He verbalized understanding. F/up in 1-17month  Hyperlipidemia associated with type 2 diabetes mellitus ([facility] 03/2022: TC 141, Trig 385, HDL 42 Current use of repatha Under the care of cardiology  Reviewed medical, surgical, and social history today  Medications: Outpatient Medications Prior to Visit  Medication Sig   Alirocumab 75 MG/ML SOAJ Inject 75 mg into the skin every 14 (fourteen) days.   amLODipine (NORVASC) 10 MG tablet Take 1 tablet (10 mg total) by mouth daily.   aspirin EC 81 MG tablet Take 1 tablet (81 mg total) by mouth daily. Swallow whole.   benazepril (LOTENSIN) 10 MG tablet Take 10 mg by mouth daily.   Calcium-Cholecalciferol (QC CALCIUM '500MG'$ -D3) 500-5 MG-MCG TABS TAKE 1 TABLET BY MOUTH TWICE A DAY WITH FOOD. GET REST OF CALCIUM FROM FOOD - DAIRY PRODUCTS ETC. TOTAL  '1200MG'$ /DAY WITH FOOD. GET REST OF CALCIUM FROM FOOD - DAIRY PRODUCTS ETC. TOTAL   '1200MG'$ /DAY   Cholecalciferol (VITAMIN D3) 125 MCG (5000 UT) CAPS Take 1 capsule (5,000 Units total) by mouth daily.   diclofenac Sodium (VOLTAREN) 1 % GEL APPLY 2 GRAMS TO AFFECTED AREA TWICE A DAY AS NEEDED FOR PAINFUL SMALL JOINTS (DO NOT TAKE DICLOFENAC TABLETS WHILE USING THIS GEL)   empagliflozin (JARDIANCE) 25 MG TABS tablet Take 1 tablet (25 mg total) by mouth daily.   etanercept (ENBREL) 50 MG/ML injection Inject 50 mg into the skin once a week.   ferrous sulfate 325 (65 FE) MG EC tablet TAKE ONE TABLET BY MOUTH MONDAY, WEDNESDAY AND  FRIDAY   fish oil-omega-3 fatty acids 1000 MG capsule Take 2 g by mouth daily.   Magnesium Oxide 420 MG TABS Take 1 tablet (420 mg total) by mouth daily after breakfast.   metFORMIN (GLUCOPHAGE) 1000 MG tablet Take 1,000 mg by mouth 2 (two) times daily with a meal.   metoprolol tartrate (LOPRESSOR) 25 MG tablet Take 25 mg by mouth 2 (two) times daily.   Multiple Vitamin (MULTIVITAMIN) tablet Take 1 tablet by mouth daily.   omeprazole (PRILOSEC) 20 MG capsule Take 20 mg by mouth daily.   RESTASIS 0.05 % ophthalmic emulsion Place 1 drop into both eyes 2 (two) times daily.   Semaglutide,0.25 or 0.'5MG'$ /DOS, 2 MG/3ML SOPN INJECT 0.'25MG'$  OF 0.'5MG'$ /0.375ML SUBCUTANEOUSLY ONCE WEEKLY FOR 4 WEEKS, THEN INJECT 0.'5MG'$  OF 0.'5MG'$ /0.375ML ONCE WEEKLY FOR DIABETES   [DISCONTINUED] pregabalin (LYRICA) 50 MG capsule 1cap at bedtime x 1week, then 1cap in Am and PM continuously   No facility-administered medications prior to visit.   Reviewed past medical and social history.   ROS per HPI above      Objective:  BP 132/60 (BP Location: Left Arm, Patient Position: Sitting, Cuff Size: Normal)   Pulse 80   Temp 98 F (36.7 C) (Temporal)   Resp 16   Ht '5\' 8"'$  (1.727 m)   Wt 182 lb (82.6 kg)   SpO2 99%  BMI 27.67 kg/m      Physical Exam Musculoskeletal:     Right lower leg: No edema.     Left lower leg: No edema.  Neurological:     Mental Status: He is alert and oriented to person, place, and time.     No results found for any visits on 06/03/22.    Assessment & Plan:    Problem List Items Addressed This Visit       Endocrine   Hyperlipidemia associated with type 2 diabetes mellitus ([place]) - Primary    Labs complted by [facility] 03/2022: TC 141, Trig 385, HDL 42 Current use of repatha Under the care of cardiology        Nervous and Auditory   Neuropathy    Some improvement in neuropathy with lyrica '50mg'$  BID. He denies any adverse effects with medication.  Agreed to increase lyrica dose  to '75mg'$  BID. Advised about possible med side effects with dose change. He verbalized understanding. F/up in 1-68month      Relevant Medications   pregabalin (LYRICA) 75 MG capsule   Return in about 3 months (around 09/03/2022) for HTN, DM, hyperlipidemia, neuropathy (fasting).     CWilfred Lacy NP

## 2022-06-03 NOTE — Assessment & Plan Note (Signed)
Some improvement in neuropathy with lyrica '50mg'$  BID. He denies any adverse effects with medication.  Agreed to increase lyrica dose to '75mg'$  BID. Advised about possible med side effects with dose change. He verbalized understanding. F/up in 1-28month

## 2022-06-03 NOTE — Assessment & Plan Note (Signed)
Labs complted by [facility] 03/2022: TC 141, Trig 385, HDL 42 Current use of repatha Under the care of cardiology

## 2022-06-03 NOTE — Patient Instructions (Signed)
Complete current lyrica dose, then increase to '75mg'$  BID. Call office if worsening weakness or new dizziness or daytime somnolence.

## 2022-06-03 NOTE — Assessment & Plan Note (Signed)
>>  ASSESSMENT AND PLAN FOR NEUROPATHY WRITTEN ON 06/03/2022 12:07 PM BY Arpan Eskelson LUM, NP  Some improvement in neuropathy with lyrica 50mg  BID. He denies any adverse effects with medication.  Agreed to increase lyrica dose to 75mg  BID. Advised about possible med side effects with dose change. He verbalized understanding. F/up in 1-70months

## 2022-06-05 ENCOUNTER — Encounter: Payer: Self-pay | Admitting: Oncology

## 2022-06-05 LAB — HGB FRACTIONATION CASCADE
Hgb A2: 1.9 % (ref 1.8–3.2)
Hgb A: 98.1 % (ref 96.4–98.8)
Hgb F: 0 % (ref 0.0–2.0)
Hgb S: 0 %

## 2022-06-05 LAB — COPPER, SERUM: Copper: 62 ug/dL — ABNORMAL LOW (ref 69–132)

## 2022-06-05 LAB — ZINC: Zinc: 63 ug/dL (ref 44–115)

## 2022-06-06 LAB — MULTIPLE MYELOMA PANEL, SERUM
Albumin SerPl Elph-Mcnc: 3.6 g/dL (ref 2.9–4.4)
Albumin/Glob SerPl: 1.4 (ref 0.7–1.7)
Alpha 1: 0.3 g/dL (ref 0.0–0.4)
Alpha2 Glob SerPl Elph-Mcnc: 0.7 g/dL (ref 0.4–1.0)
B-Globulin SerPl Elph-Mcnc: 0.9 g/dL (ref 0.7–1.3)
Gamma Glob SerPl Elph-Mcnc: 0.7 g/dL (ref 0.4–1.8)
Globulin, Total: 2.7 g/dL (ref 2.2–3.9)
IgA: 97 mg/dL (ref 61–437)
IgG (Immunoglobin G), Serum: 782 mg/dL (ref 603–1613)
IgM (Immunoglobulin M), Srm: 51 mg/dL (ref 15–143)
Total Protein ELP: 6.3 g/dL (ref 6.0–8.5)

## 2022-06-07 ENCOUNTER — Ambulatory Visit
Admission: RE | Admit: 2022-06-07 | Discharge: 2022-06-07 | Disposition: A | Discharge status: Home / Self Care | Payer: Medicare Other | Source: Ambulatory Visit | Attending: Neurosurgery | Admitting: Neurosurgery

## 2022-06-07 DIAGNOSIS — R2681 Unsteadiness on feet: Secondary | ICD-10-CM

## 2022-06-07 DIAGNOSIS — M4802 Spinal stenosis, cervical region: Secondary | ICD-10-CM | POA: Diagnosis not present

## 2022-06-10 ENCOUNTER — Encounter: Payer: Self-pay | Admitting: Oncology

## 2022-06-11 ENCOUNTER — Inpatient Hospital Stay: Payer: Medicare Other

## 2022-06-11 VITALS — BP 131/62 | HR 73 | Temp 97.8°F | Resp 12

## 2022-06-11 DIAGNOSIS — Z8673 Personal history of transient ischemic attack (TIA), and cerebral infarction without residual deficits: Secondary | ICD-10-CM | POA: Diagnosis not present

## 2022-06-11 DIAGNOSIS — D500 Iron deficiency anemia secondary to blood loss (chronic): Secondary | ICD-10-CM

## 2022-06-11 DIAGNOSIS — I10 Essential (primary) hypertension: Secondary | ICD-10-CM | POA: Diagnosis not present

## 2022-06-11 DIAGNOSIS — Z7982 Long term (current) use of aspirin: Secondary | ICD-10-CM | POA: Diagnosis not present

## 2022-06-11 DIAGNOSIS — Z79899 Other long term (current) drug therapy: Secondary | ICD-10-CM | POA: Diagnosis not present

## 2022-06-11 DIAGNOSIS — E1159 Type 2 diabetes mellitus with other circulatory complications: Secondary | ICD-10-CM | POA: Diagnosis not present

## 2022-06-11 MED ORDER — LORATADINE 10 MG PO TABS
10.0000 mg | ORAL_TABLET | Freq: Once | ORAL | Status: AC
  Administered 2022-06-11: 10 mg via ORAL
  Filled 2022-06-11: qty 1

## 2022-06-11 MED ORDER — SODIUM CHLORIDE 0.9 % IV SOLN
510.0000 mg | Freq: Once | INTRAVENOUS | Status: AC
  Administered 2022-06-11: 510 mg via INTRAVENOUS
  Filled 2022-06-11: qty 510

## 2022-06-11 MED ORDER — ACETAMINOPHEN 325 MG PO TABS
650.0000 mg | ORAL_TABLET | Freq: Once | ORAL | Status: AC
  Administered 2022-06-11: 650 mg via ORAL
  Filled 2022-06-11: qty 2

## 2022-06-11 MED ORDER — SODIUM CHLORIDE 0.9 % IV SOLN: Freq: Once | INTRAVENOUS | Status: AC

## 2022-06-11 NOTE — Progress Notes (Signed)
Patient remained for 30 min post obs, VSS at discharge.  Tolerated treatment well without incident.  Ambulated to lobby with walker.

## 2022-06-11 NOTE — Patient Instructions (Signed)

## 2022-06-16 ENCOUNTER — Other Ambulatory Visit: Payer: Medicare Other

## 2022-06-18 ENCOUNTER — Inpatient Hospital Stay: Payer: Medicare Other

## 2022-06-18 VITALS — BP 125/55 | HR 72 | Temp 97.6°F | Resp 18

## 2022-06-18 DIAGNOSIS — Z79899 Other long term (current) drug therapy: Secondary | ICD-10-CM | POA: Diagnosis not present

## 2022-06-18 DIAGNOSIS — Z8673 Personal history of transient ischemic attack (TIA), and cerebral infarction without residual deficits: Secondary | ICD-10-CM | POA: Diagnosis not present

## 2022-06-18 DIAGNOSIS — I10 Essential (primary) hypertension: Secondary | ICD-10-CM | POA: Diagnosis not present

## 2022-06-18 DIAGNOSIS — E1159 Type 2 diabetes mellitus with other circulatory complications: Secondary | ICD-10-CM | POA: Diagnosis not present

## 2022-06-18 DIAGNOSIS — D500 Iron deficiency anemia secondary to blood loss (chronic): Secondary | ICD-10-CM | POA: Diagnosis not present

## 2022-06-18 DIAGNOSIS — Z7982 Long term (current) use of aspirin: Secondary | ICD-10-CM | POA: Diagnosis not present

## 2022-06-18 MED ORDER — LORATADINE 10 MG PO TABS
10.0000 mg | ORAL_TABLET | Freq: Once | ORAL | Status: AC
  Administered 2022-06-18: 10 mg via ORAL
  Filled 2022-06-18: qty 1

## 2022-06-18 MED ORDER — SODIUM CHLORIDE 0.9 % IV SOLN
510.0000 mg | Freq: Once | INTRAVENOUS | Status: AC
  Administered 2022-06-18: 510 mg via INTRAVENOUS
  Filled 2022-06-18: qty 510

## 2022-06-18 MED ORDER — SODIUM CHLORIDE 0.9 % IV SOLN: Freq: Once | INTRAVENOUS | Status: AC

## 2022-06-18 MED ORDER — ACETAMINOPHEN 325 MG PO TABS
650.0000 mg | ORAL_TABLET | Freq: Once | ORAL | Status: AC
  Administered 2022-06-18: 650 mg via ORAL
  Filled 2022-06-18: qty 2

## 2022-06-18 NOTE — Patient Instructions (Signed)

## 2022-06-24 DIAGNOSIS — M4802 Spinal stenosis, cervical region: Secondary | ICD-10-CM | POA: Diagnosis not present

## 2022-06-24 DIAGNOSIS — G992 Myelopathy in diseases classified elsewhere: Secondary | ICD-10-CM | POA: Diagnosis not present

## 2022-06-24 DIAGNOSIS — Z6828 Body mass index (BMI) 28.0-28.9, adult: Secondary | ICD-10-CM | POA: Diagnosis not present

## 2022-06-25 ENCOUNTER — Inpatient Hospital Stay: Payer: Medicare Other

## 2022-06-25 ENCOUNTER — Other Ambulatory Visit: Payer: Self-pay

## 2022-06-26 DIAGNOSIS — K080 Exfoliation of teeth due to systemic causes: Secondary | ICD-10-CM | POA: Diagnosis not present

## 2022-06-27 ENCOUNTER — Other Ambulatory Visit: Payer: Self-pay | Admitting: Neurosurgery

## 2022-07-01 ENCOUNTER — Telehealth: Payer: Self-pay | Admitting: Oncology

## 2022-07-01 NOTE — Telephone Encounter (Signed)
Called patient per 4/8 IB message to reschedule 4/19 appointment due to surgery. Patient rescheduled and notified.

## 2022-07-04 ENCOUNTER — Telehealth: Payer: Self-pay | Admitting: Nurse Practitioner

## 2022-07-04 NOTE — Telephone Encounter (Signed)
Pt called and stated that he need you to give him a call back because when he went to the pharmacy for him and his wife he was told certain medications need authorization and he never had to in the past

## 2022-07-04 NOTE — Pre-Procedure Instructions (Signed)
Surgical Instructions    Your procedure is scheduled on July 12, 2022.  Report to [facility] Main Entrance "A" at 11:00 A.M., then check in with the Admitting office.  Call this number if you have problems the morning of surgery:  [phone]  If you have any questions prior to your surgery date call [phone]: Open Monday-Friday 8am-4pm If you experience any cold or flu symptoms such as cough, fever, chills, shortness of breath, etc. between now and your scheduled surgery, please notify us at the above number.     Remember:  Do not eat or drink after midnight the night before your surgery     Take these medicines the morning of surgery with A SIP OF WATER:  amLODipine (NORVASC)   metoprolol tartrate (LOPRESSOR)   omeprazole (PRILOSEC)   pregabalin (LYRICA)   RESTASIS 0.05 % ophthalmic emulsion    STOP taking your etanercept (ENBREL) injection three days prior to surgery.    Follow your surgeon's instructions on when to stop Aspirin.  If no instructions were given by your surgeon then you will need to call the office to get those instructions.     As of today, STOP taking any Aleve, Naproxen, Ibuprofen, Motrin, Advil, Goody's, BC's, all herbal medications, fish oil, and all vitamins.   WHAT DO I DO ABOUT MY DIABETES MEDICATION?   Do not take metFORMIN (GLUCOPHAGE) the morning of surgery.  STOP taking empagliflozin (JARDIANCE) three days prior to surgery. Your last dose will be April 15th..      STOP taking your Semaglutide [facility]) injection one week prior to surgery.   HOW TO MANAGE YOUR DIABETES BEFORE AND AFTER SURGERY  Why is it important to control my blood sugar before and after surgery? Improving blood sugar levels before and after surgery helps healing and can limit problems. A way of improving blood sugar control is eating a healthy diet by:  Eating less sugar and carbohydrates  Increasing activity/exercise  Talking with your doctor about reaching  your blood sugar goals High blood sugars (greater than 180 mg/dL) can raise your risk of infections and slow your recovery, so you will need to focus on controlling your diabetes during the weeks before surgery. Make sure that the doctor who takes care of your diabetes knows about your planned surgery including the date and location.  How do I manage my blood sugar before surgery? Check your blood sugar at least 4 times a day, starting 2 days before surgery, to make sure that the level is not too high or low.  Check your blood sugar the morning of your surgery when you wake up and every 2 hours until you get to the Short Stay unit.  If your blood sugar is less than 70 mg/dL, you will need to treat for low blood sugar: Do not take insulin. Treat a low blood sugar (less than 70 mg/dL) with  cup of clear juice (cranberry or apple), 4 glucose tablets, OR glucose gel. Recheck blood sugar in 15 minutes after treatment (to make sure it is greater than 70 mg/dL). If your blood sugar is not greater than 70 mg/dL on recheck, call [phone] for further instructions. Report your blood sugar to the short stay nurse when you get to Short Stay.  If you are admitted to the hospital after surgery: Your blood sugar will be checked by the staff and you will probably be given insulin after surgery (instead of oral diabetes medicines) to make sure you have good blood sugar  levels. The goal for blood sugar control after surgery is 80-180 mg/dL.                      Do NOT Smoke (Tobacco/Vaping) for 24 hours prior to your procedure.  If you use a CPAP at night, you may bring your mask/headgear for your overnight stay.   Contacts, glasses, piercing's, hearing aid's, dentures or partials may not be worn into surgery, please bring cases for these belongings.    For patients admitted to the hospital, discharge time will be determined by your treatment team.   Patients discharged the day of surgery will not be  allowed to drive home, and someone needs to stay with them for 24 hours.  SURGICAL WAITING ROOM VISITATION Patients having surgery or a procedure may have no more than 2 support people in the waiting area - these visitors may rotate.   Children under the age of 26 must have an adult with them who is not the patient. If the patient needs to stay at the hospital during part of their recovery, the visitor guidelines for inpatient rooms apply. Pre-op nurse will coordinate an appropriate time for 1 support person to accompany patient in pre-op.  This support person may not rotate.   Please refer to the [facility] website for the visitor guidelines for Inpatients (after your surgery is over and you are in a regular room).    Please follow the instructions on the handout you received at your pre-admission appointment about preparing for your upcoming surgery using the CHG surgical soap. If you have any questions or concerns, please call one of the numbers listed on the first page of this paperwork.    If you received a COVID test during your pre-op visit  it is requested that you wear a mask when out in public, stay away from anyone that may not be feeling well and notify your surgeon if you develop symptoms. If you have been in contact with anyone that has tested positive in the last 10 days please notify you surgeon.

## 2022-07-05 ENCOUNTER — Encounter (HOSPITAL_COMMUNITY)
Admission: RE | Admit: 2022-07-05 | Discharge: 2022-07-05 | Disposition: A | Discharge status: Home / Self Care | Payer: Medicare Other | Source: Ambulatory Visit | Attending: Neurosurgery | Admitting: Neurosurgery

## 2022-07-05 ENCOUNTER — Other Ambulatory Visit: Payer: Self-pay

## 2022-07-05 ENCOUNTER — Encounter (HOSPITAL_COMMUNITY): Payer: Self-pay

## 2022-07-05 VITALS — BP 148/63 | HR 88 | Temp 97.6°F | Resp 18 | Ht 67.0 in | Wt 178.6 lb

## 2022-07-05 DIAGNOSIS — Z01818 Encounter for other preprocedural examination: Secondary | ICD-10-CM | POA: Diagnosis not present

## 2022-07-05 DIAGNOSIS — G4733 Obstructive sleep apnea (adult) (pediatric): Secondary | ICD-10-CM | POA: Diagnosis not present

## 2022-07-05 DIAGNOSIS — I10 Essential (primary) hypertension: Secondary | ICD-10-CM | POA: Insufficient documentation

## 2022-07-05 DIAGNOSIS — G40909 Epilepsy, unspecified, not intractable, without status epilepticus: Secondary | ICD-10-CM | POA: Insufficient documentation

## 2022-07-05 DIAGNOSIS — Z8673 Personal history of transient ischemic attack (TIA), and cerebral infarction without residual deficits: Secondary | ICD-10-CM | POA: Diagnosis not present

## 2022-07-05 DIAGNOSIS — I444 Left anterior fascicular block: Secondary | ICD-10-CM | POA: Diagnosis not present

## 2022-07-05 DIAGNOSIS — M4712 Other spondylosis with myelopathy, cervical region: Secondary | ICD-10-CM | POA: Insufficient documentation

## 2022-07-05 DIAGNOSIS — E119 Type 2 diabetes mellitus without complications: Secondary | ICD-10-CM | POA: Insufficient documentation

## 2022-07-05 DIAGNOSIS — M4802 Spinal stenosis, cervical region: Secondary | ICD-10-CM | POA: Diagnosis not present

## 2022-07-05 HISTORY — DX: Polyneuropathy, unspecified: G62.9

## 2022-07-05 LAB — BASIC METABOLIC PANEL
Anion gap: 9 (ref 5–15)
BUN: 13 mg/dL (ref 8–23)
CO2: 25 mmol/L (ref 22–32)
Calcium: 9.3 mg/dL (ref 8.9–10.3)
Chloride: 101 mmol/L (ref 98–111)
Creatinine, Ser: 0.84 mg/dL (ref 0.61–1.24)
GFR, Estimated: >60 mL/min (ref >60)
Glucose, Bld: 113 mg/dL — ABNORMAL HIGH (ref 70–99)
Potassium: 4.1 mmol/L (ref 3.5–5.1)
Sodium: 135 mmol/L (ref 135–145)

## 2022-07-05 LAB — HEMOGLOBIN A1C
Hgb A1c MFr Bld: 5.9 % — ABNORMAL HIGH (ref 4.8–5.6)
Mean Plasma Glucose: 122.63 mg/dL

## 2022-07-05 LAB — CBC
HCT: 41.8 % (ref 39.0–52.0)
Hemoglobin: 13.8 g/dL (ref 13.0–17.0)
MCH: 28.3 pg (ref 26.0–34.0)
MCHC: 33 g/dL (ref 30.0–36.0)
MCV: 85.7 fL (ref 80.0–100.0)
Platelets: 319 10*3/uL (ref 150–400)
RBC: 4.88 MIL/uL (ref 4.22–5.81)
RDW: 17.3 % — ABNORMAL HIGH (ref 11.5–15.5)
WBC: 11.6 10*3/uL — ABNORMAL HIGH (ref 4.0–10.5)
nRBC: 0 % (ref 0.0–0.2)

## 2022-07-05 LAB — SURGICAL PCR SCREEN
MRSA, PCR: NEGATIVE
Staphylococcus aureus: NEGATIVE

## 2022-07-05 LAB — TYPE AND SCREEN
ABO/RH(D): O POS
Antibody Screen: NEGATIVE

## 2022-07-05 LAB — GLUCOSE, CAPILLARY: Glucose-Capillary: 124 mg/dL — ABNORMAL HIGH (ref 70–99)

## 2022-07-05 NOTE — Progress Notes (Addendum)
PCP - Anne Ng, NP Cardiologist - Denies  PPM/ICD - Denies Device Orders - n/a Rep Notified - n/a  Chest x-ray - n/a EKG - 07/05/2022 Stress Test - Many years ago after seizure. Results normal ECHO - 06/13/2009 Cardiac Cath - Denies  Sleep Study - Yes. +OSA. Wears CPAP nightly. Pressure setting 8  Pt is DM2. He does not have a meter at home thus doesn't check. He does not know his fasting range. CBG at pat 124. So far today he has had cheerios with low fat milk, nuts, blueberries, protein bar, Nabs, and Tangerines. Since he is unable to check his blood sugar morning of surgery. Pt instructed him to have a half a cup of clear juice ONLY IF he feels symptomatic with low blood sugar. Pt understood instructions  Last dose of GLP1 agonist-  Saturday, April 6th GLP1 instructions: Pt instructed to NOT take his dose of Ozempic this Saturday, April 13th. Pt understood instructions  Blood Thinner Instructions: n/a Aspirin Instructions: Pt has already stopped his ASA per surgeons instructions. His last dose was April 10th.  NPO after midnight  COVID TEST- n/a   Anesthesia review: Yes. Abnormal EKG. Hx of HTN, CVAs (no deficits), DM (that is really only checked every 6 months at [place]). He also has not had any invasive surgeries since he was 82 years old.   Patient denies shortness of breath, fever, cough and chest pain at PAT appointment. Pt denies any respiratory illness/infection in the last two months.   All instructions explained to the patient, with a verbal understanding of the material. Patient agrees to go over the instructions while at home for a better understanding. Patient also instructed to self quarantine after being tested for COVID-19. The opportunity to ask questions was provided.

## 2022-07-08 NOTE — Anesthesia Preprocedure Evaluation (Addendum)
Anesthesia Evaluation  Patient identified by MRN, date of birth, ID band Patient awake    Reviewed: Allergy & Precautions, NPO status , Patient's Chart, lab work & pertinent test results  History of Anesthesia Complications Negative for: history of anesthetic complications  Airway Mallampati: II  TM Distance: >3 FB Neck ROM: Full    Dental  (+) Dental Advisory Given   Pulmonary sleep apnea , former smoker   Pulmonary exam normal        Cardiovascular hypertension, Pt. on medications and Pt. on home beta blockers Normal cardiovascular exam     Neuro/Psych Seizures -, Well Controlled,  PSYCHIATRIC DISORDERS  Depression     Neuromuscular disease CVA, No Residual Symptoms    GI/Hepatic Neg liver ROS,GERD  Medicated and Controlled,,  Endo/Other  diabetes, Type 2, Oral Hypoglycemic Agents    Renal/GU negative Renal ROS     Musculoskeletal  (+) Arthritis ,   Ankylosing spondylitis    Abdominal   Peds  Hematology negative hematology ROS (+)   Anesthesia Other Findings On GLP-1a   Reproductive/Obstetrics                             Anesthesia Physical Anesthesia Plan  ASA: 3  Anesthesia Plan: General   Post-op Pain Management: Tylenol PO (pre-op)*   Induction: Intravenous  PONV Risk Score and Plan: 2 and Treatment may vary due to age or medical condition and Ondansetron  Airway Management Planned: Oral ETT and Video Laryngoscope Planned  Additional Equipment: None  Intra-op Plan:   Post-operative Plan: Extubation in OR  Informed Consent: I have reviewed the patients History and Physical, chart, labs and discussed the procedure including the risks, benefits and alternatives for the proposed anesthesia with the patient or authorized representative who has indicated his/her understanding and acceptance.     Dental advisory given  Plan Discussed with: CRNA and  Anesthesiologist  Anesthesia Plan Comments:         Anesthesia Quick Evaluation

## 2022-07-08 NOTE — Progress Notes (Signed)
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 PMH remarkable for ankylosing spondylitis, CVA, Type 2 DM, HTN, seizure disorder, OSA on CPAP.  #HTN -Controlled on current regimen -EKG obtained at PAT visit. Has new LAD and intra-ventricular conduction delay which is non-specific. No complaints of CP/SOB.  -Was stable at recent PCP visit on 06/03/22  #Type 2 DM -HgA1c on 5.9  #Hx of CVA -No residual deficits  #Hx of seizures -Has been weaned off anti-epileptics by Neurology. Previously took Dilantin  #OSA -Uses CPAP  VS: BP (!) 148/63   Pulse 88   Temp 36.4 C (Oral)   Resp 18   Ht 5\' 7"  (1.702 m)   Wt 81 kg   SpO2 100%   BMI 27.97 kg/m   PROVIDERS: Nche, Bonna Gains, NP   LABS: Labs reviewed: Acceptable for surgery. (all labs ordered are listed, but only abnormal results are displayed)  Labs Reviewed  GLUCOSE, CAPILLARY - Abnormal; Notable for the following components:      Result Value   Glucose-Capillary 124 (*)    All other components within normal limits  HEMOGLOBIN A1C - Abnormal; Notable for the following components:   Hgb A1c MFr Bld 5.9 (*)    All other components within normal limits  BASIC METABOLIC PANEL - Abnormal; Notable for the following components:   Glucose, Bld 113 (*)    All other components within normal limits  CBC - Abnormal; Notable for the following components:   WBC 11.6 (*)    RDW 17.3 (*)    All other components within normal limits  SURGICAL PCR SCREEN  TYPE AND SCREEN     IMAGES: MRI 06/10/22:   IMPRESSION: 1. Degenerative changes of the cervical spine, more pronounced at C5-6 where there is severe spinal canal stenosis and  severe bilateral neural foraminal narrowing. There is associated mass effect on the cord with cord edema at this level. 2. Moderate spinal canal stenosis, moderate right and severe left neural foraminal narrowing at C6-7. 3. Moderate bilateral neural foraminal narrowing at C3-4.  EKG 07/05/22:  NSR Left axis deviation Left anterior fascicular block Non-specific intra-ventricular conduction delay. Borderline criteria LVH  Left anterior fascicular block, Non-specific intra-ventricular conduction delay. Borderline criteria LVH appear new  CV: n/a   Past Medical History:  Diagnosis Date   Ankylosing spondylitis    back, hips - on embrel 2000-2008, resumed 12/2012   CVA (cerebral vascular accident) 1999, 2002   no residual problems   Diabetes mellitus, type 2    Dyslipidemia    Hypertension    Neuropathy    Bilateral Hands/Feet   Osteoarthritis    Seizure disorder 05/23/2009   Sleep apnea    uses c-pap    Past Surgical History:  Procedure Laterality Date   CATARACT EXTRACTION W/ INTRAOCULAR LENS IMPLANT Bilateral 2022   COLONOSCOPY  2015   INGUINAL HERNIA REPAIR Left 1947    MEDICATIONS:  Alirocumab 75 MG/ML SOAJ   amLODipine (NORVASC) 10 MG tablet   aspirin EC 81 MG tablet   benazepril (LOTENSIN) 10 MG tablet   Calcium-Cholecalciferol (QC CALCIUM 500MG -D3) 500-5 MG-MCG TABS   Cholecalciferol (VITAMIN D3)  125 MCG (5000 UT) CAPS   empagliflozin (JARDIANCE) 25 MG TABS tablet   etanercept (ENBREL) 50 MG/ML injection   fish oil-omega-3 fatty acids 1000 MG capsule   Magnesium Oxide 420 MG TABS   metFORMIN (GLUCOPHAGE) 1000 MG tablet   metoprolol tartrate (LOPRESSOR) 25 MG tablet   Multiple Vitamin (MULTIVITAMIN) tablet   omeprazole (PRILOSEC) 20 MG capsule   pregabalin (LYRICA) 75 MG capsule   RESTASIS 0.05 % ophthalmic emulsion   Semaglutide,0.25 or 0.5MG /DOS, 2 MG/3ML SOPN   No current facility-administered medications for this encounter.   Marcille Blanco [facility] Surgical Short Stay/Anesthesiology [facility] Phone [phone] 07/08/2022 2:15 PM

## 2022-07-12 ENCOUNTER — Inpatient Hospital Stay: Payer: Medicare Other

## 2022-07-12 ENCOUNTER — Ambulatory Visit (HOSPITAL_BASED_OUTPATIENT_CLINIC_OR_DEPARTMENT_OTHER): Payer: Medicare Other | Admitting: Anesthesiology

## 2022-07-12 ENCOUNTER — Encounter (HOSPITAL_COMMUNITY): Payer: Self-pay | Admitting: Neurosurgery

## 2022-07-12 ENCOUNTER — Inpatient Hospital Stay: Payer: Medicare Other | Admitting: Oncology

## 2022-07-12 ENCOUNTER — Ambulatory Visit (HOSPITAL_COMMUNITY): Payer: Medicare Other

## 2022-07-12 ENCOUNTER — Encounter (HOSPITAL_COMMUNITY)
Admission: RE | Disposition: A | Discharge status: Home / Self Care | Payer: Self-pay | Source: Ambulatory Visit | Attending: Neurosurgery

## 2022-07-12 ENCOUNTER — Other Ambulatory Visit: Payer: Self-pay

## 2022-07-12 ENCOUNTER — Ambulatory Visit (HOSPITAL_COMMUNITY): Payer: Medicare Other | Admitting: Medical

## 2022-07-12 ENCOUNTER — Observation Stay (HOSPITAL_COMMUNITY)
Admission: RE | Admit: 2022-07-12 | Discharge: 2022-07-13 | Disposition: A | Discharge status: Home / Self Care | Payer: Medicare Other | Source: Ambulatory Visit | Attending: Neurosurgery | Admitting: Neurosurgery

## 2022-07-12 DIAGNOSIS — G959 Disease of spinal cord, unspecified: Secondary | ICD-10-CM | POA: Diagnosis not present

## 2022-07-12 DIAGNOSIS — Z79899 Other long term (current) drug therapy: Secondary | ICD-10-CM | POA: Insufficient documentation

## 2022-07-12 DIAGNOSIS — Z87891 Personal history of nicotine dependence: Secondary | ICD-10-CM | POA: Insufficient documentation

## 2022-07-12 DIAGNOSIS — M4802 Spinal stenosis, cervical region: Secondary | ICD-10-CM | POA: Diagnosis not present

## 2022-07-12 DIAGNOSIS — Z7984 Long term (current) use of oral hypoglycemic drugs: Secondary | ICD-10-CM | POA: Diagnosis not present

## 2022-07-12 DIAGNOSIS — I10 Essential (primary) hypertension: Secondary | ICD-10-CM | POA: Diagnosis not present

## 2022-07-12 DIAGNOSIS — E1140 Type 2 diabetes mellitus with diabetic neuropathy, unspecified: Secondary | ICD-10-CM | POA: Insufficient documentation

## 2022-07-12 DIAGNOSIS — G992 Myelopathy in diseases classified elsewhere: Secondary | ICD-10-CM | POA: Diagnosis present

## 2022-07-12 DIAGNOSIS — M50022 Cervical disc disorder at C5-C6 level with myelopathy: Secondary | ICD-10-CM | POA: Diagnosis not present

## 2022-07-12 DIAGNOSIS — E119 Type 2 diabetes mellitus without complications: Secondary | ICD-10-CM

## 2022-07-12 DIAGNOSIS — Z7982 Long term (current) use of aspirin: Secondary | ICD-10-CM | POA: Insufficient documentation

## 2022-07-12 DIAGNOSIS — Z8673 Personal history of transient ischemic attack (TIA), and cerebral infarction without residual deficits: Secondary | ICD-10-CM | POA: Insufficient documentation

## 2022-07-12 DIAGNOSIS — Z981 Arthrodesis status: Secondary | ICD-10-CM | POA: Diagnosis not present

## 2022-07-12 HISTORY — PX: ANTERIOR CERVICAL DECOMP/DISCECTOMY FUSION: SHX1161

## 2022-07-12 LAB — GLUCOSE, CAPILLARY
Glucose-Capillary: 142 mg/dL — ABNORMAL HIGH (ref 70–99)
Glucose-Capillary: 117 mg/dL — ABNORMAL HIGH (ref 70–99)
Glucose-Capillary: 165 mg/dL — ABNORMAL HIGH (ref 70–99)
Glucose-Capillary: 271 mg/dL — ABNORMAL HIGH (ref 70–99)

## 2022-07-12 LAB — ABO/RH: ABO/RH(D): O POS

## 2022-07-12 SURGERY — ANTERIOR CERVICAL DECOMPRESSION/DISCECTOMY FUSION 2 LEVELS
Anesthesia: General

## 2022-07-12 MED ORDER — LIDOCAINE 2% (20 MG/ML) 5 ML SYRINGE
INTRAMUSCULAR | Status: DC | PRN
  Administered 2022-07-12: 60 mg via INTRAVENOUS

## 2022-07-12 MED ORDER — OXYCODONE HCL 5 MG PO TABS: 5.0000 mg | ORAL_TABLET | ORAL | Status: DC | PRN

## 2022-07-12 MED ORDER — ONDANSETRON HCL 4 MG/2ML IJ SOLN
INTRAMUSCULAR | Status: AC
  Filled 2022-07-12: qty 2

## 2022-07-12 MED ORDER — CEFAZOLIN SODIUM-DEXTROSE 2-4 GM/100ML-% IV SOLN
2.0000 g | INTRAVENOUS | Status: AC
  Administered 2022-07-12: 2 g via INTRAVENOUS
  Filled 2022-07-12: qty 100

## 2022-07-12 MED ORDER — LIDOCAINE-EPINEPHRINE 1 %-1:100000 IJ SOLN
INTRAMUSCULAR | Status: AC
  Filled 2022-07-12: qty 1

## 2022-07-12 MED ORDER — SENNA 8.6 MG PO TABS
1.0000 | ORAL_TABLET | Freq: Two times a day (BID) | ORAL | Status: DC
  Administered 2022-07-12: 8.6 mg via ORAL
  Filled 2022-07-12: qty 1

## 2022-07-12 MED ORDER — POLYETHYLENE GLYCOL 3350 17 G PO PACK: 17.0000 g | PACK | Freq: Every day | ORAL | Status: DC | PRN

## 2022-07-12 MED ORDER — FENTANYL CITRATE (PF) 250 MCG/5ML IJ SOLN
INTRAMUSCULAR | Status: DC | PRN
  Administered 2022-07-12 (×5): 50 ug via INTRAVENOUS

## 2022-07-12 MED ORDER — BUPIVACAINE HCL (PF) 0.5 % IJ SOLN
INTRAMUSCULAR | Status: AC
  Filled 2022-07-12: qty 30

## 2022-07-12 MED ORDER — AMLODIPINE BESYLATE 10 MG PO TABS: 10.0000 mg | ORAL_TABLET | Freq: Every day | ORAL | Status: DC

## 2022-07-12 MED ORDER — MAGNESIUM OXIDE -MG SUPPLEMENT 400 (240 MG) MG PO TABS: 400.0000 mg | ORAL_TABLET | Freq: Every day | ORAL | Status: DC

## 2022-07-12 MED ORDER — SODIUM CHLORIDE 0.9% FLUSH: 3.0000 mL | INTRAVENOUS | Status: DC | PRN

## 2022-07-12 MED ORDER — OXYCODONE HCL 5 MG PO TABS
10.0000 mg | ORAL_TABLET | ORAL | Status: DC | PRN
  Administered 2022-07-12 – 2022-07-13 (×3): 10 mg via ORAL
  Filled 2022-07-12 (×3): qty 2

## 2022-07-12 MED ORDER — LACTATED RINGERS IV SOLN: INTRAVENOUS | Status: DC

## 2022-07-12 MED ORDER — CHLORHEXIDINE GLUCONATE 0.12 % MT SOLN
15.0000 mL | Freq: Once | OROMUCOSAL | Status: AC
  Administered 2022-07-12: 15 mL via OROMUCOSAL
  Filled 2022-07-12: qty 15

## 2022-07-12 MED ORDER — ROCURONIUM BROMIDE 10 MG/ML (PF) SYRINGE
PREFILLED_SYRINGE | INTRAVENOUS | Status: AC
  Filled 2022-07-12: qty 10

## 2022-07-12 MED ORDER — FENTANYL CITRATE (PF) 250 MCG/5ML IJ SOLN
INTRAMUSCULAR | Status: AC
  Filled 2022-07-12: qty 5

## 2022-07-12 MED ORDER — BENAZEPRIL HCL 20 MG PO TABS: 10.0000 mg | ORAL_TABLET | Freq: Every day | ORAL | Status: DC

## 2022-07-12 MED ORDER — INSULIN ASPART 100 UNIT/ML IJ SOLN: 0.0000 [IU] | Freq: Three times a day (TID) | INTRAMUSCULAR | Status: DC

## 2022-07-12 MED ORDER — LIDOCAINE-EPINEPHRINE 1 %-1:100000 IJ SOLN
INTRAMUSCULAR | Status: DC | PRN
  Administered 2022-07-12: 4 mL

## 2022-07-12 MED ORDER — CYCLOSPORINE 0.05 % OP EMUL
1.0000 [drp] | Freq: Two times a day (BID) | OPHTHALMIC | Status: DC
  Administered 2022-07-12: 1 [drp] via OPHTHALMIC
  Filled 2022-07-12 (×2): qty 30

## 2022-07-12 MED ORDER — SODIUM CHLORIDE (PF) 0.9 % IJ SOLN
INTRAMUSCULAR | Status: AC
  Filled 2022-07-12: qty 10

## 2022-07-12 MED ORDER — METOPROLOL TARTRATE 25 MG PO TABS
25.0000 mg | ORAL_TABLET | Freq: Two times a day (BID) | ORAL | Status: DC
  Administered 2022-07-12: 25 mg via ORAL
  Filled 2022-07-12: qty 1

## 2022-07-12 MED ORDER — PHENYLEPHRINE HCL-NACL 20-0.9 MG/250ML-% IV SOLN
INTRAVENOUS | Status: DC | PRN
  Administered 2022-07-12: 25 ug/min via INTRAVENOUS

## 2022-07-12 MED ORDER — MENTHOL 3 MG MT LOZG
1.0000 | LOZENGE | OROMUCOSAL | Status: DC | PRN
  Filled 2022-07-12: qty 9

## 2022-07-12 MED ORDER — SUGAMMADEX SODIUM 200 MG/2ML IV SOLN
INTRAVENOUS | Status: DC | PRN
  Administered 2022-07-12: 200 mg via INTRAVENOUS

## 2022-07-12 MED ORDER — PHENOL 1.4 % MT LIQD: 1.0000 | OROMUCOSAL | Status: DC | PRN

## 2022-07-12 MED ORDER — SODIUM CHLORIDE 0.9% FLUSH: 3.0000 mL | Freq: Two times a day (BID) | INTRAVENOUS | Status: DC

## 2022-07-12 MED ORDER — INSULIN ASPART 100 UNIT/ML IJ SOLN: 0.0000 [IU] | INTRAMUSCULAR | Status: DC | PRN

## 2022-07-12 MED ORDER — ACETAMINOPHEN 10 MG/ML IV SOLN
INTRAVENOUS | Status: AC
  Filled 2022-07-12: qty 100

## 2022-07-12 MED ORDER — BUPIVACAINE HCL 0.5 % IJ SOLN
INTRAMUSCULAR | Status: DC | PRN
  Administered 2022-07-12: 4 mL

## 2022-07-12 MED ORDER — EPHEDRINE 5 MG/ML INJ
INTRAVENOUS | Status: AC
  Filled 2022-07-12: qty 5

## 2022-07-12 MED ORDER — SODIUM CHLORIDE 0.9 % IV SOLN: 250.0000 mL | INTRAVENOUS | Status: DC

## 2022-07-12 MED ORDER — ONDANSETRON HCL 4 MG/2ML IJ SOLN
INTRAMUSCULAR | Status: DC | PRN
  Administered 2022-07-12 (×2): 4 mg via INTRAVENOUS

## 2022-07-12 MED ORDER — DEXAMETHASONE SODIUM PHOSPHATE 10 MG/ML IJ SOLN
INTRAMUSCULAR | Status: AC
  Filled 2022-07-12: qty 1

## 2022-07-12 MED ORDER — ACETAMINOPHEN 325 MG PO TABS: 650.0000 mg | ORAL_TABLET | ORAL | Status: DC | PRN

## 2022-07-12 MED ORDER — ACETAMINOPHEN 500 MG PO TABS: 1000.0000 mg | ORAL_TABLET | Freq: Once | ORAL | Status: DC

## 2022-07-12 MED ORDER — LIDOCAINE 2% (20 MG/ML) 5 ML SYRINGE
INTRAMUSCULAR | Status: AC
  Filled 2022-07-12: qty 10

## 2022-07-12 MED ORDER — CHLORHEXIDINE GLUCONATE CLOTH 2 % EX PADS: 6.0000 | MEDICATED_PAD | Freq: Once | CUTANEOUS | Status: DC

## 2022-07-12 MED ORDER — ONDANSETRON HCL 4 MG PO TABS: 4.0000 mg | ORAL_TABLET | Freq: Four times a day (QID) | ORAL | Status: DC | PRN

## 2022-07-12 MED ORDER — CEFAZOLIN SODIUM 1 G IJ SOLR
INTRAMUSCULAR | Status: AC
  Filled 2022-07-12: qty 20

## 2022-07-12 MED ORDER — OXYCODONE HCL 5 MG PO TABS: 5.0000 mg | ORAL_TABLET | Freq: Once | ORAL | Status: DC | PRN

## 2022-07-12 MED ORDER — METFORMIN HCL 500 MG PO TABS
1000.0000 mg | ORAL_TABLET | Freq: Two times a day (BID) | ORAL | Status: DC
  Administered 2022-07-13: 1000 mg via ORAL
  Filled 2022-07-12: qty 2

## 2022-07-12 MED ORDER — THROMBIN 5000 UNITS EX SOLR
OROMUCOSAL | Status: DC | PRN
  Administered 2022-07-12: 5 mL via TOPICAL

## 2022-07-12 MED ORDER — PANTOPRAZOLE SODIUM 40 MG PO TBEC: 40.0000 mg | DELAYED_RELEASE_TABLET | Freq: Every day | ORAL | Status: DC

## 2022-07-12 MED ORDER — PROPOFOL 10 MG/ML IV BOLUS
INTRAVENOUS | Status: AC
  Filled 2022-07-12: qty 20

## 2022-07-12 MED ORDER — ONDANSETRON HCL 4 MG/2ML IJ SOLN: 4.0000 mg | Freq: Once | INTRAMUSCULAR | Status: DC | PRN

## 2022-07-12 MED ORDER — PREGABALIN 75 MG PO CAPS
75.0000 mg | ORAL_CAPSULE | Freq: Two times a day (BID) | ORAL | Status: DC
  Administered 2022-07-12: 75 mg via ORAL
  Filled 2022-07-12: qty 1

## 2022-07-12 MED ORDER — ADULT MULTIVITAMIN W/MINERALS CH
1.0000 | ORAL_TABLET | Freq: Every day | ORAL | Status: DC
  Administered 2022-07-12: 1 via ORAL
  Filled 2022-07-12: qty 1

## 2022-07-12 MED ORDER — SODIUM CHLORIDE 0.9 % IV SOLN: INTRAVENOUS | Status: DC

## 2022-07-12 MED ORDER — CEFAZOLIN SODIUM-DEXTROSE 2-4 GM/100ML-% IV SOLN
2.0000 g | Freq: Three times a day (TID) | INTRAVENOUS | Status: AC
  Administered 2022-07-12 – 2022-07-13 (×2): 2 g via INTRAVENOUS
  Filled 2022-07-12 (×2): qty 100

## 2022-07-12 MED ORDER — EMPAGLIFLOZIN 25 MG PO TABS: 25.0000 mg | ORAL_TABLET | Freq: Every day | ORAL | Status: DC

## 2022-07-12 MED ORDER — ACETAMINOPHEN 10 MG/ML IV SOLN
INTRAVENOUS | Status: DC | PRN
  Administered 2022-07-12: 1000 mg via INTRAVENOUS

## 2022-07-12 MED ORDER — BISACODYL 10 MG RE SUPP: 10.0000 mg | Freq: Every day | RECTAL | Status: DC | PRN

## 2022-07-12 MED ORDER — PROPOFOL 10 MG/ML IV BOLUS
INTRAVENOUS | Status: DC | PRN
  Administered 2022-07-12: 120 mg via INTRAVENOUS

## 2022-07-12 MED ORDER — OXYCODONE HCL 5 MG/5ML PO SOLN: 5.0000 mg | Freq: Once | ORAL | Status: DC | PRN

## 2022-07-12 MED ORDER — THROMBIN 5000 UNITS EX SOLR
CUTANEOUS | Status: AC
  Filled 2022-07-12: qty 5000

## 2022-07-12 MED ORDER — ROCURONIUM BROMIDE 50 MG/5ML IV SOSY
PREFILLED_SYRINGE | INTRAVENOUS | Status: DC | PRN
  Administered 2022-07-12: 50 mg via INTRAVENOUS
  Administered 2022-07-12: 20 mg via INTRAVENOUS
  Administered 2022-07-12: 30 mg via INTRAVENOUS

## 2022-07-12 MED ORDER — FLEET ENEMA 7-19 GM/118ML RE ENEM: 1.0000 | ENEMA | Freq: Once | RECTAL | Status: DC | PRN

## 2022-07-12 MED ORDER — METHOCARBAMOL 500 MG PO TABS
500.0000 mg | ORAL_TABLET | Freq: Four times a day (QID) | ORAL | Status: DC | PRN
  Administered 2022-07-12 – 2022-07-13 (×2): 500 mg via ORAL
  Filled 2022-07-12 (×2): qty 1

## 2022-07-12 MED ORDER — METHOCARBAMOL 1000 MG/10ML IJ SOLN: 500.0000 mg | Freq: Four times a day (QID) | INTRAVENOUS | Status: DC | PRN

## 2022-07-12 MED ORDER — FENTANYL CITRATE (PF) 100 MCG/2ML IJ SOLN
25.0000 ug | INTRAMUSCULAR | Status: DC | PRN
  Administered 2022-07-12: 25 ug via INTRAVENOUS

## 2022-07-12 MED ORDER — ONDANSETRON HCL 4 MG/2ML IJ SOLN: 4.0000 mg | Freq: Four times a day (QID) | INTRAMUSCULAR | Status: DC | PRN

## 2022-07-12 MED ORDER — DOCUSATE SODIUM 100 MG PO CAPS
100.0000 mg | ORAL_CAPSULE | Freq: Two times a day (BID) | ORAL | Status: DC
  Administered 2022-07-12: 100 mg via ORAL
  Filled 2022-07-12: qty 1

## 2022-07-12 MED ORDER — 0.9 % SODIUM CHLORIDE (POUR BTL) OPTIME
TOPICAL | Status: DC | PRN
  Administered 2022-07-12: 1000 mL

## 2022-07-12 MED ORDER — THROMBIN 20000 UNITS EX SOLR
CUTANEOUS | Status: AC
  Filled 2022-07-12: qty 20000

## 2022-07-12 MED ORDER — ACETAMINOPHEN 650 MG RE SUPP: 650.0000 mg | RECTAL | Status: DC | PRN

## 2022-07-12 MED ORDER — DEXAMETHASONE SODIUM PHOSPHATE 10 MG/ML IJ SOLN
INTRAMUSCULAR | Status: DC | PRN
  Administered 2022-07-12: 10 mg via INTRAVENOUS

## 2022-07-12 MED ORDER — ORAL CARE MOUTH RINSE: 15.0000 mL | Freq: Once | OROMUCOSAL | Status: AC

## 2022-07-12 MED ORDER — FENTANYL CITRATE (PF) 100 MCG/2ML IJ SOLN
INTRAMUSCULAR | Status: AC
  Filled 2022-07-12: qty 2

## 2022-07-12 MED ORDER — PHENYLEPHRINE 80 MCG/ML (10ML) SYRINGE FOR IV PUSH (FOR BLOOD PRESSURE SUPPORT)
PREFILLED_SYRINGE | INTRAVENOUS | Status: AC
  Filled 2022-07-12: qty 10

## 2022-07-12 SURGICAL SUPPLY — 66 items
ADH SKN CLS APL DERMABOND .7 (GAUZE/BANDAGES/DRESSINGS) ×1
APL SKNCLS STERI-STRIP NONHPOA (GAUZE/BANDAGES/DRESSINGS)
BAG COUNTER SPONGE SURGICOUNT (BAG) ×1 IMPLANT
BAG SPNG CNTER NS LX DISP (BAG) ×2
BAND INSRT 18 STRL LF DISP RB (MISCELLANEOUS) ×2
BAND RUBBER #18 3X1/16 STRL (MISCELLANEOUS) ×2 IMPLANT
BENZOIN TINCTURE PRP APPL 2/3 (GAUZE/BANDAGES/DRESSINGS) IMPLANT
BLADE CLIPPER SURG (BLADE) IMPLANT
BLADE SURG 11 STRL SS (BLADE) ×1 IMPLANT
BLADE ULTRA TIP 2M (BLADE) IMPLANT
BNDG GAUZE DERMACEA FLUFF 4 (GAUZE/BANDAGES/DRESSINGS) IMPLANT
BNDG GZE DERMACEA 4 6PLY (GAUZE/BANDAGES/DRESSINGS)
BUR MATCHSTICK NEURO 3.0 LAGG (BURR) ×1 IMPLANT
CANISTER SUCT 3000ML PPV (MISCELLANEOUS) ×1 IMPLANT
DERMABOND ADVANCED .7 DNX12 (GAUZE/BANDAGES/DRESSINGS) ×1 IMPLANT
DEVICE ENDSKLTN IMPL 16X14X7X6 (Cage) IMPLANT
DEVICE ENDSKLTN TC NANOLCK 6MM (Cage) IMPLANT
DRAIN CHANNEL 10M FLAT 3/4 FLT (DRAIN) IMPLANT
DRAPE C-ARM 42X72 X-RAY (DRAPES) ×2 IMPLANT
DRAPE HALF SHEET 40X57 (DRAPES) IMPLANT
DRAPE LAPAROTOMY 100X72 PEDS (DRAPES) ×1 IMPLANT
DRAPE MICROSCOPE SLANT 54X150 (MISCELLANEOUS) ×1 IMPLANT
DRSG OPSITE 4X5.5 SM (GAUZE/BANDAGES/DRESSINGS) ×2 IMPLANT
DRSG OPSITE POSTOP 3X4 (GAUZE/BANDAGES/DRESSINGS) ×2 IMPLANT
DURAPREP 6ML APPLICATOR 50/CS (WOUND CARE) ×1 IMPLANT
ELECT COATED BLADE 2.86 ST (ELECTRODE) ×1 IMPLANT
ELECT REM PT RETURN 9FT ADLT (ELECTROSURGICAL) ×1
ELECTRODE REM PT RTRN 9FT ADLT (ELECTROSURGICAL) ×1 IMPLANT
ENDOSKELETON IMPLANT 16X14X7X6 (Cage) ×1 IMPLANT
ENDOSKELETON TC NANOLOCK 6MM (Cage) ×1 IMPLANT
EVACUATOR SILICONE 100CC (DRAIN) IMPLANT
GAUZE 4X4 16PLY ~~LOC~~+RFID DBL (SPONGE) IMPLANT
GLOVE BIOGEL PI IND STRL 7.5 (GLOVE) ×1 IMPLANT
GLOVE ECLIPSE 7.0 STRL STRAW (GLOVE) ×2 IMPLANT
GLOVE EXAM NITRILE XL STR (GLOVE) IMPLANT
GOWN STRL REUS W/ TWL LRG LVL3 (GOWN DISPOSABLE) ×2 IMPLANT
GOWN STRL REUS W/ TWL XL LVL3 (GOWN DISPOSABLE) IMPLANT
GOWN STRL REUS W/TWL 2XL LVL3 (GOWN DISPOSABLE) IMPLANT
GOWN STRL REUS W/TWL LRG LVL3 (GOWN DISPOSABLE) ×2
GOWN STRL REUS W/TWL XL LVL3 (GOWN DISPOSABLE)
HEMOSTAT POWDER KIT SURGIFOAM (HEMOSTASIS) ×1 IMPLANT
KIT BASIN OR (CUSTOM PROCEDURE TRAY) ×1 IMPLANT
KIT TURNOVER KIT B (KITS) ×1 IMPLANT
NDL SPNL 22GX3.5 QUINCKE BK (NEEDLE) ×1 IMPLANT
NEEDLE HYPO 22GX1.5 SAFETY (NEEDLE) ×1 IMPLANT
NEEDLE SPNL 22GX3.5 QUINCKE BK (NEEDLE) ×1 IMPLANT
NS IRRIG 1000ML POUR BTL (IV SOLUTION) ×1 IMPLANT
PACK LAMINECTOMY NEURO (CUSTOM PROCEDURE TRAY) ×1 IMPLANT
PAD ARMBOARD 7.5X6 YLW CONV (MISCELLANEOUS) ×3 IMPLANT
PLATE ZEVO 1LVL 17MM (Plate) IMPLANT
PLATE ZEVO 1LVL 19MM (Plate) IMPLANT
PUTTY DBF 1CC CORTICAL FIBERS (Putty) IMPLANT
SCREW 3.5 SELFDRILL 15MM VARI (Screw) IMPLANT
SOL ELECTROSURG ANTI STICK (MISCELLANEOUS) ×1
SOLUTION ELECTROSURG ANTI STCK (MISCELLANEOUS) ×1 IMPLANT
SPIKE FLUID TRANSFER (MISCELLANEOUS) ×2 IMPLANT
SPONGE INTESTINAL PEANUT (DISPOSABLE) ×1 IMPLANT
SPONGE SURGIFOAM ABS GEL 100 (HEMOSTASIS) ×1 IMPLANT
STRIP CLOSURE SKIN 1/2X4 (GAUZE/BANDAGES/DRESSINGS) IMPLANT
SUT ETHILON 3 0 FSL (SUTURE) IMPLANT
SUT VIC AB 3-0 SH 8-18 (SUTURE) ×1 IMPLANT
SUT VICRYL 3-0 RB1 18 ABS (SUTURE) ×1 IMPLANT
TAPE CLOTH 3X10 TAN LF (GAUZE/BANDAGES/DRESSINGS) ×1 IMPLANT
TOWEL GREEN STERILE (TOWEL DISPOSABLE) ×1 IMPLANT
TOWEL GREEN STERILE FF (TOWEL DISPOSABLE) ×1 IMPLANT
WATER STERILE IRR 1000ML POUR (IV SOLUTION) ×1 IMPLANT

## 2022-07-12 NOTE — H&P (Signed)
Chief Complaint   No chief complaint on file.   History of Present Illness  [name] is a 82 year old man I am seeing for initial consultation at the request of Dr. Jean Rosenthal. He is referred for evaluation of lower extremity weakness. Patient has a history of primarily left-sided back and buttock pain with previous lumbar spine MRI revealing lateral recess stenosis on the left at L2-3. Patient has noted near complete resolution of his symptoms with a series of epidural steroid injections directed via interlaminar approach on the left side at L2-3. The most recent injection was about three weeks ago. Interestingly, the patient notes worsening in leg weakness over the last 2-3 weeks. While he was previously able to walk around his living room without the assistance of any walker, the last few weeks he notes that his legs feel very weak and he is unable to do this. In addition, over the last six months or so, he has also noted that he feels like his arms and especially his hands are also weaker. He has a hard time making a fist on both sides. He has noted some slow but progressive instability in his walking. I therefore ordered MRI C spine which revealed significant spinal cord compression. He therefore presents for surgical decompression.  Of note, the patient does report a history of medically controlled hypertension and diabetes. He has associated neuropathy. He has a history of stroke and a seizure. No previous heart attacks. No known lung, liver, or kidney disease. No cancer history. He is on a baby aspirin. He quit smoking decades ago.   Past Medical History   Past Medical History:  Diagnosis Date   Ankylosing spondylitis    back, hips - on embrel 2000-2008, resumed 12/2012   CVA (cerebral vascular accident) 1999, 2002   no residual problems   Diabetes mellitus, type 2    Dyslipidemia    Hypertension    Neuropathy    Bilateral Hands/Feet   Osteoarthritis    Seizure disorder 05/23/2009    Sleep apnea    uses c-pap    Past Surgical History   Past Surgical History:  Procedure Laterality Date   CATARACT EXTRACTION W/ INTRAOCULAR LENS IMPLANT Bilateral 2022   COLONOSCOPY  2015   INGUINAL HERNIA REPAIR Left 1947    Social History   Social History   Tobacco Use   Smoking status: Former    Types: Cigarettes    Quit date: 1994    Years since quitting: 30.3   Smokeless tobacco: Never   Tobacco comments:    30 yrs ago  Vaping Use   Vaping Use: Never used  Substance Use Topics   Alcohol use: Not Currently   Drug use: No    Medications   Prior to Admission medications   Medication Sig Start Date End Date Taking? Authorizing Provider  Alirocumab 75 MG/ML SOAJ Inject 75 mg into the skin every 14 (fourteen) days. 10/22/21  Yes Nche, Bonna Gains, NP  amLODipine (NORVASC) 10 MG tablet Take 1 tablet (10 mg total) by mouth daily. 10/16/17  Yes Nche, Bonna Gains, NP  aspirin EC 81 MG tablet Take 1 tablet (81 mg total) by mouth daily. Swallow whole. 07/18/20  Yes Micki Riley, MD  benazepril (LOTENSIN) 10 MG tablet Take 10 mg by mouth daily.   Yes [provider]  Calcium-Cholecalciferol (QC CALCIUM -D3) 500-5 MG-MCG TABS Take 1 tablet by mouth in the morning and at bedtime. 01/10/22  Yes [provider]  Cholecalciferol (  VITAMIN D3) 125 MCG (5000 UT) CAPS Take 1 capsule (5,000 Units total) by mouth daily. 10/22/21  Yes Nche, Bonna Gains, NP  empagliflozin (JARDIANCE) 25 MG TABS tablet Take 1 tablet (25 mg total) by mouth daily. 10/22/21  Yes Nche, Bonna Gains, NP  etanercept (ENBREL) 50 MG/ML injection Inject 50 mg into the skin once a week.   Yes [provider]  fish oil-omega-3 fatty acids 1000 MG capsule Take 2 g by mouth daily.   Yes [provider]  Magnesium Oxide 420 MG TABS Take 420 mg by mouth daily after breakfast. 10/16/17  Yes Nche, Bonna Gains, NP  metFORMIN (GLUCOPHAGE) 1000 MG tablet Take 1,000 mg by mouth 2  (two) times daily with a meal.   Yes [provider]  metoprolol tartrate (LOPRESSOR) 25 MG tablet Take 25 mg by mouth 2 (two) times daily.   Yes [provider]  Multiple Vitamin (MULTIVITAMIN) tablet Take 1 tablet by mouth daily.   Yes [provider]  omeprazole (PRILOSEC) 20 MG capsule Take 20 mg by mouth daily.   Yes [provider]  pregabalin (LYRICA) 75 MG capsule Take 1 capsule (75 mg total) by mouth 2 (two) times daily. 06/03/22  Yes Nche, Bonna Gains, NP  RESTASIS 0.05 % ophthalmic emulsion Place 1 drop into both eyes 2 (two) times daily. 05/30/21  Yes [provider]  Semaglutide,0.25 or 0.5MG /DOS, 2 MG/3ML SOPN Inject 0.5 mg into the skin every 7 (seven) days. 04/11/22  Yes [provider]    Allergies   Allergies  Allergen Reactions   Simvastatin Other (See Comments)    Review of Systems  ROS  Neurologic Exam  Awake, alert, oriented Memory and concentration grossly intact Speech fluent, appropriate CN grossly intact Motor exam: Upper Extremities Deltoid Bicep Tricep Grip  Right 5/5 5/5 5/5 5/5  Left 5/5 5/5 5/5 5/5   Lower Extremities IP Quad PF DF EHL  Right 5/5 5/5 5/5 5/5 5/5  Left 5/5 5/5 5/5 5/5 5/5   Sensation grossly intact to LT  Imaging  MRI reveals severe stenosis at C5-6 and to a lesser extent at C6-7  Impression  - 82 y.o. male with signs/symptoms of cervical myelopathy and MRI revealing severe stenosis with spinal cord compression  Plan  - Will proceed with ACDF C5-6 C6-7  I have reviewed the indications for the procedure as well as the details of the procedure and the expected postoperative course and recovery at length with the patient in the office. We have also reviewed in detail the risks, benefits, and alternatives to the procedure. All questions were answered and DERIN GRANQUIST provided informed consent to proceed.  Lisbeth Renshaw, MD [facility]

## 2022-07-12 NOTE — Transfer of Care (Signed)
Immediate Anesthesia Transfer of Care Note  Patient: Andrew Jones  Procedure(s) Performed: Anterior Cervical Decompression/Discectomy Fusion Cervical Five-Six, Cervical Six-Seven  Patient Location: PACU  Anesthesia Type:General  Level of Consciousness: awake, oriented, and confused  Airway & Oxygen Therapy: Patient Spontanous Breathing and Patient connected to nasal cannula oxygen  Post-op Assessment: Report given to RN and Post -op Vital signs reviewed and stable  Post vital signs: Reviewed and stable  Last Vitals:  Vitals Value Taken Time  BP 129/63 07/12/22 1845  Temp    Pulse 102 07/12/22 1848  Resp 8 07/12/22 1848  SpO2 93 % 07/12/22 1848  Vitals shown include unvalidated device data.  Last Pain:  Vitals:   07/12/22 1148  PainSc: 0-No pain      Patients Stated Pain Goal: 0 (07/12/22 1148)  Complications:  Encounter Notable Events  Notable Event Outcome Phase Comment  Difficult to intubate - expected  Intraprocedure Filed from anesthesia note documentation.

## 2022-07-12 NOTE — Anesthesia Postprocedure Evaluation (Signed)
Anesthesia Post Note  Patient: SHRIYANS KUENZI  Procedure(s) Performed: Anterior Cervical Decompression/Discectomy Fusion Cervical Five-Six, Cervical Six-Seven     Patient location during evaluation: PACU Anesthesia Type: General Level of consciousness: awake and alert Pain management: pain level controlled Vital Signs Assessment: post-procedure vital signs reviewed and stable Respiratory status: spontaneous breathing, nonlabored ventilation, respiratory function stable and patient connected to nasal cannula oxygen Cardiovascular status: blood pressure returned to baseline and stable Postop Assessment: no apparent nausea or vomiting Anesthetic complications: yes  Encounter Notable Events  Notable Event Outcome Phase Comment  Difficult to intubate - expected  Intraprocedure Filed from anesthesia note documentation.    Last Vitals:  Vitals:   07/12/22 1930 07/12/22 2010  BP: (!) 144/69 (!) 154/75  Pulse: 97 100  Resp: (!) 8 18  Temp: 37.1 C 36.6 C  SpO2: 94% 97%    Last Pain:  Vitals:   07/12/22 2010  TempSrc: Oral  PainSc:       LLE Sensation: Full sensation (07/12/22 2010)   RLE Sensation: Full sensation (07/12/22 2010)      Kennieth Rad

## 2022-07-12 NOTE — Anesthesia Procedure Notes (Signed)
Procedure Name: Intubation Date/Time: 07/12/2022 3:38 PM  Performed by: Adria Dill, CRNAPre-anesthesia Checklist: Patient identified, Emergency Drugs available, Suction available and Patient being monitored Patient Re-evaluated:Patient Re-evaluated prior to induction Oxygen Delivery Method: Circle system utilized Preoxygenation: Pre-oxygenation with 100% oxygen Induction Type: IV induction Ventilation: Oral airway inserted - appropriate to patient size and Mask ventilation without difficulty Laryngoscope Size: Glidescope and 4 Grade View: Grade I Tube type: Oral Tube size: 7.5 mm Number of attempts: 1 Airway Equipment and Method: Stylet and Oral airway Placement Confirmation: ETT inserted through vocal cords under direct vision, positive ETCO2 and breath sounds checked- equal and bilateral Secured at: 21 cm Tube secured with: Tape Dental Injury: Teeth and Oropharynx as per pre-operative assessment  Difficulty Due To: Difficulty was anticipated and Difficult Airway- due to reduced neck mobility Future Recommendations: Recommend- induction with short-acting agent, and alternative techniques readily available

## 2022-07-12 NOTE — Op Note (Signed)
NEUROSURGERY OPERATIVE NOTE   PREOP DIAGNOSIS: Cervical Stenosis with Myelopathy, C5-6, C6-7  POSTOP DIAGNOSIS: Same  PROCEDURE: 1. Discectomy at C5-6, C6-7 for decompression of spinal cord and exiting nerve roots  2. Placement of intervertebral biomechanical device, Medtronic Titan cages 3. Placement of anterior instrumentation consisting of interbody plate and screws - Medtronic Zevo 19mm plate, 15mm screws at C5-6, 17mm plate 15mm screws at C6-7 4. Use of morselized bone allograft  5. Arthrodesis C5-6, C6-7, anterior interbody technique  6. Use of intraoperative microscope  SURGEON: Dr. Lisbeth Renshaw, MD  ASSISTANT: Dr. Julio Sicks MD  ANESTHESIA: General Endotracheal  EBL: Minimal  SPECIMENS: None  DRAINS: None  COMPLICATIONS: None immediate  CONDITION: Hemodynamically stable to PACU  HISTORY: Andrew Jones is a 82 y.o. initially seen in the outpatient neurosurgery clinic complaining of gait instability, lower extremity weakness, and associated upper extremity weakness.  MRI of the cervical spine did reveal severe stenosis at C5-6 with spinal cord compression and signal change, and moderate to severe stenosis at C6-7.  Surgical decompression and fusion was therefore indicated.  The risks, benefits, and alternatives to surgery were all reviewed in detail with the patient and his daughter-in-law.  After all questions were answered informed consent was obtained and witnessed.  PROCEDURE IN DETAIL: The patient was brought to the operating room and transferred to the operative table. After induction of general anesthesia, the patient was positioned on the operative table in the supine position with all pressure points meticulously padded. The skin of the neck was then prepped and draped in the usual sterile fashion.  After timeout was conducted, the skin was infiltrated with local anesthetic. Skin incision was then made sharply and Bovie electrocautery was used to dissect the  subcutaneous tissue until the platysma was identified. The platysma was then divided and undermined. The sternocleidomastoid muscle was then identified and, utilizing natural fascial planes in the neck, the prevertebral fascia was identified and the carotid sheath was retracted laterally and the trachea and esophagus retracted medially. Again using fluoroscopy, the correct disc spaces were identified. Bovie electrocautery was used to dissect in the subperiosteal plane and elevate the bilateral longus coli muscles. Table mounted retractors were then placed. At this point, the microscope was draped and brought into the field, and the remainder of the case was done under the microscope using microdissecting technique.  Of note, there were extremely large osteophytes emanating from the anterior aspect of the C5, C6, and C7 vertebral bodies.  Rongeur's and the high-speed drill were used to remove these osteophytes down to the normal level of the anterior cortex.  The C5-6 disc space was incised sharply and rongeurs were use to initially complete a discectomy. The high-speed drill was then used to complete discectomy until the posterior annulus was identified and removed and the posterior longitudinal ligament was identified. Using a nerve hook, the PLL was elevated, and Kerrison rongeurs were used to remove the posterior longitudinal ligament and the ventral thecal sac was identified. Using a combination of curettes and rongeurs, complete decompression of the thecal sac and exiting nerve roots at this level was completed.  At this point, a 7 mm medium with lordotic interbody cage was sized and packed with morcellized bone allograft.  Endplates were prepared with curettes and the high-speed drill.  The cage was then inserted and tapped into place flush with the anterior vertebral body.  A 19 mm plate was then selected and placed across the interspace and secured with 15 mm  screws in C5 and C6.  Attention was then  turned to the C6-7 level. In a similar fashion, discectomy was completed initially with curettes and rongeurs, and completed with the drill. The PLL was again identified, elevated and incised. Using Kerrison rongeurs, decompression of the spinal cord and exiting roots at this level was completed and confirmed with a dissector.  A 6 mm lordotic interbody cage was then sized and filled with bone allograft, and tapped into place after preparation of the endplates with curettes.  A 17 mm plate was then placed across the interspace and secured with 15 mm screws in C6 and C7.  Final fluoroscopic images in lateral projection were taken to confirm good hardware placement.  At this point, after all counts were verified to be correct, meticulous hemostasis was secured using a combination of bipolar electrocautery and passive hemostatics. The platysma muscle was then closed using interrupted 3-0 Vicryl sutures, and the skin was closed with a interrupted subcuticular stitch. Sterile dressings were then applied and the drapes removed.  The patient tolerated the procedure well and was extubated in the room and taken to the postanesthesia care unit in stable condition.   Lisbeth Renshaw, MD [facility]

## 2022-07-13 DIAGNOSIS — I10 Essential (primary) hypertension: Secondary | ICD-10-CM | POA: Diagnosis not present

## 2022-07-13 DIAGNOSIS — Z79899 Other long term (current) drug therapy: Secondary | ICD-10-CM | POA: Diagnosis not present

## 2022-07-13 DIAGNOSIS — Z7982 Long term (current) use of aspirin: Secondary | ICD-10-CM | POA: Diagnosis not present

## 2022-07-13 DIAGNOSIS — Z7984 Long term (current) use of oral hypoglycemic drugs: Secondary | ICD-10-CM | POA: Diagnosis not present

## 2022-07-13 DIAGNOSIS — M4802 Spinal stenosis, cervical region: Secondary | ICD-10-CM | POA: Diagnosis not present

## 2022-07-13 DIAGNOSIS — Z87891 Personal history of nicotine dependence: Secondary | ICD-10-CM | POA: Diagnosis not present

## 2022-07-13 DIAGNOSIS — Z8673 Personal history of transient ischemic attack (TIA), and cerebral infarction without residual deficits: Secondary | ICD-10-CM | POA: Diagnosis not present

## 2022-07-13 DIAGNOSIS — E1140 Type 2 diabetes mellitus with diabetic neuropathy, unspecified: Secondary | ICD-10-CM | POA: Diagnosis not present

## 2022-07-13 LAB — GLUCOSE, CAPILLARY: Glucose-Capillary: 215 mg/dL — ABNORMAL HIGH (ref 70–99)

## 2022-07-13 MED ORDER — INSULIN ASPART 100 UNIT/ML IJ SOLN: 0.0000 [IU] | Freq: Three times a day (TID) | INTRAMUSCULAR | Status: DC

## 2022-07-13 MED ORDER — ASPIRIN EC 81 MG PO TBEC: 81.0000 mg | DELAYED_RELEASE_TABLET | Freq: Every day | ORAL | 11 refills | Status: AC

## 2022-07-13 MED ORDER — OXYCODONE HCL 10 MG PO TABS: 10.0000 mg | ORAL_TABLET | Freq: Four times a day (QID) | ORAL | 0 refills | Status: AC | PRN

## 2022-07-13 MED ORDER — METHOCARBAMOL 500 MG PO TABS: 500.0000 mg | ORAL_TABLET | Freq: Three times a day (TID) | ORAL | 0 refills | Status: DC | PRN

## 2022-07-13 NOTE — Evaluation (Signed)
Occupational Therapy Evaluation/Discharge Patient Details Name: Andrew Jones MRN: [id number] DOB: [birth year] Today's Date: 07/13/2022   History of Present Illness Pt is a 81 y/o male admitted for ACDF C5-6, C6-7 in setting of cervical myelopathy, stenosis and cord compression. PMH: CVA, HTN, OA, seizure.   Clinical Impression   PTA, pt lives with wife in ILF cottage at [employer]. Pt reports typically Modified Independent with ADLs, IADLs, driving and mobility with recent use of Rollator due to LE weakness. 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 Pt presents now with reported improving B hand sensation. Educated re: cervical precautions including body mechanics during daily tasks, compensatory strategies for ADLs, task modification for IADLs and fall prevention strategies. Pt able to manage ADLs/mobility with Rollator with Modified Independence. Will defer further follow-up therapies to neurosurgeon after pt's follow up visit in office. No immediate therapy needs at acute level or on DC.     Recommendations for follow up therapy are one component of a multi-disciplinary discharge planning process, led by the attending physician.  Recommendations may be updated based on patient status, additional functional criteria and insurance authorization.   Assistance Recommended at Discharge PRN  Patient can return home with the following Assist for transportation    Functional Status Assessment  Patient has had a recent decline in their functional status and demonstrates the ability to make significant improvements in function in a reasonable and predictable amount of time.  Equipment Recommendations  None recommended by OT    Recommendations for Other Services       Precautions / Restrictions Precautions Precautions: Fall;Cervical Precaution Booklet Issued: Yes (comment) Precaution Comments: no brace needed Restrictions Weight Bearing Restrictions: No      Mobility Bed  Mobility               General bed mobility comments: EOB on entry    Transfers Overall transfer level: Modified independent Equipment used: Rollator (4 wheels), None                      Balance Overall balance assessment: Needs assistance Sitting-balance support: No upper extremity supported, Feet supported Sitting balance-Leahy Scale: Good     Standing balance support: Bilateral upper extremity supported, During functional activity, No upper extremity supported Standing balance-Leahy Scale: Fair Standing balance comment: uses Rollator for mobility                           ADL either performed or assessed with clinical judgement   ADL Overall ADL's : Modified independent                                       General ADL Comments: Able to dress self including shirt buttons w/ increased time, mobilize in hallway with Rollator without LOB or assist. Educated to don weaker LE first w/ dressing, use shower chair initially and avoid a lot of looking up/down/side to side.     Vision Baseline Vision/History: 1 Wears glasses Ability to See in Adequate Light: 0 Adequate Patient Visual Report: No change from baseline Vision Assessment?: No apparent visual deficits     Perception     Praxis      Pertinent Vitals/Pain Pain Assessment Pain Assessment: No/denies pain     Hand Dominance Right   Extremity/Trunk Assessment Upper Extremity Assessment Upper Extremity Assessment: RUE deficits/detail;LUE deficits/detail  RUE Deficits / Details: some decreased sensation but improving since sx. able to functionally hold and manipulate items   Lower Extremity Assessment Lower Extremity Assessment: Generalized weakness (R LE > L LE)   Cervical / Trunk Assessment Cervical / Trunk Assessment: Neck Surgery   Communication Communication Communication: No difficulties   Cognition Arousal/Alertness: Awake/alert Behavior During Therapy: WFL for  tasks assessed/performed Overall Cognitive Status: Within Functional Limits for tasks assessed                                       General Comments       Exercises     Shoulder Instructions      Home Living Family/patient expects to be discharged to:: Private residence Living Arrangements: Spouse/significant other (wife has mild dementia) Available Help at Discharge: Family;Available 24 hours/day Type of Home: House Home Access: Level entry     Home Layout: One level     Bathroom Shower/Tub: Producer, television/film/video: Handicapped height Bathroom Accessibility: Yes   Home Equipment: Rollator (4 wheels);Cane - single point;Shower seat - built in;Shower seat;Grab bars - tub/shower;Grab bars - toilet;Hand held shower head          Prior Functioning/Environment Prior Level of Function : Independent/Modified Independent;Driving;Working/employed             Mobility Comments: recently using Rollator due to LE weakness from cord compression. 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 Makes simple meals and picks up dinner from [employer] for he and his wife        OT Problem List: Decreased strength;Impaired balance (sitting and/or standing);Decreased coordination;Impaired sensation      OT Treatment/Interventions:      OT Goals(Current goals can be found in the care plan section) Acute Rehab OT Goals Patient Stated Goal: home today OT Goal Formulation: All assessment and education complete, DC therapy  OT Frequency:      Co-evaluation              AM-PAC OT "6 Clicks" Daily Activity     Outcome Measure Help from another person eating meals?: None Help from another person taking care of personal grooming?: None Help from another person toileting, which includes using toliet, bedpan, or urinal?: None Help from another person bathing (including washing,  rinsing, drying)?: None Help from another person to put on and taking off regular upper body clothing?: None Help from another person to put on and taking off regular lower body clothing?: None 6 Click Score: 24   End of Session Equipment Utilized During Treatment: Rollator (4 wheels) Nurse Communication: Mobility status  Activity Tolerance: Patient tolerated treatment well Patient left: in bed;with call bell/phone within reach  OT Visit Diagnosis: Other abnormalities of gait and mobility (R26.89);Muscle weakness (generalized) (M62.81)                Time: 1610-9604 OT Time Calculation (min): 21 min Charges:  OT General Charges $OT Visit: 1 Visit OT Evaluation $OT Eval Low Complexity: 1 Low  Bradd Canary, OTR/L Acute Rehab Services Office: [phone]   Lorre Munroe 07/13/2022, 8:30 AM

## 2022-07-13 NOTE — Progress Notes (Signed)
PT Cancellation Note  Patient Details Name: Andrew Jones MRN: [id number] DOB: 1940/11/09   Cancelled Treatment:    Reason Eval/Treat Not Completed: PT screened, no needs identified, will sign off (evaluated by OT with no PT needs identified).  Lillia Pauls, PT, DPT Acute Rehabilitation Services Office [phone]    Norval Morton 07/13/2022, 8:17 AM

## 2022-07-13 NOTE — Progress Notes (Signed)
RT set up CPAP HS at beside. Patient is able to place on when he is ready.

## 2022-07-13 NOTE — Discharge Summary (Signed)
Physician Discharge Summary  Patient ID: Andrew Jones MRN: [id number] DOB/AGE: [birth year] 82 y.o.  Admit date: 07/12/2022 Discharge date: 07/13/2022  Admission Diagnoses:  Cervical stenosis with myelopathy  Discharge Diagnoses:  Same Principal Problem:   Stenosis of cervical spine with myelopathy   Discharged Condition: Stable  Hospital Course:  Andrew Jones is a 82 y.o. male admitted after uncomplicated ACDF C5-6 C6-7. He was at baseline postop, with mild neck pain, ambulating at baseline with rollator walker. He was tolerating diet and requested d/c home.  Treatments: Surgery - ACDF C5-6 C6-7  Discharge Exam: Blood pressure (!) 117/55, pulse 99, temperature 97.9 F (36.6 C), temperature source Oral, resp. rate 20, height  (1.702 m), weight 81.2 kg, SpO2 98 %. Awake, alert, oriented Speech fluent, appropriate CN grossly intact 5/5 BUE/BLE Wound c/d/i  Disposition: Discharge disposition: 01-Home or Self Care       Discharge Instructions     Call MD for:  redness, tenderness, or signs of infection (pain, swelling, redness, odor or green/yellow discharge around incision site)   Complete by: As directed    Call MD for:  temperature >100.4   Complete by: As directed    Diet - low sodium heart healthy   Complete by: As directed    Discharge instructions   Complete by: As directed    Walk at home as much as possible, at least 4 times / day   Incentive spirometry RT   Complete by: As directed    Increase activity slowly   Complete by: As directed    Lifting restrictions   Complete by: As directed    No lifting > 10 lbs   May shower / Bathe   Complete by: As directed    48 hours after surgery   May walk up steps   Complete by: As directed    Other Restrictions   Complete by: As directed    No bending/twisting at waist   Remove dressing in 48 hours   Complete by: As directed       Allergies as of 07/13/2022       Reactions   Simvastatin Other  (See Comments)        Medication List     TAKE these medications    Alirocumab 75 MG/ML Soaj Inject 75 mg into the skin every 14 (fourteen) days.   amLODipine 10 MG tablet Commonly known as: NORVASC Take 1 tablet (10 mg total) by mouth daily.   aspirin EC 81 MG tablet Take 1 tablet (81 mg total) by mouth daily. Swallow whole. Start taking on: July 17, 2022 What changed: These instructions start on July 17, 2022. If you are unsure what to do until then, ask your doctor or other care provider.   benazepril 10 MG tablet Commonly known as: LOTENSIN Take 10 mg by mouth daily.   empagliflozin 25 MG Tabs tablet Commonly known as: JARDIANCE Take 1 tablet (25 mg total) by mouth daily.   etanercept 50 MG/ML injection Commonly known as: ENBREL Inject 50 mg into the skin once a week.   fish oil-omega-3 fatty acids 1000 MG capsule Take 2 g by mouth daily.   Magnesium Oxide 420 MG Tabs Take 420 mg by mouth daily after breakfast.   metFORMIN 1000 MG tablet Commonly known as: GLUCOPHAGE Take 1,000 mg by mouth 2 (two) times daily with a meal.   methocarbamol 500 MG tablet Commonly known as: ROBAXIN Take 1 tablet (500 mg total) by mouth every  8 (eight) hours as needed for muscle spasms.   metoprolol tartrate 25 MG tablet Commonly known as: LOPRESSOR Take 25 mg by mouth 2 (two) times daily.   multivitamin tablet Take 1 tablet by mouth daily.   omeprazole 20 MG capsule Commonly known as: PRILOSEC Take 20 mg by mouth daily.   Oxycodone HCl 10 MG Tabs Take 1 tablet (10 mg total) by mouth every 6 (six) hours as needed for up to 7 days for severe pain ((score 7 to 10)).   pregabalin 75 MG capsule Commonly known as: LYRICA Take 1 capsule (75 mg total) by mouth 2 (two) times daily.   QC Calcium -D3 500-5 MG-MCG Tabs Generic drug: Calcium-Cholecalciferol Take 1 tablet by mouth in the morning and at bedtime.   Restasis 0.05 % ophthalmic emulsion Generic drug:  cycloSPORINE Place 1 drop into both eyes 2 (two) times daily.   Semaglutide(0.25 or 0.5MG /DOS) 2 MG/3ML Sopn Inject 0.5 mg into the skin every 7 (seven) days.   Vitamin D3 125 MCG (5000 UT) Caps Take 1 capsule (5,000 Units total) by mouth daily.        Follow-up Information     Lisbeth Renshaw, MD Follow up.   Specialty: Neurosurgery Contact information: [address] [place] [address] [phone]                 Signed: Jackelyn Hoehn 07/13/2022, 8:47 AM

## 2022-07-13 NOTE — Plan of Care (Signed)
  Problem: Education: Goal: Ability to verbalize activity precautions or restrictions will improve Outcome: Completed/Met Goal: Knowledge of the prescribed therapeutic regimen will improve Outcome: Completed/Met Goal: Understanding of discharge needs will improve Outcome: Completed/Met   Problem: Activity: Goal: Ability to avoid complications of mobility impairment will improve Outcome: Completed/Met Goal: Ability to tolerate increased activity will improve Outcome: Completed/Met Goal: Will remain free from falls Outcome: Completed/Met   Problem: Pain Management: Goal: Pain level will decrease Outcome: Completed/Met   Problem: Skin Integrity: Goal: Will show signs of wound healing Outcome: Completed/Met   Problem: Health Behavior/Discharge Planning: Goal: Identification of resources available to assist in meeting health care needs will improve Outcome: Completed/Met   Problem: Bladder/Genitourinary: Goal: Urinary functional status for postoperative course will improve Outcome: Completed/Met  Patient alert and oriented, void, ambulate, surgical site is clean and dry. D/c Instructions explain and given.

## 2022-07-16 ENCOUNTER — Encounter (HOSPITAL_COMMUNITY): Payer: Self-pay | Admitting: Neurosurgery

## 2022-07-16 ENCOUNTER — Telehealth: Payer: Self-pay

## 2022-07-16 NOTE — Transitions of Care (Post Inpatient/ED Visit) (Signed)
   07/16/2022  Name: Andrew Jones MRN: [id number] DOB: 06/21/1940  Today's TOC FU Call Status: Today's TOC FU Call Status:: Successful TOC FU Call Competed TOC FU Call Complete Date: 07/16/22  Transition Care Management Follow-up Telephone Call Date of Discharge: 07/13/22 Discharge Facility[facility]) Type of Discharge: Inpatient Admission Primary Inpatient Discharge Diagnosis:: Spinal stenosis How have you been since you were released from the hospital?: Better Any questions or concerns?: No  Items Reviewed: Did you receive and understand the discharge instructions provided?: Yes Medications obtained and verified?: Yes (Medications Reviewed) Any new allergies since your discharge?: No Dietary orders reviewed?: NA Do you have support at home?: Yes People in Home: child(ren), adult  Home Care and Equipment/Supplies: Were Home Health Services Ordered?: No Any new equipment or medical supplies ordered?: No  Functional Questionnaire: Do you need assistance with bathing/showering or dressing?: No Do you need assistance with meal preparation?: No Do you need assistance with eating?: No Do you have difficulty maintaining continence: No Do you need assistance with getting out of bed/getting out of a chair/moving?: No Do you have difficulty managing or taking your medications?: No  Follow up appointments reviewed: PCP Follow-up appointment confirmed?: NA Specialist Hospital Follow-up appointment confirmed?: Yes Date of Specialist follow-up appointment?: 08/05/22 Follow-Up Specialty Provider:: Dr. Conchita Paris Do you need transportation to your follow-up appointment?: No Do you understand care options if your condition(s) worsen?: Yes-patient verbalized understanding    SIGNATURE Kandis Fantasia, LPN [facility] Health Advisor Eleele l [facility] You Are. We Are. One [employer] # [phone]

## 2022-07-30 ENCOUNTER — Other Ambulatory Visit: Payer: Self-pay | Admitting: Nurse Practitioner

## 2022-07-30 DIAGNOSIS — E559 Vitamin D deficiency, unspecified: Secondary | ICD-10-CM

## 2022-08-01 ENCOUNTER — Other Ambulatory Visit: Payer: Self-pay | Admitting: Oncology

## 2022-08-01 DIAGNOSIS — D500 Iron deficiency anemia secondary to blood loss (chronic): Secondary | ICD-10-CM

## 2022-08-02 ENCOUNTER — Other Ambulatory Visit: Payer: Self-pay

## 2022-08-02 ENCOUNTER — Inpatient Hospital Stay: Payer: Medicare Other | Attending: Oncology

## 2022-08-02 ENCOUNTER — Inpatient Hospital Stay (HOSPITAL_BASED_OUTPATIENT_CLINIC_OR_DEPARTMENT_OTHER): Payer: Medicare Other | Admitting: Oncology

## 2022-08-02 VITALS — BP 131/62 | HR 62 | Temp 97.7°F | Resp 16 | Wt 173.9 lb

## 2022-08-02 DIAGNOSIS — D500 Iron deficiency anemia secondary to blood loss (chronic): Secondary | ICD-10-CM

## 2022-08-02 DIAGNOSIS — Z8673 Personal history of transient ischemic attack (TIA), and cerebral infarction without residual deficits: Secondary | ICD-10-CM | POA: Insufficient documentation

## 2022-08-02 DIAGNOSIS — Z8042 Family history of malignant neoplasm of prostate: Secondary | ICD-10-CM | POA: Diagnosis not present

## 2022-08-02 DIAGNOSIS — E1159 Type 2 diabetes mellitus with other circulatory complications: Secondary | ICD-10-CM | POA: Insufficient documentation

## 2022-08-02 DIAGNOSIS — Z79899 Other long term (current) drug therapy: Secondary | ICD-10-CM | POA: Diagnosis not present

## 2022-08-02 DIAGNOSIS — D649 Anemia, unspecified: Secondary | ICD-10-CM | POA: Diagnosis not present

## 2022-08-02 DIAGNOSIS — Z87891 Personal history of nicotine dependence: Secondary | ICD-10-CM | POA: Insufficient documentation

## 2022-08-02 DIAGNOSIS — D539 Nutritional anemia, unspecified: Secondary | ICD-10-CM | POA: Diagnosis not present

## 2022-08-02 DIAGNOSIS — Z7982 Long term (current) use of aspirin: Secondary | ICD-10-CM

## 2022-08-02 DIAGNOSIS — I10 Essential (primary) hypertension: Secondary | ICD-10-CM | POA: Diagnosis not present

## 2022-08-02 LAB — FERRITIN: Ferritin: 136 ng/mL (ref 24–336)

## 2022-08-02 NOTE — Progress Notes (Signed)
Andrew Jones:  Patient Care Team: Nche, Bonna Gains, NP as PCP - General (Internal Medicine) Micki Riley, MD (Neurology) [place], Heather, OD (Optometry) Hilarie Fredrickson, MD (Gastroenterology)  CHIEF COMPLAINTS/PURPOSE OF CONSULTATION:  HISTORY OF PRESENTING ILLNESS: Andrew Jones 82 y.o. male is here because of anemia Medical history notable for ankylosing spondylitis, CVA, diabetes mellitus type 2, hyperlipidemia, hypertension, seizure disorder, sleep apnea on CPAP  January 10, 2022: WBC 10.4 hemoglobin 12.3 platelet count 280 CMP notable for sodium 136 potassium 5.6 ALT 48  April 23, 2022 and May 08, 2022 fecal occult blood testing negative  May 31 2022:  [facility] Hematology Consult  Patient states that he has been told that he is intermittently anemic and was recently placed on oral iron which he doesn't tolerate well due to constipation.  Has never received IV iron nor required PRBC's in the past. Has a normal diet.  No surgical history.  No hematochezia, melena, hemoptysis, hematuria.  No  history of intra-articular or soft tissue bleeding.  Takes Tylenol for pain.  Does not use NSAIDS for pain.  Is not taking oral anticoagulants  Takes ASA 81 mg at night.  No history of abnormal bleeding in family members.  Patient has symptoms of fatigue, pallor,  DOE, decreased performance status.  No pica to ice/starch/dirt. Underwent a colonoscopy about 8 yrs ago which was negative per patient.  No history of EGD.    Social:  Married.  Owns a used car lot.  Tobacco none.  EtOH used to drink one a day but quit.  No Agent Orange Exposure to his knowledge.   WBC 11.1 hemoglobin 12.0 MCV 82 platelet count 335; 47 segs 39 lymphs 10 monos 3 eos 1 basophil Coombs test negative haptoglobin 123 Hemoglobin electrophoresis showed normal adult pattern serum free kappa 30.5 lambda 22.2 with a kappa lambda 1.37 SPEP with IEP showed no paraprotein IgG 782 IgA  97 IgM 51 Ferritin 10 folate 31.1 Copper 62 B12 443 zinc 63 CMP notable for glucose of 116 creatinine 0.83  June 11, 2022 Feraheme 510 mg June 18 2022: Feraheme 510 mg  July 05 2022: WBC 11.6 hemoglobin 13.8 MCV 86 platelet count 319  July 12, 2022: Underwent discectomy C5-6, C6-7 for decompression of spinal cord and existing nerves.  Placement of intervertebral biomechanical device  Aug 02, 2022: Scheduled follow-up for management of anemia.  Feels better as a result of the surgery.  Numbness and weakness in hands, gait have all improved.  To start PT after Jones next week.   Still ambulating with a walker.  To see GI in June 2024.   Will begin low dose iron.   Reviewed results of labs.  Ferritin 136.  Copper 76  Review of Systems - Oncology  MEDICAL HISTORY: Past Medical History:  Diagnosis Date   Ankylosing spondylitis (HCC)    back, hips - on embrel 2000-2008, resumed 12/2012   CVA (cerebral vascular accident) (HCC) 1999, 2002   no residual problems   Diabetes mellitus, type 2 (HCC)    Dyslipidemia    Hypertension    Neuropathy    Bilateral Hands/Feet   Osteoarthritis    Seizure disorder (HCC) 05/23/2009   Sleep apnea    uses c-pap    SURGICAL HISTORY: Past Surgical History:  Procedure Laterality Date   ANTERIOR CERVICAL DECOMP/DISCECTOMY FUSION N/A 07/12/2022   Procedure: Anterior Cervical Decompression/Discectomy Fusion Cervical Five-Six, Cervical Six-Seven;  Surgeon: Lisbeth Renshaw, MD;  Location: [facility] OR;  Service: Neurosurgery;  Laterality: N/A;  3C   CATARACT EXTRACTION W/ INTRAOCULAR LENS IMPLANT Bilateral 2022   COLONOSCOPY  2015   INGUINAL HERNIA REPAIR Left 1947    SOCIAL HISTORY: Social History   Socioeconomic History   Marital status: Married    Spouse name: Not on file   Number of children: 1   Years of education: 13   Highest education level: Not on file  Occupational History   Occupation: Retired  Tobacco Use   Smoking status: Former     Types: Cigarettes    Quit date: 1994    Years since quitting: 30.3   Smokeless tobacco: Never   Tobacco comments:    30 yrs ago  Psychologist, educational Use   Vaping Use: Never used  Substance and Sexual Activity   Alcohol use: Not Currently   Drug use: No   Sexual activity: Yes  Other Topics Concern   Not on file  Social History Narrative   Lives with wife   Right Handed   Drinks 2-3 cups caffeine daily   Social Determinants of Health   Financial Resource Strain: Low Risk  (03/29/2022)   Overall Financial Resource Strain (CARDIA)    Difficulty of Paying Living Expenses: Not hard at all  Food Insecurity: No Food Insecurity (03/29/2022)   Hunger Vital Sign    Worried About Running Out of Food in the Last Year: Never true    Ran Out of Food in the Last Year: Never true  Transportation Needs: No Transportation Needs (03/29/2022)   PRAPARE - Administrator, Civil Service (Medical): No    Lack of Transportation (Non-Medical): No  Physical Activity: Inactive (03/29/2022)   Exercise Vital Sign    Days of Exercise per Week: 0 days    Minutes of Exercise per Session: 0 min  Stress: No Stress Concern Present (03/29/2022)   [employer] of Occupational Health - Occupational Stress Questionnaire    Feeling of Stress : Not at all  Social Connections: Moderately Isolated (03/28/2021)   Social Connection and Isolation Panel [NHANES]    Frequency of Communication with Friends and Family: Twice a week    Frequency of Social Gatherings with Friends and Family: Twice a week    Attends Religious Services: Never    Database administrator or Organizations: No    Attends Banker Meetings: Never    Marital Status: Married  Catering manager Violence: Not At Risk (03/28/2021)   Humiliation, Afraid, Rape, and Kick questionnaire    Fear of Current or Ex-Partner: No    Emotionally Abused: No    Physically Abused: No    Sexually Abused: No    FAMILY HISTORY Family History  Problem Relation  Age of Onset   Heart failure Mother    Heart disease Mother    COPD Father    Prostate cancer Brother     ALLERGIES:  is allergic to simvastatin.  MEDICATIONS:  Current Outpatient Medications  Medication Sig Dispense Refill   Alirocumab 75 MG/ML SOAJ Inject 75 mg into the skin every 14 (fourteen) days. 2 mL    amLODipine (NORVASC) 10 MG tablet Take 1 tablet (10 mg total) by mouth daily.     aspirin EC 81 MG tablet Take 1 tablet (81 mg total) by mouth daily. Swallow whole. 30 tablet 11   benazepril (LOTENSIN) 10 MG tablet Take 10 mg by mouth daily.     Calcium-Cholecalciferol (QC CALCIUM 500MG -D3) 500-5 MG-MCG TABS  Take 1 tablet by mouth in the morning and at bedtime.     Cholecalciferol (VITAMIN D3) 125 MCG (5000 UT) CAPS Take 1 capsule (5,000 Units total) by mouth daily. 90 capsule 1   empagliflozin (JARDIANCE) 25 MG TABS tablet Take 1 tablet (25 mg total) by mouth daily. 30 tablet    etanercept (ENBREL) 50 MG/ML injection Inject 50 mg into the skin once a week.     fish oil-omega-3 fatty acids 1000 MG capsule Take 2 g by mouth daily.     Magnesium Oxide 420 MG TABS Take 420 mg by mouth daily after breakfast.  0   metFORMIN (GLUCOPHAGE) 1000 MG tablet Take 1,000 mg by mouth 2 (two) times daily with a meal.     methocarbamol (ROBAXIN) 500 MG tablet Take 1 tablet (500 mg total) by mouth every 8 (eight) hours as needed for muscle spasms. 90 tablet 0   metoprolol tartrate (LOPRESSOR) 25 MG tablet Take 25 mg by mouth 2 (two) times daily.     Multiple Vitamin (MULTIVITAMIN) tablet Take 1 tablet by mouth daily.     omeprazole (PRILOSEC) 20 MG capsule Take 20 mg by mouth daily.     pregabalin (LYRICA) 75 MG capsule Take 1 capsule (75 mg total) by mouth 2 (two) times daily. 60 capsule 5   RESTASIS 0.05 % ophthalmic emulsion Place 1 drop into both eyes 2 (two) times daily.     Semaglutide,0.25 or 0.5MG /DOS, 2 MG/3ML SOPN Inject 0.5 mg into the skin every 7 (seven) days.     No current  facility-administered medications for this Jones.    PHYSICAL EXAMINATION:  ECOG PERFORMANCE STATUS: 1 - Symptomatic but completely ambulatory   Vitals:   08/02/22 1329  BP: 131/62  Pulse: 62  Resp: 16  Temp: 97.7 F (36.5 C)  SpO2: 99%    Filed Weights   08/02/22 1329  Weight: 173 lb 14.4 oz (78.9 kg)     Physical Exam Vitals and nursing note reviewed.  Constitutional:      General: He is not in acute distress.    Appearance: Normal appearance. He is not ill-appearing, toxic-appearing or diaphoretic.     Comments: Here alone.  Has walker  HENT:     Head: Normocephalic and atraumatic.     Right Ear: External ear normal.     Left Ear: External ear normal.     Nose: Nose normal.  Eyes:     General: No scleral icterus.    Conjunctiva/sclera: Conjunctivae normal.     Pupils: Pupils are equal, round, and reactive to light.  Cardiovascular:     Rate and Rhythm: Normal rate and regular rhythm.     Heart sounds:     No friction rub. No gallop.  Pulmonary:     Effort: Pulmonary effort is normal. No respiratory distress.     Breath sounds: Normal breath sounds. No stridor. No wheezing or rhonchi.  Abdominal:     Palpations: Abdomen is soft. There is no mass.     Tenderness: There is no abdominal tenderness. There is no guarding.     Hernia: No hernia is present.  Musculoskeletal:        General: No deformity.     Cervical back: Normal range of motion and neck supple. No rigidity or tenderness.     Right lower leg: No edema.     Left lower leg: No edema.  Lymphadenopathy:     Head:     Right side of head:  No submental, submandibular, tonsillar, preauricular, posterior auricular or occipital adenopathy.     Left side of head: No submental, submandibular, tonsillar, preauricular, posterior auricular or occipital adenopathy.     Cervical: No cervical adenopathy.     Right cervical: No superficial, deep or posterior cervical adenopathy.    Left cervical: No superficial,  deep or posterior cervical adenopathy.     Upper Body:     Right upper body: No supraclavicular or axillary adenopathy.     Left upper body: No supraclavicular or axillary adenopathy.     Lower Body: No right inguinal adenopathy. No left inguinal adenopathy.  Skin:    Coloration: Skin is not jaundiced.     Findings: No bruising.  Neurological:     General: No focal deficit present.     Mental Status: He is alert and oriented to person, place, and time.     Motor: Weakness present.     Gait: Gait abnormal.     Comments: Has rollator  Psychiatric:        Mood and Affect: Mood normal.        Behavior: Behavior normal.        Thought Content: Thought content normal.        Judgment: Judgment normal.     LABORATORY DATA: I have personally reviewed the data as listed:  Admission on 07/12/2022, Discharged on 07/13/2022  Component Date Value Ref Range Status   ABO/RH(D) 07/12/2022    Final                   Value:O POS Performed at [facility], [address] [place], [place] [address]    Glucose-Capillary 07/12/2022 142 (H)  70 - 99 mg/dL Final   Glucose reference range applies only to samples taken after fasting for at least 8 hours.   Glucose-Capillary 07/12/2022 117 (H)  70 - 99 mg/dL Final   Glucose reference range applies only to samples taken after fasting for at least 8 hours.   Glucose-Capillary 07/12/2022 165 (H)  70 - 99 mg/dL Final   Glucose reference range applies only to samples taken after fasting for at least 8 hours.   Glucose-Capillary 07/12/2022 271 (H)  70 - 99 mg/dL Final   Glucose reference range applies only to samples taken after fasting for at least 8 hours.   Comment 1 07/12/2022 Notify RN   Final   Comment 2 07/12/2022 Document in Chart   Final   Glucose-Capillary 07/13/2022 215 (H)  70 - 99 mg/dL Final   Glucose reference range applies only to samples taken after fasting for at least 8 hours.   Comment 1 07/13/2022 Notify RN   Final   Comment 2  07/13/2022 Document in Chart   Final  Hospital Outpatient Jones on 07/05/2022  Component Date Value Ref Range Status   Glucose-Capillary 07/05/2022 124 (H)  70 - 99 mg/dL Final   Glucose reference range applies only to samples taken after fasting for at least 8 hours.   Hgb A1c MFr Bld 07/05/2022 5.9 (H)  4.8 - 5.6 % Final   Comment: (NOTE) Pre diabetes:          5.7%-6.4%  Diabetes:              >6.4%  Glycemic control for   <7.0% adults with diabetes    Mean Plasma Glucose 07/05/2022 122.63  mg/dL Final   Performed at [facility], [address], [place], [place] [address]  Sodium 07/05/2022 135  135 - 145 mmol/L Final   Potassium 07/05/2022 4.1  3.5 - 5.1 mmol/L Final   Chloride 07/05/2022 101  98 - 111 mmol/L Final   CO2 07/05/2022 25  22 - 32 mmol/L Final   Glucose, Bld 07/05/2022 113 (H)  70 - 99 mg/dL Final   Glucose reference range applies only to samples taken after fasting for at least 8 hours.   BUN 07/05/2022 13  8 - 23 mg/dL Final   Creatinine, Ser 07/05/2022 0.84  0.61 - 1.24 mg/dL Final   Calcium 16/12/9602 9.3  8.9 - 10.3 mg/dL Final   GFR, Estimated 07/05/2022 >60  >60 mL/min Final   Comment: (NOTE) Calculated using the CKD-EPI Creatinine Equation (2021)    Anion gap 07/05/2022 9  5 - 15 Final   Performed at [facility], [address], [place], [place] [address]   WBC 07/05/2022 11.6 (H)  4.0 - 10.5 K/uL Final   RBC 07/05/2022 4.88  4.22 - 5.81 MIL/uL Final   Hemoglobin 07/05/2022 13.8  13.0 - 17.0 g/dL Final   HCT 11/91/4782 41.8  39.0 - 52.0 % Final   MCV 07/05/2022 85.7  80.0 - 100.0 fL Final   MCH 07/05/2022 28.3  26.0 - 34.0 pg Final   MCHC 07/05/2022 33.0  30.0 - 36.0 g/dL Final   RDW 95/62/1308 17.3 (H)  11.5 - 15.5 % Final   Platelets 07/05/2022 319  150 - 400 K/uL Final   nRBC 07/05/2022 0.0  0.0 - 0.2 % Final   Performed at [facility], [address] [place], [place] [address]   MRSA, PCR 07/05/2022 NEGATIVE  NEGATIVE Final    Staphylococcus aureus 07/05/2022 NEGATIVE  NEGATIVE Final   Comment: (NOTE) The Xpert SA Assay (FDA approved for NASAL specimens in patients 57 years of age and older), is one component of a comprehensive surveillance program. It is not intended to diagnose infection nor to guide or monitor treatment. Performed at [facility], [address], [place], [place] [address]    ABO/RH(D) 07/05/2022 O POS   Final   Antibody Screen 07/05/2022 NEG   Final   Sample Expiration 07/05/2022 07/19/2022,2359   Final   Extend sample reason 07/05/2022    Final                   Value:NO TRANSFUSIONS OR PREGNANCY IN THE PAST 3 MONTHS Performed at [facility], [address] [place], [place] [address]     RADIOGRAPHIC STUDIES: I have personally reviewed the radiological images as listed and agree with the findings in the report  No results found.  ASSESSMENT/PLAN   82 y.o. male is here because of anemia.  Medical history notable for ankylosing spondylitis, CVA, diabetes mellitus type 2, hyperlipidemia, hypertension, seizure disorder, sleep apnea on CPAP  Anemia:  Patient noted to have normocytic anemia on labs drawn March 2023.  Secondary to iron deficiency due to occult GI bleeding exacerbated by antiplatelet therapy  June 11, 2022 Feraheme 510 mg June 18 2022: Feraheme 510 mg   July 05 2022- Hgb 13.8  Aug 02 2022- Ferritin 136.  To start low dose oral iron  June 2024-  GI consult       Cancer Staging  No matching staging information was found for the patient.   No problem-specific Assessment & Plan notes found for this encounter.    No orders of the defined types were placed  in this encounter.   30  minutes was spent in patient care.  This included time spent preparing to see the patient (e.g., review of tests), obtaining and/or reviewing separately obtained history, counseling and educating the patient, ordering medications, tests, or procedures; documenting  clinical information in the electronic or other health record, independently interpreting results and communicating results to the patient as well as coordination of care.       All questions were answered. The patient knows to call the clinic with any problems, questions or concerns.  This note was electronically signed.    Loni Muse, MD  08/02/2022 1:40 PM

## 2022-08-02 NOTE — Patient Instructions (Signed)
Please begin Flintstone's multivitamin with iron daily.  If you can not tolerate it daily then every other day is fine.

## 2022-08-05 ENCOUNTER — Ambulatory Visit (INDEPENDENT_AMBULATORY_CARE_PROVIDER_SITE_OTHER): Payer: Medicare Other

## 2022-08-05 ENCOUNTER — Ambulatory Visit: Payer: Medicare Other | Admitting: Sports Medicine

## 2022-08-05 VITALS — BP 130/72 | HR 84 | Ht 67.0 in | Wt 173.0 lb

## 2022-08-05 DIAGNOSIS — M4802 Spinal stenosis, cervical region: Secondary | ICD-10-CM | POA: Diagnosis not present

## 2022-08-05 DIAGNOSIS — M19012 Primary osteoarthritis, left shoulder: Secondary | ICD-10-CM | POA: Diagnosis not present

## 2022-08-05 DIAGNOSIS — M25512 Pain in left shoulder: Secondary | ICD-10-CM

## 2022-08-05 LAB — COPPER, SERUM: Copper: 76 ug/dL (ref 69–132)

## 2022-08-05 NOTE — Progress Notes (Signed)
Aleen Sells [facility] [address] [place] [address] Phone: [phone]   Assessment and Plan:     1. Acute pain of left shoulder -Acute, initial sports medicine visit - Most consistent with subacromial bursitis, likely from patient sleeping on his left side. - Patient has been recovering from C-spine fusion performed on 07/12/2022.  He saw his neurosurgeon earlier today who is happy with his progress.  I feel patient's presentation today is more consistent with shoulder pathology and is not consistent with cervical pathology - X-ray obtained in clinic.  My interpretation: No acute fracture or dislocation.  Mild degenerative changes and glenohumeral joint with glenoid spurring.  Moderately decreased AC joint space with cortical changes - Patient elected for subacromial CSI.  Tolerated well per note below.  Corticosteroid may temporarily increase blood glucose in patient with past medical history of DM type II - Start HEP for rotator cuff - Use Tylenol as needed for day-to-day pain relief - DG Shoulder Left; Future    Procedure: Subacromial Injection Side: Left  Risks explained and consent was given verbally. The site was cleaned with alcohol prep. A steroid injection was performed from posterior approach using 2mL of 1% lidocaine without epinephrine and 1mL of kenalog 40mg /ml. This was well tolerated and resulted in symptomatic relief.  Needle was removed, hemostasis achieved, and post injection instructions were explained.   Pt was advised to call or return to clinic if these symptoms worsen or fail to improve as anticipated.   Pertinent previous records reviewed include none   Follow Up: 3 to 4 weeks for reevaluation.  Could consider intra-articular versus AC joint CSI versus physical therapy if no improvement   Subjective:   I, Moenique Parris, am serving as a Neurosurgeon for Doctor Richardean Sale  Chief Complaint: left shoulder pain    HPI:   08/05/22 Patient is a 82 year old male complaining of left shoulder pain. Patient states that he has had left shoulder pain for a week , he slept wrong on it , decreased ROM , no pain until night around 8 am , isnt able to sleep through the night , no numbness or tingling,tylenol for the pain but that doesn't do much , pain radiates a little down the arm he can feel the muscles pulling , he has decreased grip strength   Relevant Historical Information: Ankylosing spondylitis, CKD, hyperlipidemia, hypertension, DM type II with neuropathy  Additional pertinent review of systems negative.   Current Outpatient Medications:    Alirocumab 75 MG/ML SOAJ, Inject 75 mg into the skin every 14 (fourteen) days., Disp: 2 mL, Rfl:    amLODipine (NORVASC) 10 MG tablet, Take 1 tablet (10 mg total) by mouth daily., Disp: , Rfl:    aspirin EC 81 MG tablet, Take 1 tablet (81 mg total) by mouth daily. Swallow whole., Disp: 30 tablet, Rfl: 11   benazepril (LOTENSIN) 10 MG tablet, Take 10 mg by mouth daily., Disp: , Rfl:    Calcium-Cholecalciferol (QC CALCIUM 500MG -D3) 500-5 MG-MCG TABS, Take 1 tablet by mouth in the morning and at bedtime., Disp: , Rfl:    Cholecalciferol (VITAMIN D3) 125 MCG (5000 UT) CAPS, Take 1 capsule (5,000 Units total) by mouth daily., Disp: 90 capsule, Rfl: 1   empagliflozin (JARDIANCE) 25 MG TABS tablet, Take 1 tablet (25 mg total) by mouth daily., Disp: 30 tablet, Rfl:    etanercept (ENBREL) 50 MG/ML injection, Inject 50 mg into the skin once a week.,  Disp: , Rfl:    fish oil-omega-3 fatty acids 1000 MG capsule, Take 2 g by mouth daily., Disp: , Rfl:    Magnesium Oxide 420 MG TABS, Take 420 mg by mouth daily after breakfast., Disp: , Rfl: 0   metFORMIN (GLUCOPHAGE) 1000 MG tablet, Take 1,000 mg by mouth 2 (two) times daily with a meal., Disp: , Rfl:    methocarbamol (ROBAXIN) 500 MG tablet, Take 1 tablet (500 mg total) by mouth every 8 (eight) hours as needed for muscle  spasms., Disp: 90 tablet, Rfl: 0   metoprolol tartrate (LOPRESSOR) 25 MG tablet, Take 25 mg by mouth 2 (two) times daily., Disp: , Rfl:    Multiple Vitamin (MULTIVITAMIN) tablet, Take 1 tablet by mouth daily., Disp: , Rfl:    omeprazole (PRILOSEC) 20 MG capsule, Take 20 mg by mouth daily., Disp: , Rfl:    pregabalin (LYRICA) 75 MG capsule, Take 1 capsule (75 mg total) by mouth 2 (two) times daily., Disp: 60 capsule, Rfl: 5   RESTASIS 0.05 % ophthalmic emulsion, Place 1 drop into both eyes 2 (two) times daily., Disp: , Rfl:    Semaglutide,0.25 or 0.5MG /DOS, 2 MG/3ML SOPN, Inject 0.5 mg into the skin every 7 (seven) days., Disp: , Rfl:    Objective:     Vitals:   08/05/22 1522  BP: 130/72  Pulse: 84  SpO2: 98%  Weight: 173 lb (78.5 kg)  Height: 5\' 7"  (1.702 m)      Body mass index is 27.1 kg/m.    Physical Exam:    Gen: Appears well, nad, nontoxic and pleasant Neuro:sensation intact, strength is 5/5 with df/pf/inv/ev, muscle tone wnl Skin: no suspicious lesion or defmority Psych: A&O, appropriate mood and affect  Left shoulder:  No deformity, swelling or muscle wasting No scapular winging FF 180 with painful end arc, abd 180 with painful end arc, int 0, ext 90 TTP AC, biceps groove, trapezius, coracoid NTTP over the [place], clavicle,  humerus, deltoid,  , cervical spine Neg neer, hawkins, empty can, obriens, crossarm, subscap liftoff, speeds Neg ant drawer, sulcus sign, apprehension Negative Spurling's test bilat FROM of neck    Electronically signed by:  Aleen Sells [facility] 3:39 PM 08/05/22

## 2022-08-05 NOTE — Patient Instructions (Signed)
Shoulder HEP Tylenol as needed  3-4 week follow up

## 2022-08-06 ENCOUNTER — Telehealth: Payer: Self-pay | Admitting: Oncology

## 2022-08-13 NOTE — Therapy (Signed)
OUTPATIENT PHYSICAL THERAPY EVALUATION   Patient Name: Andrew Jones MRN: [id number] DOB:[birth year], 82 y.o., male Today's Date: 08/14/2022   END OF SESSION:  PT End of Session - 08/14/22 1455     Visit Number 1    Number of Visits 17    Date for PT Re-Evaluation 10/09/22    Authorization Type [employer] MCR    Progress Note Due on Visit 10    PT Start Time 1445    PT Stop Time 1530    PT Time Calculation (min) 45 min    Activity Tolerance Patient tolerated treatment well    Behavior During Therapy [facility] for tasks assessed/performed             Past Medical History:  Diagnosis Date   Ankylosing spondylitis (HCC)    back, hips - on embrel 2000-2008, resumed 12/2012   CVA (cerebral vascular accident) (HCC) 1999, 2002   no residual problems   Diabetes mellitus, type 2 (HCC)    Dyslipidemia    Hypertension    Neuropathy    Bilateral Hands/Feet   Osteoarthritis    Seizure disorder (HCC) 05/23/2009   Sleep apnea    uses c-pap   Past Surgical History:  Procedure Laterality Date   ANTERIOR CERVICAL DECOMP/DISCECTOMY FUSION N/A 07/12/2022   Procedure: Anterior Cervical Decompression/Discectomy Fusion Cervical Five-Six, Cervical Six-Seven;  Surgeon: Lisbeth Renshaw, MD;  Location: [facility] OR;  Service: Neurosurgery;  Laterality: N/A;  3C   CATARACT EXTRACTION W/ INTRAOCULAR LENS IMPLANT Bilateral 2022   COLONOSCOPY  2015   INGUINAL HERNIA REPAIR Left 1947   Patient Active Problem List   Diagnosis Date Noted   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   Iron deficiency anemia 05/06/2022   DM (diabetes mellitus) (HCC) 04/03/2022   Hyperlipidemia associated with type 2 diabetes mellitus (HCC) 04/03/2022   Neuropathy 04/03/2022   Gastroesophageal reflux disease without esophagitis 04/03/2022   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) 07/25/2021   Vitamin D deficiency 07/25/2021   Obesity 07/03/2020   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   Low magnesium level 04/29/2019   Seborrheic keratosis 05/15/2018   Hearing loss 12/01/2017   Benign prostatic hyperplasia 12/20/2014   Ankylosing spondylitis of unspecified sites in spine (HCC) 12/20/2014   Encounter for therapeutic drug monitoring 12/08/2012   Seizure disorder (HCC)    Hypertension    CVA (cerebral vascular accident) (HCC)    Osteoarthritis     PCP: Anne Ng, NP  REFERRING PROVIDER: Lisbeth Renshaw, MD  REFERRING DIAG: Spinal stenosis, cervical region  THERAPY DIAG:  Cervicalgia  Muscle weakness (generalized)  Rationale for Evaluation and Treatment: Rehabilitation  ONSET DATE: 07/12/2022   SUBJECTIVE:  SUBJECTIVE STATEMENT: Patient reports he had cervical fusion surgery a few weeks ago and that has helped a lot with his pain. He does continue to have weakness in his legs and balance deficits. He is currently using a cane for his balance. He was previous using a walker for the prior 6 months and did have PT before surgery for leg weakness but that didn't help. Currently his neck is doing good, just a little tightness but he talked to his doctor regarding his continued leg weakness and balance issues and the doctor sent him back to therapy.   PERTINENT HISTORY:  Cervical fusion C5-7 07/12/2022  PAIN:  Are you having pain? No  PRECAUTIONS: Fall  WEIGHT BEARING RESTRICTIONS: No  FALLS:  Has patient fallen in last 6 months? No  PLOF: Independent  PATIENT GOALS: Improve leg strength and balance   OBJECTIVE:  PATIENT SURVEYS:  FOTO 56% functional status  COGNITION: Overall cognitive status: Within functional limits for tasks assessed  SENSATION: Patient reports bilateral foot  and LE numbness from neuropathy  POSTURE:   Rounded shoulders  FLEXIBILITY: Limitations bilateral hamstrings  LOWER EXTREMITY MMT:    MMT Right eval Left eval  Hip flexion 4- 4-  Hip extension 3- 3-  Hip abduction 3 3  Hip adduction    Hip internal rotation    Hip external rotation    Knee flexion 4+ 4+  Knee extension 4+ 4+  Ankle dorsiflexion    Ankle plantarflexion    Ankle inversion    Ankle eversion     (Blank rows = not tested)  BERG BALANCE TEST Sitting to Standing: 4.      Stands without using hands and stabilize independently Standing Unsupported: 4.      Stands safely for 2 minutes Sitting Unsupported: 4.     Sits for 2 minutes independently Standing to Sitting: 4.     Sits safely with minimal use of hands Transfers: 4.     Transfers safely with minor use of hands Standing with eyes closed: 3.     Stands 10 seconds with supervision Standing with feet together: 3.     Stands for 1 minute with supervision Reaching forward with outstretched arm: 4.     Reaches forward 10 inches Retrieving object from the floor: 4.      Able to pick up easily and safely Turning to look behind: 4.     Looks behind from both sides and weight shifts well Turning 360 degrees: 2.     Able to turn slowly, but safely Place alternate foot on stool: 1.     Completes >2 steps with minimal assist Standing with one foot in front: 0.     Loses balance while standing/stepping Standing on one foot: 0.     Unable Total Score: 41/56  FUNCTIONAL TESTS:  5 times sit to stand: 14 seconds 6 minute walk test: 925 ft using SPC BERG balance test: 41/56  GAIT: Distance walked: 925 ft Assistive device utilized: Single point cane Level of assistance: Modified independence Comments: Unsteady gait   TODAY'S TREATMENT: [facility] Adult PT Treatment:                                                DATE: 08/14/2022 Therapeutic Exercise: SLR x 10 each Bridge x 10 Sidelying clamshell x 20 each Sit to stand  x 10 Standing heel raises x 20 Tandem stance x 30 sec each  PATIENT EDUCATION:  Education details: Exam findings, POC, HEP Person educated: Patient Education method: Explanation, Demonstration, Tactile cues, Verbal cues, and Handouts Education comprehension: verbalized understanding, returned demonstration, verbal cues required, tactile cues required, and needs further education  HOME EXERCISE PROGRAM: Access Code: [email or web address]    ASSESSMENT: CLINICAL IMPRESSION: Patient is a 82 y.o. male who was seen today for physical therapy evaluation and treatment for persistent LE weakness and balance deficits following C5-7 fusion. He does demonstrate LE strength limitations and balance deficits that are likely impacting his walking tolerance and safety using assistive device.   OBJECTIVE IMPAIRMENTS: Abnormal gait, decreased activity tolerance, decreased balance, decreased strength, impaired flexibility, impaired sensation, and postural dysfunction.   ACTIVITY LIMITATIONS: lifting, standing, squatting, stairs, dressing, and locomotion level  PARTICIPATION LIMITATIONS: meal prep, cleaning, shopping, and community activity  PERSONAL FACTORS: Fitness, Past/current experiences, Time since onset of injury/illness/exacerbation, and 1 comorbidity: see PMH above  are also affecting patient's functional outcome.   REHAB POTENTIAL: Good  CLINICAL DECISION MAKING: Stable/uncomplicated  EVALUATION COMPLEXITY: Low   GOALS: Goals reviewed with patient? Yes  SHORT TERM GOALS: Target date: 09/11/2022  Patient will be I with initial HEP in order to progress with therapy. Baseline: HEP provided at eval Goal status: INITIAL  2.  Patient will perform 5xSTS </= 11 seconds in order to indicate improved LE strength and reduced fall risk Baseline: 14 seconds Goal status: INITIAL  LONG TERM GOALS: Target date: 10/09/2022  Patient will be I with final HEP to maintain progress from PT. Baseline: HEP provided  at eval Goal status: INITIAL  2.  Patient will report >/= 63% status on FOTO to indicate improved functional ability. Baseline: 56% functional status Goal status: INITIAL  3.  Patient will demonstrate gross hip strength >/= 4/5 MMT in order to improve standing and walking stability Baseline: limitations noted above Goal status: INITIAL  4.  Patient will perform >/= 1400 ft with LRAD in order to improve community access and safety with ambulation Baseline: 925 ft using SPC Goal status: INITIAL  5. Patient will demonstrate BERG balance >/= 48/56 in order to indicate improved balance and reduced fall risk  Baseline: 41/56  Goal status: INITIAL   PLAN: PT FREQUENCY: 1x/week  PT DURATION: 8 weeks  PLANNED INTERVENTIONS: Therapeutic exercises, Therapeutic activity, Neuromuscular re-education, Balance training, Gait training, Patient/Family education, Self Care, Joint mobilization, Aquatic Therapy, Dry Needling, Cryotherapy, Moist heat, Manual therapy, and Re-evaluation  PLAN FOR NEXT SESSION: Review HEP and progress PRN, progress LE strengthening and balance training, incorporate postural strengthening   Rosana Hoes, PT, DPT, LAT, ATC 08/14/22  4:27 PM Phone: [phone] Fax: [phone]

## 2022-08-14 ENCOUNTER — Ambulatory Visit: Payer: Medicare Other | Attending: Neurosurgery | Admitting: Physical Therapy

## 2022-08-14 ENCOUNTER — Encounter: Payer: Self-pay | Admitting: Physical Therapy

## 2022-08-14 ENCOUNTER — Other Ambulatory Visit: Payer: Self-pay

## 2022-08-14 DIAGNOSIS — M6281 Muscle weakness (generalized): Secondary | ICD-10-CM | POA: Diagnosis not present

## 2022-08-14 DIAGNOSIS — M542 Cervicalgia: Secondary | ICD-10-CM | POA: Insufficient documentation

## 2022-08-14 NOTE — Patient Instructions (Signed)
Access Code: [email or web address] URL: [email or web address] Date: 08/14/2022 Prepared by: Rosana Hoes  Exercises - Active Straight Leg Raise with Quad Set  - 1 x daily - 2 sets - 10 reps - Bridge  - 1 x daily - 2 sets - 10 reps - Clamshell  - 1 x daily - 2 sets - 20 reps - Sit to Stand Without Arm Support  - 1 x daily - 3 sets - 10 reps - Heel Raises with Counter Support  - 1 x daily - 2 sets - 20 reps - Standing Tandem Balance with Counter Support  - 1 x daily - 3 reps - 30 seconds hold

## 2022-08-20 ENCOUNTER — Ambulatory Visit: Payer: Medicare Other | Admitting: Physical Therapy

## 2022-08-20 ENCOUNTER — Encounter: Payer: Self-pay | Admitting: Physical Therapy

## 2022-08-20 DIAGNOSIS — M6281 Muscle weakness (generalized): Secondary | ICD-10-CM | POA: Diagnosis not present

## 2022-08-20 DIAGNOSIS — M542 Cervicalgia: Secondary | ICD-10-CM

## 2022-08-20 NOTE — Progress Notes (Signed)
Aleen [facility]ells [facility] [address] [place] [address] Phone: [phone]   Assessment and Plan:     1. Acute pain of left shoulder  -Acute, improved, subsequent visit - Significant improvement in left shoulder pain after subacromial CSI performed at previous office visit on 08/05/2022.  Most consistent with resolved subacromial bursitis - Continue HEP - May use Tylenol as needed for day-to-day pain relief  Pertinent previous records reviewed include none   Follow Up: As needed   Subjective:   I, Andrew Jones, am serving as a Neurosurgeon for Doctor Richardean Sale   Chief Complaint: left shoulder pain    HPI:    08/05/22 Andrew Jones is a 82 year old male complaining of left shoulder pain. Andrew Jones states that he has had left shoulder pain for a week , he slept wrong on it , decreased ROM , no pain until night around 8 am , isnt able to sleep through the night , no numbness or tingling,tylenol for the pain but that doesn't do much , pain radiates a little down the arm he can feel the muscles pulling , he has decreased grip strength   08/26/2022 Andrew Jones states that he is 99% better     Relevant Historical Information: Ankylosing spondylitis, CKD, hyperlipidemia, hypertension, DM type II with neuropathy  Additional pertinent review of systems negative.   Current Outpatient Medications:    Alirocumab 75 MG/ML SOAJ, Inject 75 mg into the skin every 14 (fourteen) days., Disp: 2 mL, Rfl:    amLODipine (NORVASC) 10 MG tablet, Take 1 tablet (10 mg total) by mouth daily., Disp: , Rfl:    aspirin EC 81 MG tablet, Take 1 tablet (81 mg total) by mouth daily. Swallow whole., Disp: 30 tablet, Rfl: 11   benazepril (LOTENSIN) 10 MG tablet, Take 10 mg by mouth daily., Disp: , Rfl:    Calcium-Cholecalciferol (QC CALCIUM 500MG -D3) 500-5 MG-MCG TABS, Take 1 tablet by mouth in the morning and at bedtime., Disp: , Rfl:    Cholecalciferol (VITAMIN D3) 125 MCG  (5000 UT) CAPS, Take 1 capsule (5,000 Units total) by mouth daily., Disp: 90 capsule, Rfl: 1   empagliflozin (JARDIANCE) 25 MG TABS tablet, Take 1 tablet (25 mg total) by mouth daily., Disp: 30 tablet, Rfl:    etanercept (ENBREL) 50 MG/ML injection, Inject 50 mg into the skin once a week., Disp: , Rfl:    fish oil-omega-3 fatty acids 1000 MG capsule, Take 2 g by mouth daily., Disp: , Rfl:    Magnesium Oxide 420 MG TABS, Take 420 mg by mouth daily after breakfast., Disp: , Rfl: 0   metFORMIN (GLUCOPHAGE) 1000 MG tablet, Take 1,000 mg by mouth 2 (two) times daily with a meal., Disp: , Rfl:    metoprolol tartrate (LOPRESSOR) 25 MG tablet, Take 25 mg by mouth 2 (two) times daily., Disp: , Rfl:    Multiple Vitamin (MULTIVITAMIN) tablet, Take 1 tablet by mouth daily., Disp: , Rfl:    omeprazole (PRILOSEC) 20 MG capsule, Take 20 mg by mouth daily., Disp: , Rfl:    pregabalin (LYRICA) 75 MG capsule, Take 1 capsule (75 mg total) by mouth 2 (two) times daily., Disp: 60 capsule, Rfl: 5   RESTASIS 0.05 % ophthalmic emulsion, Place 1 drop into both eyes 2 (two) times daily., Disp: , Rfl:    Semaglutide,0.25 or 0.5MG /DOS, 2 MG/3ML SOPN, Inject 0.5 mg into the skin every 7 (seven) days., Disp: , Rfl:    methocarbamol (ROBAXIN)  500 MG tablet, Take 1 tablet (500 mg total) by mouth every 8 (eight) hours as needed for muscle spasms., Disp: 90 tablet, Rfl: 0   Objective:     Vitals:   08/26/22 1408  BP: 122/80  Pulse: 99  SpO2: 99%  Weight: 166 lb (75.3 kg)  Height: 5\' 7"  (1.702 m)      Body mass index is 26 kg/m.    Physical Exam:    Gen: Appears well, nad, nontoxic and pleasant Neuro:sensation intact, strength is 5/5 with df/pf/inv/ev, muscle tone wnl Skin: no suspicious lesion or defmority Psych: A&O, appropriate mood and affect  Left shoulder:  No deformity, swelling or muscle wasting No scapular winging FF 180, abd 180, int 0, ext 90 NTTP over the [place], clavicle, ac, coracoid, biceps groove,  humerus, deltoid, trapezius, cervical spine  FROM of neck    Electronically signed by:  Aleen Sells [facility] 3:43 PM 08/26/22

## 2022-08-20 NOTE — Therapy (Signed)
OUTPATIENT PHYSICAL THERAPY EVALUATION   Patient Name: Andrew Jones MRN: [id number] DOB:1941/01/22, 82 y.o., male Today's Date: 08/20/2022   END OF SESSION:  PT End of Session - 08/20/22 1318     Visit Number 2    Number of Visits 17    Date for PT Re-Evaluation 10/09/22    Authorization Type BCBS MCR    PT Start Time 1315    PT Stop Time 1356    PT Time Calculation (min) 41 min             Past Medical History:  Diagnosis Date   Ankylosing spondylitis (HCC)    back, hips - on embrel 2000-2008, resumed 12/2012   CVA (cerebral vascular accident) (HCC) 1999, 2002   no residual problems   Diabetes mellitus, type 2 (HCC)    Dyslipidemia    Hypertension    Neuropathy    Bilateral Hands/Feet   Osteoarthritis    Seizure disorder (HCC) 05/23/2009   Sleep apnea    uses c-pap   Past Surgical History:  Procedure Laterality Date   ANTERIOR CERVICAL DECOMP/DISCECTOMY FUSION N/A 07/12/2022   Procedure: Anterior Cervical Decompression/Discectomy Fusion Cervical Five-Six, Cervical Six-Seven;  Surgeon: Lisbeth Renshaw, MD;  Location: [facility] OR;  Service: Neurosurgery;  Laterality: N/A;  3C   CATARACT EXTRACTION W/ INTRAOCULAR LENS IMPLANT Bilateral 2022   COLONOSCOPY  2015   INGUINAL HERNIA REPAIR Left 1947   Patient Active Problem List   Diagnosis Date Noted   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   Iron deficiency anemia 05/06/2022   DM (diabetes mellitus) (HCC) 04/03/2022   Hyperlipidemia associated with type 2 diabetes mellitus (HCC) 04/03/2022   Neuropathy 04/03/2022   Gastroesophageal reflux disease without esophagitis 04/03/2022   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) 07/25/2021   Vitamin D deficiency 07/25/2021   Obesity 07/03/2020   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   Low magnesium level 04/29/2019   Seborrheic keratosis 05/15/2018   Hearing loss  12/01/2017   Benign prostatic hyperplasia 12/20/2014   Ankylosing spondylitis of unspecified sites in spine (HCC) 12/20/2014   Encounter for therapeutic drug monitoring 12/08/2012   Seizure disorder (HCC)    Hypertension    CVA (cerebral vascular accident) (HCC)    Osteoarthritis     PCP: Anne Ng, NP  REFERRING PROVIDER: Lisbeth Renshaw, MD  REFERRING DIAG: Spinal stenosis, cervical region  THERAPY DIAG:  Cervicalgia  Muscle weakness (generalized)  Rationale for Evaluation and Treatment: Rehabilitation  ONSET DATE: 07/12/2022   SUBJECTIVE:  SUBJECTIVE STATEMENT: Patient reports he is doing well, no pain. He has been compliant with HEP.   PERTINENT HISTORY:  Cervical fusion C5-7 07/12/2022  PAIN:  Are you having pain? No  PRECAUTIONS: Fall  WEIGHT BEARING RESTRICTIONS: No  FALLS:  Has patient fallen in last 6 months? No  PLOF: Independent  PATIENT GOALS: Improve leg strength and balance   OBJECTIVE:  PATIENT SURVEYS:  FOTO 56% functional status  COGNITION: Overall cognitive status: Within functional limits for tasks assessed  SENSATION: Patient reports bilateral foot and LE numbness from neuropathy  POSTURE:   Rounded shoulders  FLEXIBILITY: Limitations bilateral hamstrings  LOWER EXTREMITY MMT:    MMT Right eval Left eval  Hip flexion 4- 4-  Hip extension 3- 3-  Hip abduction 3 3  Hip adduction    Hip internal rotation    Hip external rotation    Knee flexion 4+ 4+  Knee extension 4+ 4+  Ankle dorsiflexion    Ankle plantarflexion    Ankle inversion    Ankle eversion     (Blank rows = not tested)  BERG BALANCE TEST Sitting to Standing: 4.      Stands without using hands and stabilize independently Standing Unsupported: 4.       Stands safely for 2 minutes Sitting Unsupported: 4.     Sits for 2 minutes independently Standing to Sitting: 4.     Sits safely with minimal use of hands Transfers: 4.     Transfers safely with minor use of hands Standing with eyes closed: 3.     Stands 10 seconds with supervision Standing with feet together: 3.     Stands for 1 minute with supervision Reaching forward with outstretched arm: 4.     Reaches forward 10 inches Retrieving object from the floor: 4.      Able to pick up easily and safely Turning to look behind: 4.     Looks behind from both sides and weight shifts well Turning 360 degrees: 2.     Able to turn slowly, but safely Place alternate foot on stool: 1.     Completes >2 steps with minimal assist Standing with one foot in front: 0.     Loses balance while standing/stepping Standing on one foot: 0.     Unable Total Score: 41/56  FUNCTIONAL TESTS:  5 times sit to stand: 14 seconds 6 minute walk test: 925 ft using SPC BERG balance test: 41/56  GAIT: Distance walked: 925 ft Assistive device utilized: Single point cane Level of assistance: Modified independence Comments: Unsteady gait   TODAY'S TREATMENT: [facility] Adult PT Treatment:                                                DATE: 08/20/22 Therapeutic Exercise: Bilat heel raises x 15  STS x 20 Bridge x 20  SLR to fatigue each Side clam shell x 25  Seated scap retract (W back)    Neuromuscular re-ed: Tandem stance on and off AIREX Narrow stance with head turns, nods , OH reach Tandem gait  Side stepping  SLS 5-6 sec each leg best     [facility] Adult PT Treatment:  DATE: 08/14/2022 Therapeutic Exercise: SLR x 10 each Bridge x 10 Sidelying clamshell x 20 each Sit to stand x 10 Standing heel raises x 20 Tandem stance x 30 sec each  PATIENT EDUCATION:  Education details: Exam findings, POC, HEP Person educated: Patient Education method: Explanation,  Demonstration, Tactile cues, Verbal cues, and Handouts Education comprehension: verbalized understanding, returned demonstration, verbal cues required, tactile cues required, and needs further education  HOME EXERCISE PROGRAM: Access Code: [email or web address]    ASSESSMENT: CLINICAL IMPRESSION: Patient is a 82 y.o. male who was seen today for physical therapy treatment for persistent LE weakness and balance deficits following C5-7 fusion. He demonstrates improved SLS time and dynamic balance compared to his initial BERG score. Reviewed HEP and his independent. STG#1 met.    OBJECTIVE IMPAIRMENTS: Abnormal gait, decreased activity tolerance, decreased balance, decreased strength, impaired flexibility, impaired sensation, and postural dysfunction.   ACTIVITY LIMITATIONS: lifting, standing, squatting, stairs, dressing, and locomotion level  PARTICIPATION LIMITATIONS: meal prep, cleaning, shopping, and community activity  PERSONAL FACTORS: Fitness, Past/current experiences, Time since onset of injury/illness/exacerbation, and 1 comorbidity: see PMH above  are also affecting patient's functional outcome.   REHAB POTENTIAL: Good  CLINICAL DECISION MAKING: Stable/uncomplicated  EVALUATION COMPLEXITY: Low   GOALS: Goals reviewed with patient? Yes  SHORT TERM GOALS: Target date: 09/11/2022  Patient will be I with initial HEP in order to progress with therapy. Baseline: HEP provided at eval Goal status: MET  2.  Patient will perform 5xSTS </= 11 seconds in order to indicate improved LE strength and reduced fall risk Baseline: 14 seconds Goal status: INITIAL  LONG TERM GOALS: Target date: 10/09/2022  Patient will be I with final HEP to maintain progress from PT. Baseline: HEP provided at eval Goal status: INITIAL  2.  Patient will report >/= 63% status on FOTO to indicate improved functional ability. Baseline: 56% functional status Goal status: INITIAL  3.  Patient will demonstrate gross  hip strength >/= 4/5 MMT in order to improve standing and walking stability Baseline: limitations noted above Goal status: INITIAL  4.  Patient will perform >/= 1400 ft with LRAD in order to improve community access and safety with ambulation Baseline: 925 ft using SPC Goal status: INITIAL  5. Patient will demonstrate BERG balance >/= 48/56 in order to indicate improved balance and reduced fall risk  Baseline: 41/56  Goal status: INITIAL   PLAN: PT FREQUENCY: 1x/week  PT DURATION: 8 weeks  PLANNED INTERVENTIONS: Therapeutic exercises, Therapeutic activity, Neuromuscular re-education, Balance training, Gait training, Patient/Family education, Self Care, Joint mobilization, Aquatic Therapy, Dry Needling, Cryotherapy, Moist heat, Manual therapy, and Re-evaluation  PLAN FOR NEXT SESSION: Review HEP and progress PRN, progress LE strengthening and balance training, incorporate postural strengthening   Jannette Spanner, PTA 08/20/22 1:59 PM Phone: [phone] Fax: [phone]

## 2022-08-22 DIAGNOSIS — K080 Exfoliation of teeth due to systemic causes: Secondary | ICD-10-CM | POA: Diagnosis not present

## 2022-08-25 ENCOUNTER — Encounter: Payer: Self-pay | Admitting: Oncology

## 2022-08-25 DIAGNOSIS — Z7982 Long term (current) use of aspirin: Secondary | ICD-10-CM | POA: Insufficient documentation

## 2022-08-25 DIAGNOSIS — D500 Iron deficiency anemia secondary to blood loss (chronic): Secondary | ICD-10-CM

## 2022-08-25 DIAGNOSIS — D539 Nutritional anemia, unspecified: Secondary | ICD-10-CM | POA: Insufficient documentation

## 2022-08-25 HISTORY — DX: Iron deficiency anemia secondary to blood loss (chronic): D50.0

## 2022-08-26 ENCOUNTER — Ambulatory Visit: Payer: Medicare Other | Admitting: Sports Medicine

## 2022-08-26 VITALS — BP 122/80 | HR 99 | Ht 67.0 in | Wt 166.0 lb

## 2022-08-26 DIAGNOSIS — M25512 Pain in left shoulder: Secondary | ICD-10-CM | POA: Diagnosis not present

## 2022-08-27 ENCOUNTER — Telehealth: Payer: Self-pay | Admitting: Internal Medicine

## 2022-08-27 ENCOUNTER — Encounter: Payer: Self-pay | Admitting: Internal Medicine

## 2022-08-27 ENCOUNTER — Ambulatory Visit: Payer: Medicare Other | Admitting: Internal Medicine

## 2022-08-27 VITALS — BP 136/64 | HR 95 | Ht 67.0 in | Wt 166.0 lb

## 2022-08-27 DIAGNOSIS — E119 Type 2 diabetes mellitus without complications: Secondary | ICD-10-CM | POA: Diagnosis not present

## 2022-08-27 DIAGNOSIS — Z794 Long term (current) use of insulin: Secondary | ICD-10-CM | POA: Diagnosis not present

## 2022-08-27 DIAGNOSIS — D500 Iron deficiency anemia secondary to blood loss (chronic): Secondary | ICD-10-CM

## 2022-08-27 NOTE — Telephone Encounter (Signed)
Patient scheduled for procedure. Please issue prep instructions.

## 2022-08-27 NOTE — Patient Instructions (Signed)
  Call us back to schedule your endoscopy/colonoscopy

## 2022-08-27 NOTE — Progress Notes (Signed)
HISTORY OF PRESENT ILLNESS:  Andrew Jones is a 82 y.o. 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  I last saw the patient November 13, 2011 when he underwent colonoscopy for the purposes of screening.  He was found to have an 8 mm sessile polyp in the ascending colon which was deemed hyperplastic.  Mild pandiverticulosis seen incidentally.  Otherwise normal.  He has not been seen since.  Patient gets routine blood work and his medications at the [facility].  About 3 months ago he was told by the [facility] that he had anemia with low iron.  He could not tolerate oral iron due to severe constipation and was sent to hematology who provided iron infusions.  He has had improvement in his blood counts.  Last hemoglobin July 05, 2022 was 13.8.  Hematology recommended further evaluation of his iron deficiency anemia.  Patient does take an aspirin.  No blood thinners.  He was placed on omeprazole years ago when he was taking NSAIDs for his arthritis.  No longer taking NSAIDs.  Still on PPI.  He is now taking a Flintstone vitamin with iron for his iron deficiency anemia.  He reports having had a negative fit test within the past year.  He is on multiple medications for diabetes including Ozempic.  On Ozempic he is lost 20 pounds and had significant improvement in his hemoglobin A1c.  His GI review of systems is unremarkable.  No reflux, abdominal pain, melena, hematochezia, or change in bowel habits.  He did have some transient mild dysphagia, which has resolved, secondary to cervical spine surgery.  No family history of colon cancer.  CT scan April 2021 and ultrasound August 2021 reviewed.  No acute abnormalities.  REVIEW OF SYSTEMS:  All non-GI ROS negative unless otherwise stated in the HPI   Past Medical History:  Diagnosis Date   Ankylosing spondylitis (HCC)    back, hips - on  embrel 2000-2008, resumed 12/2012   CVA (cerebral vascular accident) (HCC) 1999, 2002   no residual problems   Diabetes mellitus, type 2 (HCC)    Dyslipidemia    Hypertension    Neuropathy    Bilateral Hands/Feet   Osteoarthritis    Seizure disorder (HCC) 05/23/2009   Sleep apnea    uses c-pap    Past Surgical History:  Procedure Laterality Date   ANTERIOR CERVICAL DECOMP/DISCECTOMY FUSION N/A 07/12/2022   Procedure: Anterior Cervical Decompression/Discectomy Fusion Cervical Five-Six, Cervical Six-Seven;  Surgeon: Lisbeth Renshaw, MD;  Location: [facility] OR;  Service: Neurosurgery;  Laterality: N/A;  3C   CATARACT EXTRACTION W/ INTRAOCULAR LENS IMPLANT Bilateral 2022   COLONOSCOPY  2015   INGUINAL HERNIA REPAIR Left 1947    Social History TOI DOVELL  reports that he quit smoking about 30 years ago. His smoking use included cigarettes. He has never used smokeless tobacco. He reports that he does not currently use alcohol. He reports that he does not use drugs.  family history includes COPD in his father; Esophageal cancer in an other family member; Heart disease in his mother; Heart failure in his mother; Prostate cancer in his brother.  Allergies  Allergen Reactions   Simvastatin Other (See Comments)       PHYSICAL EXAMINATION: Vital signs: BP 136/64   Pulse 95   Ht 5\' 7"  (1.702 m)   Wt 166 lb (75.3 kg)   BMI 26.00 kg/m  Constitutional: generally well-appearing, no acute distress Psychiatric: alert and oriented x3, cooperative Eyes: extraocular movements intact, anicteric, conjunctiva pink Ears: Hearing aids Mouth: oral pharynx moist, no lesions Neck: supple no lymphadenopathy Cardiovascular: heart regular rate and rhythm, no murmur Lungs: clear to auscultation bilaterally Abdomen: soft, nontender, nondistended, no obvious ascites, no peritoneal signs, normal bowel sounds, no organomegaly Rectal: Deferred until colonoscopy Extremities: no clubbing, cyanosis, or  lower extremity edema bilaterally Skin: no lesions on visible extremities Neuro: No focal deficits.  Cranial nerves intact  ASSESSMENT:  1.  Iron deficiency anemia.  Rule out GI mucosal lesion 2.  Colonoscopy 2013 with right-sided hyperplastic polyp 3.  Multiple general medical problems including diabetes   PLAN:  1.  Colonoscopy to evaluate iron deficiency anemia.The nature of the procedure, as well as the risks, benefits, and alternatives were carefully and thoroughly reviewed with the patient. Ample time for discussion and questions allowed. The patient understood, was satisfied, and agreed to proceed. 2.  Upper endoscopy to evaluate iron deficiency anemia.The nature of the procedure, as well as the risks, benefits, and alternatives were carefully and thoroughly reviewed with the patient. Ample time for discussion and questions allowed. The patient understood, was satisfied, and agreed to proceed. 3.  Hold diabetic medications the day of the procedure in order to avoid unwanted hypoglycemia. 4.  Hold Ozempic at least 1 week prior to the procedures.  He agrees. 5.  Ongoing monitoring of iron deficiency anemia per hematology A total time of 60 minutes was spent preparing to see the patient, obtaining comprehensive history, reviewing the myriad of data, performing comprehensive physical exam, counseling and educating the patient regarding above listed issues, ordering multiple endoscopic procedures, adjusting medications for his procedures, and documenting clinical information in the health record

## 2022-08-28 ENCOUNTER — Ambulatory Visit: Payer: Medicare Other | Attending: Neurosurgery

## 2022-08-28 DIAGNOSIS — M6281 Muscle weakness (generalized): Secondary | ICD-10-CM | POA: Diagnosis not present

## 2022-08-28 DIAGNOSIS — M542 Cervicalgia: Secondary | ICD-10-CM | POA: Insufficient documentation

## 2022-08-28 NOTE — Therapy (Signed)
OUTPATIENT PHYSICAL THERAPY TREATMENT NOTE   Patient Name: Andrew Jones MRN: [id number] DOB:[birth year], 82 y.o., male Today's Date: 08/28/2022   END OF SESSION:  PT End of Session - 08/28/22 1213     Visit Number 3    Number of Visits 17    Date for PT Re-Evaluation 10/09/22    Authorization Type BCBS MCR    Progress Note Due on Visit 10    PT Start Time 1215    PT Stop Time 1255    PT Time Calculation (min) 40 min    Equipment Utilized During Treatment Gait belt    Activity Tolerance Patient tolerated treatment well    Behavior During Therapy WFL for tasks assessed/performed              Past Medical History:  Diagnosis Date   Ankylosing spondylitis (HCC)    back, hips - on embrel 2000-2008, resumed 12/2012   CVA (cerebral vascular accident) (HCC) 1999, 2002   no residual problems   Diabetes mellitus, type 2 (HCC)    Dyslipidemia    Hypertension    Neuropathy    Bilateral Hands/Feet   Osteoarthritis    Seizure disorder (HCC) 05/23/2009   Sleep apnea    uses c-pap   Past Surgical History:  Procedure Laterality Date   ANTERIOR CERVICAL DECOMP/DISCECTOMY FUSION N/A 07/12/2022   Procedure: Anterior Cervical Decompression/Discectomy Fusion Cervical Five-Six, Cervical Six-Seven;  Surgeon: Lisbeth Renshaw, MD;  Location: [facility] OR;  Service: Neurosurgery;  Laterality: N/A;  3C   CATARACT EXTRACTION W/ INTRAOCULAR LENS IMPLANT Bilateral 2022   COLONOSCOPY  2015   INGUINAL HERNIA REPAIR Left 1947   Patient Active Problem List   Diagnosis Date Noted   Deficiency anemia 08/25/2022   Anemia due to GI blood loss 08/25/2022   Long-term use of aspirin therapy 08/25/2022   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   Iron deficiency anemia 05/06/2022   DM (diabetes mellitus) (HCC) 04/03/2022   Hyperlipidemia associated with type 2 diabetes mellitus (HCC) 04/03/2022   Neuropathy 04/03/2022   Gastroesophageal reflux disease without esophagitis 04/03/2022    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) 07/25/2021   Vitamin D deficiency 07/25/2021   Obesity 07/03/2020   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   Low magnesium level 04/29/2019   Seborrheic keratosis 05/15/2018   Hearing loss 12/01/2017   Benign prostatic hyperplasia 12/20/2014   Ankylosing spondylitis of unspecified sites in spine (HCC) 12/20/2014   Encounter for therapeutic drug monitoring 12/08/2012   Seizure disorder (HCC)    Hypertension    CVA (cerebral vascular accident) (HCC)    Osteoarthritis     PCP: Anne Ng, NP  REFERRING PROVIDER: Lisbeth Renshaw, MD  REFERRING DIAG: Spinal stenosis, cervical region  THERAPY DIAG:  Cervicalgia  Muscle weakness (generalized)  Rationale for Evaluation and Treatment: Rehabilitation  ONSET DATE: 07/12/2022   SUBJECTIVE:  SUBJECTIVE STATEMENT: Patient reports no current pain  PERTINENT HISTORY:  Cervical fusion C5-7 07/12/2022  PAIN:  Are you having pain? No   PRECAUTIONS: Fall  WEIGHT BEARING RESTRICTIONS: No  FALLS:  Has patient fallen in last 6 months? No  PLOF: Independent  PATIENT GOALS: Improve leg strength and balance   OBJECTIVE:  PATIENT SURVEYS:  FOTO 56% functional status  COGNITION: Overall cognitive status: Within functional limits for tasks assessed  SENSATION: Patient reports bilateral foot and LE numbness from neuropathy  POSTURE:   Rounded shoulders  FLEXIBILITY: Limitations bilateral hamstrings  LOWER EXTREMITY MMT:    MMT Right eval Left eval  Hip flexion 4- 4-  Hip extension 3- 3-  Hip abduction 3 3  Hip adduction    Hip internal rotation    Hip external rotation    Knee flexion  4+ 4+  Knee extension 4+ 4+  Ankle dorsiflexion    Ankle plantarflexion    Ankle inversion    Ankle eversion     (Blank rows = not tested)  BERG BALANCE TEST Sitting to Standing: 4.      Stands without using hands and stabilize independently Standing Unsupported: 4.      Stands safely for 2 minutes Sitting Unsupported: 4.     Sits for 2 minutes independently Standing to Sitting: 4.     Sits safely with minimal use of hands Transfers: 4.     Transfers safely with minor use of hands Standing with eyes closed: 3.     Stands 10 seconds with supervision Standing with feet together: 3.     Stands for 1 minute with supervision Reaching forward with outstretched arm: 4.     Reaches forward 10 inches Retrieving object from the floor: 4.      Able to pick up easily and safely Turning to look behind: 4.     Looks behind from both sides and weight shifts well Turning 360 degrees: 2.     Able to turn slowly, but safely Place alternate foot on stool: 1.     Completes >2 steps with minimal assist Standing with one foot in front: 0.     Loses balance while standing/stepping Standing on one foot: 0.     Unable Total Score: 41/56  FUNCTIONAL TESTS:  5 times sit to stand: 14 seconds 6 minute walk test: 925 ft using SPC BERG balance test: 41/56  GAIT: Distance walked: 925 ft Assistive device utilized: Single point cane Level of assistance: Modified independence Comments: Unsteady gait   TODAY'S TREATMENT: [facility] Adult PT Treatment:                                                DATE: 08/28/22 Therapeutic Exercise: Bilat heel raises 2x10 STS x 10 no UE Bridge 2x10 SLR to fatigue each Rt 19, Lt 22 Side clam shell x 25  Standing rows RTB 2x10 Standing shoulder extension RTB 2x10 Neuromuscular re-ed: Tandem stance x30" BIL Tandem stance on Airex x30" BIL Tandem walking at counter x2 laps fwd FT on Airex with head turns/nods x30" Standing on Airex and tapping foot to 6" step 2x10 BIL Obstacle  course: cone weaving, 4" step with Airex on top step up and over, hurdle step overs x3 laps (with gait belt, hurdles difficult)  [facility] Adult PT Treatment:  DATE: 08/20/22 Therapeutic Exercise: Bilat heel raises x 15  STS x 20 Bridge x 20  SLR to fatigue each Side clam shell x 25  Seated scap retract (W back)    Neuromuscular re-ed: Tandem stance on and off AIREX Narrow stance with head turns, nods , OH reach Tandem gait  Side stepping  SLS 5-6 sec each leg best     [facility] Adult PT Treatment:                                                DATE: 08/14/2022 Therapeutic Exercise: SLR x 10 each Bridge x 10 Sidelying clamshell x 20 each Sit to stand x 10 Standing heel raises x 20 Tandem stance x 30 sec each  PATIENT EDUCATION:  Education details: Exam findings, POC, HEP Person educated: Patient Education method: Explanation, Demonstration, Tactile cues, Verbal cues, and Handouts Education comprehension: verbalized understanding, returned demonstration, verbal cues required, tactile cues required, and needs further education  HOME EXERCISE PROGRAM: Access Code: [email or web address]    ASSESSMENT: CLINICAL IMPRESSION: Patient presents to PT reporting no current pain and HEP compliance. Session today focused on periscapular and LE strengthening as well as balance tasks to decrease fall risk. Patient was able to tolerate all prescribed exercises with no adverse effects. He had the most difficulty during obstacle course with hurdle step overs, he reports increased "clumsiness" with these and also performs them quickly as he fears falling if he slows down. Patient continues to benefit from skilled PT services and should be progressed as able to improve functional independence.    OBJECTIVE IMPAIRMENTS: Abnormal gait, decreased activity tolerance, decreased balance, decreased strength, impaired flexibility, impaired sensation, and postural dysfunction.    ACTIVITY LIMITATIONS: lifting, standing, squatting, stairs, dressing, and locomotion level  PARTICIPATION LIMITATIONS: meal prep, cleaning, shopping, and community activity  PERSONAL FACTORS: Fitness, Past/current experiences, Time since onset of injury/illness/exacerbation, and 1 comorbidity: see PMH above  are also affecting patient's functional outcome.   REHAB POTENTIAL: Good  CLINICAL DECISION MAKING: Stable/uncomplicated  EVALUATION COMPLEXITY: Low   GOALS: Goals reviewed with patient? Yes  SHORT TERM GOALS: Target date: 09/11/2022  Patient will be I with initial HEP in order to progress with therapy. Baseline: HEP provided at eval Goal status: MET  2.  Patient will perform 5xSTS </= 11 seconds in order to indicate improved LE strength and reduced fall risk Baseline: 14 seconds Goal status: INITIAL  LONG TERM GOALS: Target date: 10/09/2022  Patient will be I with final HEP to maintain progress from PT. Baseline: HEP provided at eval Goal status: INITIAL  2.  Patient will report >/= 63% status on FOTO to indicate improved functional ability. Baseline: 56% functional status Goal status: INITIAL  3.  Patient will demonstrate gross hip strength >/= 4/5 MMT in order to improve standing and walking stability Baseline: limitations noted above Goal status: INITIAL  4.  Patient will perform >/= 1400 ft with LRAD in order to improve community access and safety with ambulation Baseline: 925 ft using SPC Goal status: INITIAL  5. Patient will demonstrate BERG balance >/= 48/56 in order to indicate improved balance and reduced fall risk  Baseline: 41/56  Goal status: INITIAL   PLAN: PT FREQUENCY: 1x/week  PT DURATION: 8 weeks  PLANNED INTERVENTIONS: Therapeutic exercises, Therapeutic activity, Neuromuscular re-education, Balance training, Gait training, Patient/Family education, Self Care,  Joint mobilization, Aquatic Therapy, Dry Needling, Cryotherapy, Moist heat,  Manual therapy, and Re-evaluation  PLAN FOR NEXT SESSION: Review HEP and progress PRN, progress LE strengthening and balance training, incorporate postural strengthening   Berta Minor PTA 08/28/22 12:50 PM Phone: [phone] Fax: [phone]

## 2022-08-29 NOTE — Telephone Encounter (Signed)
Spoke to patient and scheduled him for a telephone previsit on 10/08/2022.  Patient agreed

## 2022-09-03 ENCOUNTER — Ambulatory Visit (INDEPENDENT_AMBULATORY_CARE_PROVIDER_SITE_OTHER): Payer: Medicare Other | Admitting: Nurse Practitioner

## 2022-09-03 ENCOUNTER — Encounter: Payer: Self-pay | Admitting: Nurse Practitioner

## 2022-09-03 VITALS — BP 124/70 | HR 71 | Temp 97.8°F | Resp 16 | Ht 67.0 in | Wt 165.0 lb

## 2022-09-03 DIAGNOSIS — G629 Polyneuropathy, unspecified: Secondary | ICD-10-CM

## 2022-09-03 DIAGNOSIS — E1169 Type 2 diabetes mellitus with other specified complication: Secondary | ICD-10-CM | POA: Diagnosis not present

## 2022-09-03 DIAGNOSIS — D500 Iron deficiency anemia secondary to blood loss (chronic): Secondary | ICD-10-CM

## 2022-09-03 DIAGNOSIS — R269 Unspecified abnormalities of gait and mobility: Secondary | ICD-10-CM | POA: Insufficient documentation

## 2022-09-03 DIAGNOSIS — Z8673 Personal history of transient ischemic attack (TIA), and cerebral infarction without residual deficits: Secondary | ICD-10-CM | POA: Insufficient documentation

## 2022-09-03 DIAGNOSIS — Z7985 Long-term (current) use of injectable non-insulin antidiabetic drugs: Secondary | ICD-10-CM

## 2022-09-03 DIAGNOSIS — I10 Essential (primary) hypertension: Secondary | ICD-10-CM

## 2022-09-03 DIAGNOSIS — Z7984 Long term (current) use of oral hypoglycemic drugs: Secondary | ICD-10-CM

## 2022-09-03 NOTE — Assessment & Plan Note (Signed)
hgbA1c at 5.9% UTD with eye exam by Dr. [place], report requested Normal UACr LDL at goal, unable to tolerate statin Controlled glucose with use of ozempic, metformin, and jardiance [facility] F/up in 6months

## 2022-09-03 NOTE — Patient Instructions (Signed)
Maintain current med doses Send labs results from [place]

## 2022-09-03 NOTE — Assessment & Plan Note (Signed)
Improved with use of lyrica and post cervical spine decompression and discectomy by Dr. Suzette Battiest. Ongoing PT Maintain lyrica dose

## 2022-09-03 NOTE — Progress Notes (Signed)
Established Patient Visit  Patient: Andrew Jones   DOB: 12-09-1940   82 y.o. Male  MRN: [id number] Visit Date: 09/03/2022  Subjective:    Chief Complaint  Patient presents with   Hypertension   Diabetes   Neuropathy Improved with use of lyrica and post cervical spine decompression and discectomy by Dr. Suzette Battiest. Ongoing PT Maintain lyrica dose  DM (diabetes mellitus) (HCC) hgbA1c at 5.9% UTD with eye exam by Dr. [place], report requested Normal UACr LDL at goal, unable to tolerate statin Controlled glucose with use of ozempic, metformin, and jardiance [facility]months  Hypertension BP at goal with amlodipine and benazepril BP Readings from Last 3 Encounters:  09/03/22 124/70  08/27/22 136/64  08/26/22 122/80    Prescriptions provided by [facility] provider  Iron deficiency anemia Corrected with iron infusion Upcoming appointment for upper and lower endoscopy by Dr. Marina Goodell 10/22/22  Wt Readings from Last 3 Encounters:  09/03/22 165 lb (74.8 kg)  08/27/22 166 lb (75.3 kg)  08/26/22 166 lb (75.3 kg)    Reviewed medical, surgical, and social history today  Medications: Outpatient Medications Prior to Visit  Medication Sig   Alirocumab 75 MG/ML SOAJ Inject 75 mg into the skin every 14 (fourteen) days.   amLODipine (NORVASC) 10 MG tablet Take 1 tablet (10 mg total) by mouth daily.   aspirin EC 81 MG tablet Take 1 tablet (81 mg total) by mouth daily. Swallow whole.   benazepril (LOTENSIN) 10 MG tablet Take 10 mg by mouth daily.   Calcium-Cholecalciferol (QC CALCIUM 500MG -D3) 500-5 MG-MCG TABS Take 1 tablet by mouth in the morning and at bedtime.   Cholecalciferol (VITAMIN D3) 125 MCG (5000 UT) CAPS Take 1 capsule (5,000 Units total) by mouth daily.   empagliflozin (JARDIANCE) 25 MG TABS tablet Take 1 tablet (25 mg total) by mouth daily.   etanercept (ENBREL) 50 MG/ML injection Inject 50 mg into the skin once a week.   fish  oil-omega-3 fatty acids 1000 MG capsule Take 2 g by mouth daily.   Magnesium Oxide 420 MG TABS Take 420 mg by mouth daily after breakfast.   metFORMIN (GLUCOPHAGE) 1000 MG tablet Take 1,000 mg by mouth 2 (two) times daily with a meal.   metoprolol tartrate (LOPRESSOR) 25 MG tablet Take 25 mg by mouth 2 (two) times daily.   Multiple Vitamin (MULTIVITAMIN) tablet Take 1 tablet by mouth daily.   omeprazole (PRILOSEC) 20 MG capsule Take 20 mg by mouth daily.   pregabalin (LYRICA) 75 MG capsule Take 1 capsule (75 mg total) by mouth 2 (two) times daily.   RESTASIS 0.05 % ophthalmic emulsion Place 1 drop into both eyes 2 (two) times daily.   Semaglutide,0.25 or 0.5MG /DOS, 2 MG/3ML SOPN Inject 0.5 mg into the skin every 7 (seven) days.   [DISCONTINUED] methocarbamol (ROBAXIN) 500 MG tablet Take 1 tablet (500 mg total) by mouth every 8 (eight) hours as needed for muscle spasms.   No facility-administered medications prior to visit.   Reviewed past medical and social history.   ROS per HPI above      Objective:  BP 124/70 (BP Location: Left Arm, Patient Position: Sitting, Cuff Size: Large)   Pulse 71   Temp 97.8 F (36.6 C) (Temporal)   Resp 16   Ht 5\' 7"  (1.702 m)   Wt 165 lb (74.8 kg)   SpO2 99%  BMI 25.84 kg/m      Physical Exam Cardiovascular:     Rate and Rhythm: Normal rate and regular rhythm.     Pulses: Normal pulses.     Heart sounds: Normal heart sounds.  Pulmonary:     Effort: Pulmonary effort is normal.     Breath sounds: Normal breath sounds.  Musculoskeletal:     Right lower leg: No edema.     Left lower leg: No edema.  Neurological:     Mental Status: He is alert and oriented to person, place, and time.     No results found for any visits on 09/03/22.    Assessment & Plan:    Problem List Items Addressed This Visit       Cardiovascular and Mediastinum   Hypertension    BP at goal with amlodipine and benazepril BP Readings from Last 3 Encounters:   09/03/22 124/70  08/27/22 136/64  08/26/22 122/80    Prescriptions provided by [facility] provider        Endocrine   DM (diabetes mellitus) (HCC)    hgbA1c at 5.9% UTD with eye exam by Dr. [place], report requested Normal UACr LDL at goal, unable to tolerate statin Controlled glucose with use of ozempic, metformin, and jardiance [facility] F/up in 6months        Nervous and Auditory   Neuropathy - Primary    Improved with use of lyrica and post cervical spine decompression and discectomy by Dr. Suzette Battiest. Ongoing PT Maintain lyrica dose        Other   Iron deficiency anemia    Corrected with iron infusion Upcoming appointment for upper and lower endoscopy by Dr. Marina Goodell 10/22/22      Return in about 6 months (around 03/05/2023) for HTN, DM, hyperlipidemia (fasting).     Alysia Penna, NP

## 2022-09-03 NOTE — Assessment & Plan Note (Signed)
>>  ASSESSMENT AND PLAN FOR NEUROPATHY WRITTEN ON 09/03/2022 11:28 AM BY Vannesa Abair LUM, NP  Improved with use of lyrica and post cervical spine decompression and discectomy by Dr. Suzette Battiest. Ongoing PT Maintain lyrica dose

## 2022-09-03 NOTE — Assessment & Plan Note (Addendum)
Corrected with iron infusion Upcoming appointment for upper and lower endoscopy by Dr. Marina Goodell 10/22/22

## 2022-09-03 NOTE — Assessment & Plan Note (Signed)
BP at goal with amlodipine and benazepril BP Readings from Last 3 Encounters:  09/03/22 124/70  08/27/22 136/64  08/26/22 122/80    Prescriptions provided by [facility] provider

## 2022-09-04 ENCOUNTER — Ambulatory Visit: Payer: Medicare Other

## 2022-09-04 ENCOUNTER — Encounter: Payer: Self-pay | Admitting: Nurse Practitioner

## 2022-09-04 DIAGNOSIS — M6281 Muscle weakness (generalized): Secondary | ICD-10-CM | POA: Diagnosis not present

## 2022-09-04 DIAGNOSIS — M542 Cervicalgia: Secondary | ICD-10-CM | POA: Diagnosis not present

## 2022-09-04 NOTE — Therapy (Signed)
OUTPATIENT PHYSICAL THERAPY TREATMENT NOTE   Patient Name: Andrew Jones MRN: [id number] DOB:1940-06-25, 82 y.o., male Today's Date: 09/04/2022   END OF SESSION:  PT End of Session - 09/04/22 1212     Visit Number 4    Number of Visits 17    Date for PT Re-Evaluation 10/09/22    Authorization Type [employer] MCR    Progress Note Due on Visit 10    PT Start Time 1215    PT Stop Time 1255    PT Time Calculation (min) 40 min    Activity Tolerance Patient tolerated treatment well    Behavior During Therapy WFL for tasks assessed/performed             Past Medical History:  Diagnosis Date   Anemia due to GI blood loss 08/25/2022   Ankylosing spondylitis (HCC)    back, hips - on embrel 2000-2008, resumed 12/2012   CVA (cerebral vascular accident) (HCC) 1999, 2002   no residual problems   Diabetes mellitus, type 2 (HCC)    Dyslipidemia    Hypertension    Neuropathy    Bilateral Hands/Feet   Osteoarthritis    Seizure disorder (HCC) 05/23/2009   Sleep apnea    uses c-pap   Past Surgical History:  Procedure Laterality Date   ANTERIOR CERVICAL DECOMP/DISCECTOMY FUSION N/A 07/12/2022   Procedure: Anterior Cervical Decompression/Discectomy Fusion Cervical Five-Six, Cervical Six-Seven;  Surgeon: Lisbeth Renshaw, MD;  Location: [facility] OR;  Service: Neurosurgery;  Laterality: N/A;  3C   CATARACT EXTRACTION W/ INTRAOCULAR LENS IMPLANT Bilateral 2022   COLONOSCOPY  2015   INGUINAL HERNIA REPAIR Left 1947   Patient Active Problem List   Diagnosis Date Noted   Abnormal gait 09/03/2022   History of CVA (cerebrovascular accident) without residual deficits 09/03/2022   Long-term use of aspirin therapy 08/25/2022   Spinal stenosis in cervical region 07/12/2022   Iron deficiency anemia 05/06/2022   DM (diabetes mellitus) (HCC) 04/03/2022   Hyperlipidemia associated with type 2 diabetes mellitus (HCC) 04/03/2022   Neuropathy 04/03/2022   Gastroesophageal reflux disease without  esophagitis 04/03/2022   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) 07/25/2021   Vitamin D deficiency 07/25/2021   Obesity 07/03/2020   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   Low magnesium level 04/29/2019   Seborrheic keratosis 05/15/2018   Hearing loss 12/01/2017   Benign prostatic hyperplasia 12/20/2014   Ankylosing spondylitis of unspecified sites in spine (HCC) 12/20/2014   Encounter for therapeutic drug monitoring 12/08/2012   Seizure disorder (HCC)    Hypertension    Osteoarthritis     PCP: Anne Ng, NP  REFERRING PROVIDER: Lisbeth Renshaw, MD  REFERRING DIAG: Spinal stenosis, cervical region  THERAPY DIAG:  Cervicalgia  Muscle weakness (generalized)  Rationale for Evaluation and Treatment: Rehabilitation  ONSET DATE: 07/12/2022   SUBJECTIVE:  SUBJECTIVE STATEMENT: Patient reports no current pain, nothing new to report, has been HEP compliant.   PERTINENT HISTORY:  Cervical fusion C5-7 07/12/2022  PAIN:  Are you having pain? No   PRECAUTIONS: Fall  WEIGHT BEARING RESTRICTIONS: No  FALLS:  Has patient fallen in last 6 months? No  PLOF: Independent  PATIENT GOALS: Improve leg strength and balance   OBJECTIVE:  PATIENT SURVEYS:  FOTO 56% functional status  COGNITION: Overall cognitive status: Within functional limits for tasks assessed  SENSATION: Patient reports bilateral foot and LE numbness from neuropathy  POSTURE:   Rounded shoulders  FLEXIBILITY: Limitations bilateral hamstrings  LOWER EXTREMITY MMT:    MMT Right eval Left eval  Hip flexion 4- 4-  Hip extension 3- 3-  Hip abduction 3 3  Hip adduction    Hip internal rotation    Hip  external rotation    Knee flexion 4+ 4+  Knee extension 4+ 4+  Ankle dorsiflexion    Ankle plantarflexion    Ankle inversion    Ankle eversion     (Blank rows = not tested)  BERG BALANCE TEST Sitting to Standing: 4.      Stands without using hands and stabilize independently Standing Unsupported: 4.      Stands safely for 2 minutes Sitting Unsupported: 4.     Sits for 2 minutes independently Standing to Sitting: 4.     Sits safely with minimal use of hands Transfers: 4.     Transfers safely with minor use of hands Standing with eyes closed: 3.     Stands 10 seconds with supervision Standing with feet together: 3.     Stands for 1 minute with supervision Reaching forward with outstretched arm: 4.     Reaches forward 10 inches Retrieving object from the floor: 4.      Able to pick up easily and safely Turning to look behind: 4.     Looks behind from both sides and weight shifts well Turning 360 degrees: 2.     Able to turn slowly, but safely Place alternate foot on stool: 1.     Completes >2 steps with minimal assist Standing with one foot in front: 0.     Loses balance while standing/stepping Standing on one foot: 0.     Unable Total Score: 41/56  FUNCTIONAL TESTS:  5 times sit to stand: 14 seconds 6 minute walk test: 925 ft using SPC BERG balance test: 41/56  GAIT: Distance walked: 925 ft Assistive device utilized: Single point cane Level of assistance: Modified independence Comments: Unsteady gait   TODAY'S TREATMENT: [facility] Adult PT Treatment:                                                DATE: 09/04/22 Therapeutic Exercise: Bilat heel raises 2x15 Standing rows GTB x15, x15 BlueTB Standing shoulder extension BlueTB 2x15 STS 2 x 10 no UE Bridge 2x10 SLR to fatigue each Rt 15, Lt 16 Side clam shell x 25  Neuromuscular re-ed in // bars: Tandem stance x30" BIL Tandem stance on Airex x30" BIL Tandem walking x2 laps fwd FT on Airex with head turns/nods x30" Hurdle step  overs in // bars step to pattern, step over step - cues for pacing and form, single UE and double UE support (attempted no UE support, increases speed and becomes significantly more  unsteady) Standing on Airex and tapping foot to 6" step 2x10 BIL  [facility] Adult PT Treatment:                                                DATE: 08/28/22 Therapeutic Exercise: Bilat heel raises 2x10 STS x 10 no UE Bridge 2x10 SLR to fatigue each Rt 19, Lt 22 Side clam shell x 25  Standing rows RTB 2x10 Standing shoulder extension RTB 2x10 Neuromuscular re-ed: Tandem stance x30" BIL Tandem stance on Airex x30" BIL Tandem walking at counter x2 laps fwd FT on Airex with head turns/nods x30" Standing on Airex and tapping foot to 6" step 2x10 BIL Obstacle course: cone weaving, 4" step with Airex on top step up and over, hurdle step overs x3 laps (with gait belt, hurdles difficult)  [facility] Adult PT Treatment:                                                DATE: 08/20/22 Therapeutic Exercise: Bilat heel raises x 15  STS x 20 Bridge x 20  SLR to fatigue each Side clam shell x 25  Seated scap retract (W back)  Neuromuscular re-ed: Tandem stance on and off AIREX Narrow stance with head turns, nods , OH reach Tandem gait  Side stepping  SLS 5-6 sec each leg best    PATIENT EDUCATION:  Education details: Exam findings, POC, HEP Person educated: Patient Education method: Explanation, Demonstration, Tactile cues, Verbal cues, and Handouts Education comprehension: verbalized understanding, returned demonstration, verbal cues required, tactile cues required, and needs further education  HOME EXERCISE PROGRAM: Access Code: [email or web address]    ASSESSMENT: CLINICAL IMPRESSION: Patient presents to PT reporting no current pain and HEP compliance. Session today continued to focus on periscapular and BIL LE strengthening as well as balance tasks to decrease fall risk. Increased resistance and repetitions today as noted above  to good effect, no increase in pain throughout session. He has difficulty with hurdle step overs with less UE support, becoming unsteady and increasing speed to complete. Patient was able to tolerate all prescribed exercises with no adverse effects. Patient continues to benefit from skilled PT services and should be progressed as able to improve functional independence.    OBJECTIVE IMPAIRMENTS: Abnormal gait, decreased activity tolerance, decreased balance, decreased strength, impaired flexibility, impaired sensation, and postural dysfunction.   ACTIVITY LIMITATIONS: lifting, standing, squatting, stairs, dressing, and locomotion level  PARTICIPATION LIMITATIONS: meal prep, cleaning, shopping, and community activity  PERSONAL FACTORS: Fitness, Past/current experiences, Time since onset of injury/illness/exacerbation, and 1 comorbidity: see PMH above  are also affecting patient's functional outcome.   REHAB POTENTIAL: Good  CLINICAL DECISION MAKING: Stable/uncomplicated  EVALUATION COMPLEXITY: Low   GOALS: Goals reviewed with patient? Yes  SHORT TERM GOALS: Target date: 09/11/2022  Patient will be I with initial HEP in order to progress with therapy. Baseline: HEP provided at eval Goal status: MET  2.  Patient will perform 5xSTS </= 11 seconds in order to indicate improved LE strength and reduced fall risk Baseline: 14 seconds Goal status: INITIAL  LONG TERM GOALS: Target date: 10/09/2022  Patient will be I with final HEP to maintain progress from PT. Baseline: HEP provided at eval Goal status:  INITIAL  2.  Patient will report >/= 63% status on FOTO to indicate improved functional ability. Baseline: 56% functional status Goal status: INITIAL  3.  Patient will demonstrate gross hip strength >/= 4/5 MMT in order to improve standing and walking stability Baseline: limitations noted above Goal status: INITIAL  4.  Patient will perform >/= 1400 ft with LRAD in order to  improve community access and safety with ambulation Baseline: 925 ft using SPC Goal status: INITIAL  5. Patient will demonstrate BERG balance >/= 48/56 in order to indicate improved balance and reduced fall risk  Baseline: 41/56  Goal status: INITIAL   PLAN: PT FREQUENCY: 1x/week  PT DURATION: 8 weeks  PLANNED INTERVENTIONS: Therapeutic exercises, Therapeutic activity, Neuromuscular re-education, Balance training, Gait training, Patient/Family education, Self Care, Joint mobilization, Aquatic Therapy, Dry Needling, Cryotherapy, Moist heat, Manual therapy, and Re-evaluation  PLAN FOR NEXT SESSION: Review HEP and progress PRN, progress LE strengthening and balance training, incorporate postural strengthening   Berta Minor PTA 09/04/22 12:55 PM Phone: [phone] Fax: [phone]

## 2022-09-09 NOTE — Therapy (Signed)
OUTPATIENT PHYSICAL THERAPY TREATMENT NOTE   Patient Name: Andrew Jones MRN: [id number] DOB:08/06/1940, 82 y.o., male Today's Date: 09/11/2022   END OF SESSION:  PT End of Session - 09/11/22 1145     Visit Number 5    Number of Visits 17    Date for PT Re-Evaluation 10/09/22    Authorization Type [employer] MCR    Progress Note Due on Visit 10    PT Start Time 1145    PT Stop Time 1225    PT Time Calculation (min) 40 min    Activity Tolerance Patient tolerated treatment well    Behavior During Therapy WFL for tasks assessed/performed              Past Medical History:  Diagnosis Date   Anemia due to GI blood loss 08/25/2022   Ankylosing spondylitis (HCC)    back, hips - on embrel 2000-2008, resumed 12/2012   CVA (cerebral vascular accident) (HCC) 1999, 2002   no residual problems   Diabetes mellitus, type 2 (HCC)    Dyslipidemia    Hypertension    Neuropathy    Bilateral Hands/Feet   Osteoarthritis    Seizure disorder (HCC) 05/23/2009   Sleep apnea    uses c-pap   Past Surgical History:  Procedure Laterality Date   ANTERIOR CERVICAL DECOMP/DISCECTOMY FUSION N/A 07/12/2022   Procedure: Anterior Cervical Decompression/Discectomy Fusion Cervical Five-Six, Cervical Six-Seven;  Surgeon: Lisbeth Renshaw, MD;  Location: [facility] OR;  Service: Neurosurgery;  Laterality: N/A;  3C   CATARACT EXTRACTION W/ INTRAOCULAR LENS IMPLANT Bilateral 2022   COLONOSCOPY  2015   INGUINAL HERNIA REPAIR Left 1947   Patient Active Problem List   Diagnosis Date Noted   Abnormal gait 09/03/2022   History of CVA (cerebrovascular accident) without residual deficits 09/03/2022   Long-term use of aspirin therapy 08/25/2022   Spinal stenosis in cervical region 07/12/2022   Iron deficiency anemia 05/06/2022   DM (diabetes mellitus) (HCC) 04/03/2022   Hyperlipidemia associated with type 2 diabetes mellitus (HCC) 04/03/2022   Neuropathy 04/03/2022   Gastroesophageal reflux disease without  esophagitis 04/03/2022   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) 07/25/2021   Vitamin D deficiency 07/25/2021   Obesity 07/03/2020   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   Low magnesium level 04/29/2019   Seborrheic keratosis 05/15/2018   Hearing loss 12/01/2017   Benign prostatic hyperplasia 12/20/2014   Ankylosing spondylitis of unspecified sites in spine (HCC) 12/20/2014   Encounter for therapeutic drug monitoring 12/08/2012   Seizure disorder (HCC)    Hypertension    Osteoarthritis     PCP: Anne Ng, NP  REFERRING PROVIDER: Lisbeth Renshaw, MD  REFERRING DIAG: Spinal stenosis, cervical region  THERAPY DIAG:  Cervicalgia  Muscle weakness (generalized)  Rationale for Evaluation and Treatment: Rehabilitation  ONSET DATE: 07/12/2022   SUBJECTIVE:  SUBJECTIVE STATEMENT: Patient reports he can feel like his legs are getting stronger and his balance is way better. He is no longer using a cane for walking. He has noticed some low back stiffness but no pain.   PERTINENT HISTORY:  Cervical fusion C5-7 07/12/2022  PAIN:  Are you having pain? No   PRECAUTIONS: Fall  WEIGHT BEARING RESTRICTIONS: No  PATIENT GOALS: Improve leg strength and balance   OBJECTIVE:  PATIENT SURVEYS:  FOTO 56% functional status  09/11/2022: 63%  SENSATION: Patient reports bilateral foot and LE numbness from neuropathy  POSTURE:   Rounded shoulders  FLEXIBILITY: Limitations bilateral hamstrings  LOWER EXTREMITY MMT:    MMT Right eval Left eval Rt / Lt 09/11/2022  Hip flexion 4- 4-   Hip extension 3- 3- 3+ / 3+  Hip abduction 3 3 3+ / 3+  Hip adduction     Hip internal rotation     Hip  external rotation     Knee flexion 4+ 4+   Knee extension 4+ 4+   Ankle dorsiflexion     Ankle plantarflexion     Ankle inversion     Ankle eversion      (Blank rows = not tested)  BERG BALANCE TEST Sitting to Standing: 4.      Stands without using hands and stabilize independently Standing Unsupported: 4.      Stands safely for 2 minutes Sitting Unsupported: 4.     Sits for 2 minutes independently Standing to Sitting: 4.     Sits safely with minimal use of hands Transfers: 4.     Transfers safely with minor use of hands Standing with eyes closed: 3.     Stands 10 seconds with supervision Standing with feet together: 3.     Stands for 1 minute with supervision Reaching forward with outstretched arm: 4.     Reaches forward 10 inches Retrieving object from the floor: 4.      Able to pick up easily and safely Turning to look behind: 4.     Looks behind from both sides and weight shifts well Turning 360 degrees: 2.     Able to turn slowly, but safely Place alternate foot on stool: 1.     Completes >2 steps with minimal assist Standing with one foot in front: 0.     Loses balance while standing/stepping Standing on one foot: 0.     Unable Total Score: 41/56  FUNCTIONAL TESTS:  5 times sit to stand: 14 seconds  09/11/2022: 10 seconds 6 minute walk test: 925 ft using SPC BERG balance test: 41/56  GAIT: Distance walked: 925 ft Assistive device utilized: Single point cane Level of assistance: Modified independence Comments: Unsteady gait   TODAY'S TREATMENT: [facility] Adult PT Treatment:                                                DATE: 09/11/22 Therapeutic Exercise: NuStep L6 x 5 min with LE while taking subjective Seated hamstring stretch 2 x 30 sec each LTR 10 x 5 sec Bridge x 10 SLR x 15 each Sidelying hip abduction x 15 each LAQ with 3# 2 x 15 each Standing heel raises x 20 Neuromuscular re-ed in // bars: Tandem stance 2 x 30 sec each Rockerboard forward/backward and  lateral taps 2 x 10 each Romberg on Airex with  trunk rotations holding yellow med ball x 10 Romberg on Airex with yellow med ball lifts x 10 Romberg on Airex with forward cone tap x 10 each hand   [facility] Adult PT Treatment:                                                DATE: 09/04/22 Therapeutic Exercise: Bilat heel raises 2x15 Standing rows GTB x15, x15 BlueTB Standing shoulder extension BlueTB 2x15 STS 2 x 10 no UE Bridge 2x10 SLR to fatigue each Rt 15, Lt 16 Side clam shell x 25  Neuromuscular re-ed in // bars: Tandem stance x30" BIL Tandem stance on Airex x30" BIL Tandem walking x2 laps fwd FT on Airex with head turns/nods x30" Hurdle step overs in // bars step to pattern, step over step - cues for pacing and form, single UE and double UE support (attempted no UE support, increases speed and becomes significantly more unsteady) Standing on Airex and tapping foot to 6" step 2x10 BIL  [facility] Adult PT Treatment:                                                DATE: 08/28/22 Therapeutic Exercise: Bilat heel raises 2x10 STS x 10 no UE Bridge 2x10 SLR to fatigue each Rt 19, Lt 22 Side clam shell x 25  Standing rows RTB 2x10 Standing shoulder extension RTB 2x10 Neuromuscular re-ed: Tandem stance x30" BIL Tandem stance on Airex x30" BIL Tandem walking at counter x2 laps fwd FT on Airex with head turns/nods x30" Standing on Airex and tapping foot to 6" step 2x10 BIL Obstacle course: cone weaving, 4" step with Airex on top step up and over, hurdle step overs x3 laps (with gait belt, hurdles difficult)  PATIENT EDUCATION:  Education details: HEP update Person educated: Patient Education method: Explanation, Demonstration, Tactile cues, Verbal cues, and Handouts Education comprehension: verbalized understanding, returned demonstration, verbal cues required, tactile cues required, and needs further education  HOME EXERCISE PROGRAM: Access Code: [email or web address]    ASSESSMENT: CLINICAL  IMPRESSION: Patient tolerated therapy well with no adverse effects. He demonstrates improvement in his 5xSTS indicating improved LE strength and reduced fall risk, and reports improvement in his functional ability on FOTO achieving both goals. He does continue to exhibit gross hip strength and balance deficits. Therapy focused on progressing his strengthening and balance with good tolerance. Updated his HEP to incorporate some stretching to alleviate low back stiffness. Patient will benefit from continued skilled PT to progress his strength and balance to maximize functional ability.   OBJECTIVE IMPAIRMENTS: Abnormal gait, decreased activity tolerance, decreased balance, decreased strength, impaired flexibility, impaired sensation, and postural dysfunction.   ACTIVITY LIMITATIONS: lifting, standing, squatting, stairs, dressing, and locomotion level  PARTICIPATION LIMITATIONS: meal prep, cleaning, shopping, and community activity  PERSONAL FACTORS: Fitness, Past/current experiences, Time since onset of injury/illness/exacerbation, and 1 comorbidity: see PMH above  are also affecting patient's functional outcome.    GOALS: Goals reviewed with patient? Yes  SHORT TERM GOALS: Target date: 09/11/2022  Patient will be I with initial HEP in order to progress with therapy. Baseline: HEP provided at eval Goal status: MET  2.  Patient will perform 5xSTS </= 11 seconds in order to  indicate improved LE strength and reduced fall risk Baseline: 14 seconds 09/11/2022: 10 seconds Goal status: MET  LONG TERM GOALS: Target date: 10/09/2022  Patient will be I with final HEP to maintain progress from PT. Baseline: HEP provided at eval Goal status: INITIAL  2.  Patient will report >/= 63% status on FOTO to indicate improved functional ability. Baseline: 56% functional status 09/11/2022: 63% Goal status: MET  3.  Patient will demonstrate gross hip strength >/= 4/5 MMT in order to improve standing and  walking stability Baseline: limitations noted above Goal status: INITIAL  4.  Patient will perform >/= 1400 ft with LRAD in order to improve community access and safety with ambulation Baseline: 925 ft using SPC Goal status: INITIAL  5. Patient will demonstrate BERG balance >/= 48/56 in order to indicate improved balance and reduced fall risk  Baseline: 41/56  Goal status: INITIAL   PLAN: PT FREQUENCY: 1x/week  PT DURATION: 8 weeks  PLANNED INTERVENTIONS: Therapeutic exercises, Therapeutic activity, Neuromuscular re-education, Balance training, Gait training, Patient/Family education, Self Care, Joint mobilization, Aquatic Therapy, Dry Needling, Cryotherapy, Moist heat, Manual therapy, and Re-evaluation  PLAN FOR NEXT SESSION: Review HEP and progress PRN, progress LE strengthening and balance training, incorporate postural strengthening   Hilbert Bible PT 09/11/22 12:36 PM Phone: [phone] Fax: [phone]

## 2022-09-11 ENCOUNTER — Ambulatory Visit: Payer: Medicare Other | Admitting: Physical Therapy

## 2022-09-11 ENCOUNTER — Other Ambulatory Visit: Payer: Self-pay

## 2022-09-11 ENCOUNTER — Encounter: Payer: Self-pay | Admitting: Physical Therapy

## 2022-09-11 DIAGNOSIS — M6281 Muscle weakness (generalized): Secondary | ICD-10-CM

## 2022-09-11 DIAGNOSIS — M542 Cervicalgia: Secondary | ICD-10-CM | POA: Diagnosis not present

## 2022-09-11 NOTE — Patient Instructions (Signed)
Access Code: [email or web address] URL: [email or web address] Date: 09/11/2022 Prepared by: Rosana Hoes  Exercises - Supine Lower Trunk Rotation  - 1 x daily - 10 reps - 5 seconds hold - Active Straight Leg Raise with Quad Set  - 1 x daily - 2 sets - 10 reps - Bridge  - 1 x daily - 2 sets - 10 reps - Clamshell  - 1 x daily - 2 sets - 20 reps - Sit to Stand Without Arm Support  - 1 x daily - 3 sets - 10 reps - Heel Raises with Counter Support  - 1 x daily - 2 sets - 20 reps - Standing Tandem Balance with Counter Support  - 1 x daily - 3 reps - 30 seconds hold - Seated Hamstring Stretch  - 1 x daily - 3 reps - 20 seconds hold

## 2022-09-17 ENCOUNTER — Encounter: Payer: Self-pay | Admitting: Physical Therapy

## 2022-09-17 ENCOUNTER — Ambulatory Visit: Payer: Medicare Other | Admitting: Physical Therapy

## 2022-09-17 ENCOUNTER — Other Ambulatory Visit: Payer: Self-pay

## 2022-09-17 DIAGNOSIS — M6281 Muscle weakness (generalized): Secondary | ICD-10-CM | POA: Diagnosis not present

## 2022-09-17 DIAGNOSIS — M542 Cervicalgia: Secondary | ICD-10-CM

## 2022-09-17 NOTE — Therapy (Signed)
OUTPATIENT PHYSICAL THERAPY TREATMENT NOTE   Patient Name: Andrew Jones MRN: [id number] DOB:[birth year], 82 y.o., male Today's Date: 09/17/2022   END OF SESSION:  PT End of Session - 09/17/22 1318     Visit Number 6    Number of Visits 17    Date for PT Re-Evaluation 10/09/22    Authorization Type [employer] MCR    Progress Note Due on Visit 10    PT Start Time 1315    PT Stop Time 1355    PT Time Calculation (min) 40 min    Activity Tolerance Patient tolerated treatment well    Behavior During Therapy WFL for tasks assessed/performed               Past Medical History:  Diagnosis Date   Anemia due to GI blood loss 08/25/2022   Ankylosing spondylitis (HCC)    back, hips - on embrel 2000-2008, resumed 12/2012   CVA (cerebral vascular accident) (HCC) 1999, 2002   no residual problems   Diabetes mellitus, type 2 (HCC)    Dyslipidemia    Hypertension    Neuropathy    Bilateral Hands/Feet   Osteoarthritis    Seizure disorder (HCC) 05/23/2009   Sleep apnea    uses c-pap   Past Surgical History:  Procedure Laterality Date   ANTERIOR CERVICAL DECOMP/DISCECTOMY FUSION N/A 07/12/2022   Procedure: Anterior Cervical Decompression/Discectomy Fusion Cervical Five-Six, Cervical Six-Seven;  Surgeon: Lisbeth Renshaw, MD;  Location: [facility] OR;  Service: Neurosurgery;  Laterality: N/A;  3C   CATARACT EXTRACTION W/ INTRAOCULAR LENS IMPLANT Bilateral 2022   COLONOSCOPY  2015   INGUINAL HERNIA REPAIR Left 1947   Patient Active Problem List   Diagnosis Date Noted   Abnormal gait 09/03/2022   History of CVA (cerebrovascular accident) without residual deficits 09/03/2022   Long-term use of aspirin therapy 08/25/2022   Spinal stenosis in cervical region 07/12/2022   Iron deficiency anemia 05/06/2022   DM (diabetes mellitus) (HCC) 04/03/2022   Hyperlipidemia associated with type 2 diabetes mellitus (HCC) 04/03/2022   Neuropathy 04/03/2022   Gastroesophageal reflux disease without  esophagitis 04/03/2022   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) 07/25/2021   Vitamin D deficiency 07/25/2021   Obesity 07/03/2020   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   Low magnesium level 04/29/2019   Seborrheic keratosis 05/15/2018   Hearing loss 12/01/2017   Benign prostatic hyperplasia 12/20/2014   Ankylosing spondylitis of unspecified sites in spine (HCC) 12/20/2014   Encounter for therapeutic drug monitoring 12/08/2012   Seizure disorder (HCC)    Hypertension    Osteoarthritis     PCP: Anne Ng, NP  REFERRING PROVIDER: Lisbeth Renshaw, MD  REFERRING DIAG: Spinal stenosis, cervical region  THERAPY DIAG:  Muscle weakness (generalized)  Cervicalgia  Rationale for Evaluation and Treatment: Rehabilitation  ONSET DATE: 07/12/2022   SUBJECTIVE:  SUBJECTIVE STATEMENT: Patient reports the stretches from last visit are helpful for his back.   PERTINENT HISTORY:  Cervical fusion C5-7 07/12/2022  PAIN:  Are you having pain? No   PRECAUTIONS: Fall  WEIGHT BEARING RESTRICTIONS: No  PATIENT GOALS: Improve leg strength and balance   OBJECTIVE:  PATIENT SURVEYS:  FOTO 56% functional status  09/11/2022: 63%  SENSATION: Patient reports bilateral foot and LE numbness from neuropathy  POSTURE:   Rounded shoulders  FLEXIBILITY: Limitations bilateral hamstrings  LOWER EXTREMITY MMT:    MMT Right eval Left eval Rt / Lt 09/11/2022  Hip flexion 4- 4-   Hip extension 3- 3- 3+ / 3+  Hip abduction 3 3 3+ / 3+  Hip adduction     Hip internal rotation     Hip external rotation     Knee flexion 4+ 4+   Knee extension 4+ 4+   Ankle dorsiflexion     Ankle plantarflexion      Ankle inversion     Ankle eversion      (Blank rows = not tested)  BERG BALANCE TEST Sitting to Standing: 4.      Stands without using hands and stabilize independently Standing Unsupported: 4.      Stands safely for 2 minutes Sitting Unsupported: 4.     Sits for 2 minutes independently Standing to Sitting: 4.     Sits safely with minimal use of hands Transfers: 4.     Transfers safely with minor use of hands Standing with eyes closed: 3.     Stands 10 seconds with supervision Standing with feet together: 3.     Stands for 1 minute with supervision Reaching forward with outstretched arm: 4.     Reaches forward 10 inches Retrieving object from the floor: 4.      Able to pick up easily and safely Turning to look behind: 4.     Looks behind from both sides and weight shifts well Turning 360 degrees: 2.     Able to turn slowly, but safely Place alternate foot on stool: 1.     Completes >2 steps with minimal assist Standing with one foot in front: 0.     Loses balance while standing/stepping Standing on one foot: 0.     Unable Total Score: 41/56  FUNCTIONAL TESTS:  5 times sit to stand: 14 seconds  09/11/2022: 10 seconds 6 minute walk test: 925 ft using SPC BERG balance test: 41/56  GAIT: Distance walked: 925 ft Assistive device utilized: Single point cane Level of assistance: Modified independence Comments: Unsteady gait   TODAY'S TREATMENT: [facility] Adult PT Treatment:                                                DATE: 09/17/22 Therapeutic Exercise: NuStep L6 x 5 min with LE while taking subjective Standing hip abduction with yellow at knees 2 x 15 each Standing hip extension with yellow at knees x 15 each Forward 6" step-up x 10 each, 8" step-up x 10 each Standing heel raises x 20 Sit to stand holding 10# at chest 2 x 10 LAQ with 4# 2 x 15 each Seated hamstring curl with yellow 2 x 15 each Neuromuscular re-ed in // bars: Tandem stance x 30 sec each Romberg on Airex with  trunk rotations holding yellow med ball x 10 each  Romberg on Airex with 8" step tap x 10 each    [facility] Adult PT Treatment:                                                DATE: 09/11/22 Therapeutic Exercise: NuStep L6 x 5 min with LE while taking subjective Seated hamstring stretch 2 x 30 sec each LTR 10 x 5 sec Bridge x 10 SLR x 15 each Sidelying hip abduction x 15 each LAQ with 3# 2 x 15 each Standing heel raises x 20 Neuromuscular re-ed in // bars: Tandem stance 2 x 30 sec each Rockerboard forward/backward and lateral taps 2 x 10 each Romberg on Airex with trunk rotations holding yellow med ball x 10 Romberg on Airex with yellow med ball lifts x 10 Romberg on Airex with forward cone tap x 10 each hand  [facility] Adult PT Treatment:                                                DATE: 09/04/22 Therapeutic Exercise: Bilat heel raises 2x15 Standing rows GTB x15, x15 BlueTB Standing shoulder extension BlueTB 2x15 STS 2 x 10 no UE Bridge 2x10 SLR to fatigue each Rt 15, Lt 16 Side clam shell x 25  Neuromuscular re-ed in // bars: Tandem stance x30" BIL Tandem stance on Airex x30" BIL Tandem walking x2 laps fwd FT on Airex with head turns/nods x30" Hurdle step overs in // bars step to pattern, step over step - cues for pacing and form, single UE and double UE support (attempted no UE support, increases speed and becomes significantly more unsteady) Standing on Airex and tapping foot to 6" step 2x10 BIL  PATIENT EDUCATION:  Education details: HEP update Person educated: Patient Education method: Explanation, Demonstration, Tactile cues, Verbal cues, and Handouts Education comprehension: verbalized understanding, returned demonstration, verbal cues required, tactile cues required, and needs further education  HOME EXERCISE PROGRAM: Access Code: [email or web address]    ASSESSMENT: CLINICAL IMPRESSION: Patient tolerated therapy well with no adverse effects. Therapy focused on continued  progressing of LE strength, endurance and balance with good tolerance. He did well with more standing strengthening this visit and seems to be progressing well with standing stability and balance. No changes were made to his HEP this visit. Patient will benefit from continued skilled PT to progress his strength and balance to maximize functional ability.   OBJECTIVE IMPAIRMENTS: Abnormal gait, decreased activity tolerance, decreased balance, decreased strength, impaired flexibility, impaired sensation, and postural dysfunction.   ACTIVITY LIMITATIONS: lifting, standing, squatting, stairs, dressing, and locomotion level  PARTICIPATION LIMITATIONS: meal prep, cleaning, shopping, and community activity  PERSONAL FACTORS: Fitness, Past/current experiences, Time since onset of injury/illness/exacerbation, and 1 comorbidity: see PMH above  are also affecting patient's functional outcome.    GOALS: Goals reviewed with patient? Yes  SHORT TERM GOALS: Target date: 09/11/2022  Patient will be I with initial HEP in order to progress with therapy. Baseline: HEP provided at eval Goal status: MET  2.  Patient will perform 5xSTS </= 11 seconds in order to indicate improved LE strength and reduced fall risk Baseline: 14 seconds 09/11/2022: 10 seconds Goal status: MET  LONG TERM GOALS: Target date: 10/09/2022  Patient will be  I with final HEP to maintain progress from PT. Baseline: HEP provided at eval Goal status: INITIAL  2.  Patient will report >/= 63% status on FOTO to indicate improved functional ability. Baseline: 56% functional status 09/11/2022: 63% Goal status: MET  3.  Patient will demonstrate gross hip strength >/= 4/5 MMT in order to improve standing and walking stability Baseline: limitations noted above Goal status: INITIAL  4.  Patient will perform >/= 1400 ft with LRAD in order to improve community access and safety with ambulation Baseline: 925 ft using SPC Goal status:  INITIAL  5. Patient will demonstrate BERG balance >/= 48/56 in order to indicate improved balance and reduced fall risk  Baseline: 41/56  Goal status: INITIAL   PLAN: PT FREQUENCY: 1x/week  PT DURATION: 8 weeks  PLANNED INTERVENTIONS: Therapeutic exercises, Therapeutic activity, Neuromuscular re-education, Balance training, Gait training, Patient/Family education, Self Care, Joint mobilization, Aquatic Therapy, Dry Needling, Cryotherapy, Moist heat, Manual therapy, and Re-evaluation  PLAN FOR NEXT SESSION: Review HEP and progress PRN, progress LE strengthening and balance training, incorporate postural strengthening   Hilbert Bible PT 09/17/22 1:57 PM Phone: [phone] Fax: [phone]

## 2022-09-24 ENCOUNTER — Encounter: Payer: Self-pay | Admitting: Physical Therapy

## 2022-09-24 ENCOUNTER — Ambulatory Visit: Payer: Medicare Other | Attending: Neurosurgery | Admitting: Physical Therapy

## 2022-09-24 ENCOUNTER — Other Ambulatory Visit: Payer: Self-pay

## 2022-09-24 DIAGNOSIS — M542 Cervicalgia: Secondary | ICD-10-CM | POA: Insufficient documentation

## 2022-09-24 DIAGNOSIS — M6281 Muscle weakness (generalized): Secondary | ICD-10-CM | POA: Diagnosis not present

## 2022-09-24 NOTE — Therapy (Signed)
OUTPATIENT PHYSICAL THERAPY TREATMENT NOTE   Patient Name: Andrew Jones MRN: [id number] DOB:12-25-1940, 82 y.o., male Today's Date: 09/24/2022   END OF SESSION:  PT End of Session - 09/24/22 1108     Visit Number 7    Number of Visits 17    Date for PT Re-Evaluation 10/09/22    Authorization Type BCBS MCR    Progress Note Due on Visit 10    PT Start Time 1100    PT Stop Time 1140    PT Time Calculation (min) 40 min    Activity Tolerance Patient tolerated treatment well    Behavior During Therapy WFL for tasks assessed/performed                Past Medical History:  Diagnosis Date   Anemia due to GI blood loss 08/25/2022   Ankylosing spondylitis (HCC)    back, hips - on embrel 2000-2008, resumed 12/2012   CVA (cerebral vascular accident) (HCC) 1999, 2002   no residual problems   Diabetes mellitus, type 2 (HCC)    Dyslipidemia    Hypertension    Neuropathy    Bilateral Hands/Feet   Osteoarthritis    Seizure disorder (HCC) 05/23/2009   Sleep apnea    uses c-pap   Past Surgical History:  Procedure Laterality Date   ANTERIOR CERVICAL DECOMP/DISCECTOMY FUSION N/A 07/12/2022   Procedure: Anterior Cervical Decompression/Discectomy Fusion Cervical Five-Six, Cervical Six-Seven;  Surgeon: Lisbeth Renshaw, MD;  Location: [facility] OR;  Service: Neurosurgery;  Laterality: N/A;  3C   CATARACT EXTRACTION W/ INTRAOCULAR LENS IMPLANT Bilateral 2022   COLONOSCOPY  2015   INGUINAL HERNIA REPAIR Left 1947   Patient Active Problem List   Diagnosis Date Noted   Abnormal gait 09/03/2022   History of CVA (cerebrovascular accident) without residual deficits 09/03/2022   Long-term use of aspirin therapy 08/25/2022   Spinal stenosis in cervical region 07/12/2022   Iron deficiency anemia 05/06/2022   DM (diabetes mellitus) (HCC) 04/03/2022   Hyperlipidemia associated with type 2 diabetes mellitus (HCC) 04/03/2022   Neuropathy 04/03/2022   Gastroesophageal reflux disease without  esophagitis 04/03/2022   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) 07/25/2021   Vitamin D deficiency 07/25/2021   Obesity 07/03/2020   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   Low magnesium level 04/29/2019   Seborrheic keratosis 05/15/2018   Hearing loss 12/01/2017   Benign prostatic hyperplasia 12/20/2014   Ankylosing spondylitis of unspecified sites in spine (HCC) 12/20/2014   Encounter for therapeutic drug monitoring 12/08/2012   Seizure disorder (HCC)    Hypertension    Osteoarthritis     PCP: Anne Ng, NP  REFERRING PROVIDER: Lisbeth Renshaw, MD  REFERRING DIAG: Spinal stenosis, cervical region  THERAPY DIAG:  Muscle weakness (generalized)  Cervicalgia  Rationale for Evaluation and Treatment: Rehabilitation  ONSET DATE: 07/12/2022   SUBJECTIVE:  SUBJECTIVE STATEMENT: Patient reports he is doing well, no new issues.   PERTINENT HISTORY:  Cervical fusion C5-7 07/12/2022  PAIN:  Are you having pain? No   PRECAUTIONS: Fall  WEIGHT BEARING RESTRICTIONS: No  PATIENT GOALS: Improve leg strength and balance   OBJECTIVE:  PATIENT SURVEYS:  FOTO 56% functional status  09/11/2022: 63%  SENSATION: Patient reports bilateral foot and LE numbness from neuropathy  POSTURE:   Rounded shoulders  FLEXIBILITY: Limitations bilateral hamstrings  LOWER EXTREMITY MMT:    MMT Right eval Left eval Rt / Lt 09/11/2022 Rt / Lt 09/24/2022  Hip flexion 4- 4-    Hip extension 3- 3- 3+ / 3+   Hip abduction 3 3 3+ / 3+ 3+ / 3+  Hip adduction      Hip internal rotation      Hip external rotation      Knee flexion 4+ 4+    Knee extension 4+ 4+  4+ / 4+  Ankle dorsiflexion      Ankle  plantarflexion      Ankle inversion      Ankle eversion       (Blank rows = not tested)  BERG BALANCE TEST Sitting to Standing: 4.      Stands without using hands and stabilize independently Standing Unsupported: 4.      Stands safely for 2 minutes Sitting Unsupported: 4.     Sits for 2 minutes independently Standing to Sitting: 4.     Sits safely with minimal use of hands Transfers: 4.     Transfers safely with minor use of hands Standing with eyes closed: 3.     Stands 10 seconds with supervision Standing with feet together: 3.     Stands for 1 minute with supervision Reaching forward with outstretched arm: 4.     Reaches forward 10 inches Retrieving object from the floor: 4.      Able to pick up easily and safely Turning to look behind: 4.     Looks behind from both sides and weight shifts well Turning 360 degrees: 2.     Able to turn slowly, but safely Place alternate foot on stool: 1.     Completes >2 steps with minimal assist Standing with one foot in front: 0.     Loses balance while standing/stepping Standing on one foot: 0.     Unable Total Score: 41/56  FUNCTIONAL TESTS:  5 times sit to stand: 14 seconds  09/11/2022: 10 seconds 6 minute walk test: 925 ft using SPC BERG balance test: 41/56  GAIT: Distance walked: 925 ft Assistive device utilized: Single point cane Level of assistance: Modified independence Comments: Unsteady gait   TODAY'S TREATMENT: [facility] Adult PT Treatment:                                                DATE: 09/24/22 Therapeutic Exercise: NuStep L6 x 5 min with LE while taking subjective Standing hip abduction with red at knees 3 x 10 each Standing heel raises 2 x 15 Forward 8" step-up 2 x 10 each Bridge 2 x 10 SLR 2 x 15 each Side clamshell with red 2 x 15 each LAQ with 5# 3 x 10 each Sit to stand holding 10# at chest 2 x 10 Row with green 2 x 15    [facility] Adult PT Treatment:  DATE:  09/17/22 Therapeutic Exercise: NuStep L6 x 5 min with LE while taking subjective Standing hip abduction with yellow at knees 2 x 15 each Standing hip extension with yellow at knees x 15 each Forward 6" step-up x 10 each, 8" step-up x 10 each Standing heel raises x 20 Sit to stand holding 10# at chest 2 x 10 LAQ with 4# 2 x 15 each Seated hamstring curl with yellow 2 x 15 each Neuromuscular re-ed in // bars: Tandem stance x 30 sec each Romberg on Airex with trunk rotations holding yellow med ball x 10 each Romberg on Airex with 8" step tap x 10 each   [facility] Adult PT Treatment:                                                DATE: 09/11/22 Therapeutic Exercise: NuStep L6 x 5 min with LE while taking subjective Seated hamstring stretch 2 x 30 sec each LTR 10 x 5 sec Bridge x 10 SLR x 15 each Sidelying hip abduction x 15 each LAQ with 3# 2 x 15 each Standing heel raises x 20 Neuromuscular re-ed in // bars: Tandem stance 2 x 30 sec each Rockerboard forward/backward and lateral taps 2 x 10 each Romberg on Airex with trunk rotations holding yellow med ball x 10 Romberg on Airex with yellow med ball lifts x 10 Romberg on Airex with forward cone tap x 10 each hand  [facility] Adult PT Treatment:                                                DATE: 09/04/22 Therapeutic Exercise: Bilat heel raises 2x15 Standing rows GTB x15, x15 BlueTB Standing shoulder extension BlueTB 2x15 STS 2 x 10 no UE Bridge 2x10 SLR to fatigue each Rt 15, Lt 16 Side clam shell x 25  Neuromuscular re-ed in // bars: Tandem stance x30" BIL Tandem stance on Airex x30" BIL Tandem walking x2 laps fwd FT on Airex with head turns/nods x30" Hurdle step overs in // bars step to pattern, step over step - cues for pacing and form, single UE and double UE support (attempted no UE support, increases speed and becomes significantly more unsteady) Standing on Airex and tapping foot to 6" step 2x10 BIL  PATIENT EDUCATION:   Education details: HEP Person educated: Patient Education method: Explanation, Demonstration, Actor cues, Verbal cues Education comprehension: verbalized understanding, returned demonstration, verbal cues required, tactile cues required, and needs further education  HOME EXERCISE PROGRAM: Access Code: [email or web address]    ASSESSMENT: CLINICAL IMPRESSION: Patient tolerated therapy well with no adverse effects. Therapy focused primarily on LE strengthening this visit with good tolerance. He did report quad fatigue following step-ups and increased weight with LAQ. He does continue to exhibit gross strength deficits of the hips and knees but seems to be progressing with his strengthening exercises with increased weight and resistance. He does continue to exhibit and occasional unsteady gait with increased fatigue. No changes to HEP this visit. Patient will benefit from continued skilled PT to progress his strength and balance to maximize functional ability.   OBJECTIVE IMPAIRMENTS: Abnormal gait, decreased activity tolerance, decreased balance, decreased strength, impaired flexibility, impaired sensation, and postural dysfunction.  ACTIVITY LIMITATIONS: lifting, standing, squatting, stairs, dressing, and locomotion level  PARTICIPATION LIMITATIONS: meal prep, cleaning, shopping, and community activity  PERSONAL FACTORS: Fitness, Past/current experiences, Time since onset of injury/illness/exacerbation, and 1 comorbidity: see PMH above  are also affecting patient's functional outcome.    GOALS: Goals reviewed with patient? Yes  SHORT TERM GOALS: Target date: 09/11/2022  Patient will be I with initial HEP in order to progress with therapy. Baseline: HEP provided at eval Goal status: MET  2.  Patient will perform 5xSTS </= 11 seconds in order to indicate improved LE strength and reduced fall risk Baseline: 14 seconds 09/11/2022: 10 seconds Goal status: MET  LONG TERM GOALS: Target date:  10/09/2022  Patient will be I with final HEP to maintain progress from PT. Baseline: HEP provided at eval Goal status: INITIAL  2.  Patient will report >/= 63% status on FOTO to indicate improved functional ability. Baseline: 56% functional status 09/11/2022: 63% Goal status: MET  3.  Patient will demonstrate gross hip strength >/= 4/5 MMT in order to improve standing and walking stability Baseline: limitations noted above Goal status: INITIAL  4.  Patient will perform >/= 1400 ft with LRAD in order to improve community access and safety with ambulation Baseline: 925 ft using SPC Goal status: INITIAL  5. Patient will demonstrate BERG balance >/= 48/56 in order to indicate improved balance and reduced fall risk  Baseline: 41/56  Goal status: INITIAL   PLAN: PT FREQUENCY: 1x/week  PT DURATION: 8 weeks  PLANNED INTERVENTIONS: Therapeutic exercises, Therapeutic activity, Neuromuscular re-education, Balance training, Gait training, Patient/Family education, Self Care, Joint mobilization, Aquatic Therapy, Dry Needling, Cryotherapy, Moist heat, Manual therapy, and Re-evaluation  PLAN FOR NEXT SESSION: Review HEP and progress PRN, progress LE strengthening and balance training, incorporate postural strengthening   Hilbert Bible PT 09/24/22 11:44 AM Phone: [phone] Fax: [phone]

## 2022-09-25 ENCOUNTER — Encounter: Payer: Self-pay | Admitting: Oncology

## 2022-10-01 NOTE — Therapy (Signed)
OUTPATIENT PHYSICAL THERAPY TREATMENT NOTE  Progress Note Reporting Period 08/14/2022 to 10/02/2022  See note below for Objective Data and Assessment of Progress/Goals.     Patient Name: Andrew Jones MRN: [id number] DOB:1940/08/20, 82 y.o., male Today's Date: 10/02/2022   END OF SESSION:  PT End of Session - 10/02/22 1149     Visit Number 8    Number of Visits 14    Date for PT Re-Evaluation 11/13/22    Authorization Type BCBS MCR    Progress Note Due on Visit 18    PT Start Time 1145    PT Stop Time 1225    PT Time Calculation (min) 40 min    Equipment Utilized During Treatment Gait belt    Activity Tolerance Patient tolerated treatment well    Behavior During Therapy WFL for tasks assessed/performed                 Past Medical History:  Diagnosis Date   Anemia due to GI blood loss 08/25/2022   Ankylosing spondylitis (HCC)    back, hips - on embrel 2000-2008, resumed 12/2012   CVA (cerebral vascular accident) (HCC) 1999, 2002   no residual problems   Diabetes mellitus, type 2 (HCC)    Dyslipidemia    Hypertension    Neuropathy    Bilateral Hands/Feet   Osteoarthritis    Seizure disorder (HCC) 05/23/2009   Sleep apnea    uses c-pap   Past Surgical History:  Procedure Laterality Date   ANTERIOR CERVICAL DECOMP/DISCECTOMY FUSION N/A 07/12/2022   Procedure: Anterior Cervical Decompression/Discectomy Fusion Cervical Five-Six, Cervical Six-Seven;  Surgeon: Lisbeth Renshaw, MD;  Location: [facility] OR;  Service: Neurosurgery;  Laterality: N/A;  3C   CATARACT EXTRACTION W/ INTRAOCULAR LENS IMPLANT Bilateral 2022   COLONOSCOPY  2015   INGUINAL HERNIA REPAIR Left 1947   Patient Active Problem List   Diagnosis Date Noted   Abnormal gait 09/03/2022   History of CVA (cerebrovascular accident) without residual deficits 09/03/2022   Long-term use of aspirin therapy 08/25/2022   Spinal stenosis in cervical region 07/12/2022   Iron deficiency anemia 05/06/2022   DM  (diabetes mellitus) (HCC) 04/03/2022   Hyperlipidemia associated with type 2 diabetes mellitus (HCC) 04/03/2022   Neuropathy 04/03/2022   Gastroesophageal reflux disease without esophagitis 04/03/2022   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) 07/25/2021   Vitamin D deficiency 07/25/2021   Obesity 07/03/2020   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   Low magnesium level 04/29/2019   Seborrheic keratosis 05/15/2018   Hearing loss 12/01/2017   Benign prostatic hyperplasia 12/20/2014   Ankylosing spondylitis of unspecified sites in spine (HCC) 12/20/2014   Encounter for therapeutic drug monitoring 12/08/2012   Seizure disorder (HCC)    Hypertension    Osteoarthritis     PCP: Anne Ng, NP  REFERRING PROVIDER: Lisbeth Renshaw, MD  REFERRING DIAG: Spinal stenosis, cervical region  THERAPY DIAG:  Muscle weakness (generalized)  Cervicalgia  Rationale for Evaluation and Treatment: Rehabilitation  ONSET DATE: 07/12/2022   SUBJECTIVE:  SUBJECTIVE STATEMENT: Patient reports he is doing well. He has been walking with ski poles that seems to help, about 20 minutes.   PERTINENT HISTORY:  Cervical fusion C5-7 07/12/2022  PAIN:  Are you having pain? No   PRECAUTIONS: Fall  WEIGHT BEARING RESTRICTIONS: No  PATIENT GOALS: Improve leg strength and balance   OBJECTIVE:  PATIENT SURVEYS:  FOTO 56% functional status  09/11/2022: 63%  SENSATION: Patient reports bilateral foot and LE numbness from neuropathy  POSTURE:   Rounded shoulders  FLEXIBILITY: Limitations bilateral hamstrings  LOWER EXTREMITY MMT:    MMT Right eval Left eval Rt / Lt 09/11/2022 Rt / Lt 09/24/2022 Rt /  Lt 10/02/2022  Hip flexion 4- 4-     Hip extension 3- 3- 3+ / 3+  4- / 4-  Hip abduction 3 3 3+ / 3+ 3+ / 3+ 4- / 4-  Hip adduction       Hip internal rotation       Hip external rotation       Knee flexion 4+ 4+     Knee extension 4+ 4+  4+ / 4+ 5 / 5  Ankle dorsiflexion       Ankle plantarflexion       Ankle inversion       Ankle eversion        (Blank rows = not tested)  BERG BALANCE TEST - 10/02/2022 Sitting to Standing: 4.      Stands without using hands and stabilize independently Standing Unsupported: 4.      Stands safely for 2 minutes Sitting Unsupported: 4.     Sits for 2 minutes independently Standing to Sitting: 4.     Sits safely with minimal use of hands Transfers: 4.     Transfers safely with minor use of hands Standing with eyes closed: 4.     Stands safely for 10 seconds  Standing with feet together: 4.     Stands for 1 minute safely Reaching forward with outstretched arm: 4.     Reaches forward 10 inches Retrieving object from the floor: 4.      Able to pick up easily and safely Turning to look behind: 4.     Looks behind from both sides and weight shifts well Turning 360 degrees: 2.     Able to turn slowly, but safely Place alternate foot on stool: 2.     4 steps without assistance/supervision Standing with one foot in front: 2.     Independent small step for 30 seconds Standing on one foot: 1.     Holds <3 seconds Total Score: 47/56  FUNCTIONAL TESTS:  5 times sit to stand: 14 seconds  09/11/2022: 10 seconds 6 minute walk test: 925 ft using [facility]  10/02/2022: 1,110 ft without AD BERG balance test: 41/56  10/02/2022: 47/56  GAIT: Distance walked: 925 ft Assistive device utilized: Single point cane Level of assistance: Modified independence Comments: Unsteady gait  10/02/2022: 1,110 ft without AD, wide base of support with bilateral toe out   TODAY'S TREATMENT: [facility] Adult PT Treatment:                                                DATE:  10/02/22 Therapeutic Exercise: NuStep L6 x 5 min with LE while taking subjective Bridge 2 x 10 Side clamshell with green 2 x  15 each Sit to stand holding 10# at chest 2 x 10 Row with green 2 x 15  Therapeutic Activity: Reassessment of objective measures including , BERG, strength to measure progress toward goals   [facility] Adult PT Treatment:                                                DATE: 09/24/22 Therapeutic Exercise: NuStep L6 x 5 min with LE while taking subjective Standing hip abduction with red at knees 3 x 10 each Standing heel raises 2 x 15 Forward 8" step-up 2 x 10 each Bridge 2 x 10 SLR 2 x 15 each Side clamshell with red 2 x 15 each LAQ with 5# 3 x 10 each Sit to stand holding 10# at chest 2 x 10 Row with green 2 x 15   [facility] Adult PT Treatment:                                                DATE: 09/17/22 Therapeutic Exercise: NuStep L6 x 5 min with LE while taking subjective Standing hip abduction with yellow at knees 2 x 15 each Standing hip extension with yellow at knees x 15 each Forward 6" step-up x 10 each, 8" step-up x 10 each Standing heel raises x 20 Sit to stand holding 10# at chest 2 x 10 LAQ with 4# 2 x 15 each Seated hamstring curl with yellow 2 x 15 each Neuromuscular re-ed in // bars: Tandem stance x 30 sec each Romberg on Airex with trunk rotations holding yellow med ball x 10 each Romberg on Airex with 8" step tap x 10 each   [facility] Adult PT Treatment:                                                DATE: 09/11/22 Therapeutic Exercise: NuStep L6 x 5 min with LE while taking subjective Seated hamstring stretch 2 x 30 sec each LTR 10 x 5 sec Bridge x 10 SLR x 15 each Sidelying hip abduction x 15 each LAQ with 3# 2 x 15 each Standing heel raises x 20 Neuromuscular re-ed in // bars: Tandem stance 2 x 30 sec each Rockerboard forward/backward and lateral taps 2 x 10 each Romberg on Airex with trunk rotations holding yellow med ball x 10 Romberg  on Airex with yellow med ball lifts x 10 Romberg on Airex with forward cone tap x 10 each hand  PATIENT EDUCATION:  Education details: POC extension, HEP update Person educated: Patient Education method: Explanation, Demonstration, Tactile cues, Verbal cues, Handout Education comprehension: verbalized understanding, returned demonstration, verbal cues required, tactile cues required, and needs further education  HOME EXERCISE PROGRAM: Access Code: [email or web address]    ASSESSMENT: CLINICAL IMPRESSION: Patient tolerated therapy well with no adverse effects. Reassessed LTGs this visit and patient demonstrates improvement in his walking, balance, and strength but does continue to exhibit limitations in all measures. He has previously achieved his FOTO goal but would benefit from continued skilled PT to work on his strength, balance, and walking tolerance in order to maximize his functional  ability. Therapy focused on hip and LE strengthening, as well as postural exercises and updated his HEP to progress hip abductor strengthening and incorporating postural exercises with good tolerance. Will extend PT POC for 6 more weeks.    OBJECTIVE IMPAIRMENTS: Abnormal gait, decreased activity tolerance, decreased balance, decreased strength, impaired flexibility, impaired sensation, and postural dysfunction.   ACTIVITY LIMITATIONS: lifting, standing, squatting, stairs, dressing, and locomotion level  PARTICIPATION LIMITATIONS: meal prep, cleaning, shopping, and community activity  PERSONAL FACTORS: Fitness, Past/current experiences, Time since onset of injury/illness/exacerbation, and 1 comorbidity: see PMH above  are also affecting patient's functional outcome.    GOALS: Goals reviewed with patient? Yes  SHORT TERM GOALS: Target date: 09/11/2022  Patient will be I with initial HEP in order to progress with therapy. Baseline: HEP provided at eval Goal status: MET  2.  Patient will perform 5xSTS </= 11  seconds in order to indicate improved LE strength and reduced fall risk Baseline: 14 seconds 09/11/2022: 10 seconds Goal status: MET  LONG TERM GOALS: Target date: 11/13/2022  Patient will be I with final HEP to maintain progress from PT. Baseline: HEP provided at eval 10/02/2022: progressing Goal status: ONGOING  2.  Patient will report >/= 63% status on FOTO to indicate improved functional ability. Baseline: 56% functional status 09/11/2022: 63% Goal status: MET  3.  Patient will demonstrate gross hip strength >/= 4/5 MMT in order to improve standing and walking stability Baseline: limitations noted above 10/02/2022: grossly 4-/5 MMT Goal status: PARTIALLY MET  4.  Patient will perform >/= 1400 ft with LRAD in order to improve community access and safety with ambulation Baseline: 925 ft using [facility] 10/02/2022: 1,110 ft without AD Goal status: PARTIALLY MET  5. Patient will demonstrate BERG balance >/= 48/56 in order to indicate improved balance and reduced fall risk  Baseline: 41/56 10/02/2022: 47/56  Goal status: PARTIALLY MET   PLAN: PT FREQUENCY: 1x/week  PT DURATION: 6 weeks  PLANNED INTERVENTIONS: Therapeutic exercises, Therapeutic activity, Neuromuscular re-education, Balance training, Gait training, Patient/Family education, Self Care, Joint mobilization, Aquatic Therapy, Dry Needling, Cryotherapy, Moist heat, Manual therapy, and Re-evaluation  PLAN FOR NEXT SESSION: Review HEP and progress PRN, progress LE strengthening and balance training, incorporate postural strengthening   Hilbert Bible PT 10/02/22 12:38 PM Phone: [phone] Fax: [phone]

## 2022-10-02 ENCOUNTER — Ambulatory Visit: Payer: Medicare Other | Admitting: Physical Therapy

## 2022-10-02 ENCOUNTER — Other Ambulatory Visit: Payer: Self-pay

## 2022-10-02 ENCOUNTER — Encounter: Payer: Self-pay | Admitting: Physical Therapy

## 2022-10-02 DIAGNOSIS — M6281 Muscle weakness (generalized): Secondary | ICD-10-CM

## 2022-10-02 DIAGNOSIS — M542 Cervicalgia: Secondary | ICD-10-CM

## 2022-10-02 NOTE — Patient Instructions (Signed)
Access Code: [email or web address] URL: [email or web address] Date: 10/02/2022 Prepared by: Rosana Hoes  Exercises - Supine Lower Trunk Rotation  - 1 x daily - 10 reps - 5 seconds hold - Active Straight Leg Raise with Quad Set  - 1 x daily - 2 sets - 10 reps - Bridge  - 1 x daily - 2 sets - 10 reps - Clam with Resistance  - 1 x daily - 2 sets - 15 reps - Seated Hamstring Stretch  - 1 x daily - 3 reps - 20 seconds hold - Sit to Stand Without Arm Support  - 1 x daily - 3 sets - 10 reps - Heel Raises with Counter Support  - 1 x daily - 2 sets - 20 reps - Standing Tandem Balance with Counter Support  - 1 x daily - 3 reps - 30 seconds hold - Standing Row with Anchored Resistance  - 1 x daily - 2 sets - 15 reps

## 2022-10-05 ENCOUNTER — Encounter: Payer: Self-pay | Admitting: Nurse Practitioner

## 2022-10-08 ENCOUNTER — Ambulatory Visit (AMBULATORY_SURGERY_CENTER): Payer: Medicare Other | Admitting: *Deleted

## 2022-10-08 VITALS — Ht 67.0 in | Wt 163.0 lb

## 2022-10-08 DIAGNOSIS — D509 Iron deficiency anemia, unspecified: Secondary | ICD-10-CM

## 2022-10-08 DIAGNOSIS — Z1211 Encounter for screening for malignant neoplasm of colon: Secondary | ICD-10-CM

## 2022-10-08 MED ORDER — NA SULFATE-K SULFATE-MG SULF 17.5-3.13-1.6 GM/177ML PO SOLN
1.0000 | Freq: Once | ORAL | 0 refills | Status: AC
Start: 2022-10-08 — End: 2022-10-08

## 2022-10-08 NOTE — Progress Notes (Signed)
Pt's name and DOB verified at the beginning of the pre-visit.  Pt denies any difficulty with ambulating,sitting, laying down or rolling side to side Gave both [facility] main # and MD on call # prior to instructions.  No egg or soy allergy known to patient  No issues known to pt with past sedation with any surgeries or procedures Pt denies having issues being intubated Pt has no issues moving head neck or swallowing No FH of Malignant Hyperthermia Pt is not on diet pills Pt is not on home 02  Pt is not on blood thinners  Pt denies issues with constipation iPt is not on dialysis Pt denise any abnormal heart rhythms  Pt denies any upcoming cardiac testing Pt encouraged to use to use Singlecare or Goodrx to reduce cost  Patient's chart reviewed by Cathlyn Parsons CNRA prior to pre-visit and patient appropriate for the [facility].  Pre-visit completed and red dot placed by patient's name on their procedure day (on provider's schedule).  . Visit by phone Pt states weight is 163 lb Instructed pt why it is important to and  to call if they have any changes in health or new medications. Directed them to the # given and on instructions.   Pt states they will.  Instructions reviewed with pt and pt states understanding. Instructed to review again prior to procedure. Pt states they will.  Instructions sent by mail with coupon and by my chart

## 2022-10-09 ENCOUNTER — Ambulatory Visit: Payer: Medicare Other | Admitting: Physical Therapy

## 2022-10-09 DIAGNOSIS — M6281 Muscle weakness (generalized): Secondary | ICD-10-CM

## 2022-10-09 DIAGNOSIS — M542 Cervicalgia: Secondary | ICD-10-CM | POA: Diagnosis not present

## 2022-10-09 NOTE — Therapy (Signed)
OUTPATIENT PHYSICAL THERAPY TREATMENT NOTE   Patient Name: Andrew Jones MRN: [id number] DOB:12-13-1940, 82 y.o., male Today's Date: 10/09/2022   END OF SESSION:  PT End of Session - 10/09/22 1153     Visit Number 9    Number of Visits 14    Date for PT Re-Evaluation 11/13/22    Authorization Type [employer] MCR    PT Start Time 1149    PT Stop Time 1227    PT Time Calculation (min) 38 min                 Past Medical History:  Diagnosis Date   Anemia due to GI blood loss 08/25/2022   Ankylosing spondylitis (HCC)    back, hips - on embrel 2000-2008, resumed 12/2012   CVA (cerebral vascular accident) (HCC) 1999, 2002   no residual problems   Diabetes mellitus, type 2 (HCC)    Dyslipidemia    Hypertension    Neuropathy    Bilateral Hands/Feet   Osteoarthritis    Seizure disorder (HCC) 05/23/2009   Sleep apnea    uses c-pap   Past Surgical History:  Procedure Laterality Date   ANTERIOR CERVICAL DECOMP/DISCECTOMY FUSION N/A 07/12/2022   Procedure: Anterior Cervical Decompression/Discectomy Fusion Cervical Five-Six, Cervical Six-Seven;  Surgeon: Lisbeth Renshaw, MD;  Location: [facility] OR;  Service: Neurosurgery;  Laterality: N/A;  3C   CATARACT EXTRACTION W/ INTRAOCULAR LENS IMPLANT Bilateral 2022   COLONOSCOPY  2015   INGUINAL HERNIA REPAIR Left 1947   Patient Active Problem List   Diagnosis Date Noted   Abnormal gait 09/03/2022   History of CVA (cerebrovascular accident) without residual deficits 09/03/2022   Long-term use of aspirin therapy 08/25/2022   Spinal stenosis in cervical region 07/12/2022   Iron deficiency anemia 05/06/2022   DM (diabetes mellitus) (HCC) 04/03/2022   Hyperlipidemia associated with type 2 diabetes mellitus (HCC) 04/03/2022   Neuropathy 04/03/2022   Gastroesophageal reflux disease without esophagitis 04/03/2022   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) 07/25/2021   Vitamin D deficiency  07/25/2021   Obesity 07/03/2020   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   Low magnesium level 04/29/2019   Seborrheic keratosis 05/15/2018   Hearing loss 12/01/2017   Benign prostatic hyperplasia 12/20/2014   Ankylosing spondylitis of unspecified sites in spine (HCC) 12/20/2014   Encounter for therapeutic drug monitoring 12/08/2012   Seizure disorder (HCC)    Hypertension    Osteoarthritis     PCP: Anne Ng, NP  REFERRING PROVIDER: Lisbeth Renshaw, MD  REFERRING DIAG: Spinal stenosis, cervical region  THERAPY DIAG:  Muscle weakness (generalized)  Cervicalgia  Rationale for Evaluation and Treatment: Rehabilitation  ONSET DATE: 07/12/2022   SUBJECTIVE:  SUBJECTIVE STATEMENT: Patient reports he can tell he has improved and is increasing his reps of exercises at home.   PERTINENT HISTORY:  Cervical fusion C5-7 07/12/2022  PAIN:  Are you having pain? No   PRECAUTIONS: Fall  WEIGHT BEARING RESTRICTIONS: No  PATIENT GOALS: Improve leg strength and balance   OBJECTIVE:  PATIENT SURVEYS:  FOTO 56% functional status  09/11/2022: 63%  SENSATION: Patient reports bilateral foot and LE numbness from neuropathy  POSTURE:   Rounded shoulders  FLEXIBILITY: Limitations bilateral hamstrings  LOWER EXTREMITY MMT:    MMT Right eval Left eval Rt / Lt 09/11/2022 Rt / Lt 09/24/2022 Rt / Lt 10/02/2022  Hip flexion 4- 4-     Hip extension 3- 3- 3+ / 3+  4- / 4-  Hip abduction 3 3 3+ / 3+ 3+ / 3+ 4- / 4-  Hip adduction       Hip internal rotation       Hip external rotation       Knee flexion 4+ 4+     Knee extension 4+ 4+  4+ / 4+ 5 / 5  Ankle dorsiflexion       Ankle plantarflexion       Ankle inversion       Ankle eversion         (Blank rows = not tested)  BERG BALANCE TEST - 10/02/2022 Sitting to Standing: 4.      Stands without using hands and stabilize independently Standing Unsupported: 4.      Stands safely for 2 minutes Sitting Unsupported: 4.     Sits for 2 minutes independently Standing to Sitting: 4.     Sits safely with minimal use of hands Transfers: 4.     Transfers safely with minor use of hands Standing with eyes closed: 4.     Stands safely for 10 seconds  Standing with feet together: 4.     Stands for 1 minute safely Reaching forward with outstretched arm: 4.     Reaches forward 10 inches Retrieving object from the floor: 4.      Able to pick up easily and safely Turning to look behind: 4.     Looks behind from both sides and weight shifts well Turning 360 degrees: 2.     Able to turn slowly, but safely Place alternate foot on stool: 2.     4 steps without assistance/supervision Standing with one foot in front: 2.     Independent small step for 30 seconds Standing on one foot: 1.     Holds <3 seconds Total Score: 47/56  FUNCTIONAL TESTS:  5 times sit to stand: 14 seconds  09/11/2022: 10 seconds 6 minute walk test: 925 ft using [facility]  10/02/2022: 1,110 ft without AD BERG balance test: 41/56  10/02/2022: 47/56  GAIT: Distance walked: 925 ft Assistive device utilized: Single point cane Level of assistance: Modified independence Comments: Unsteady gait  10/02/2022: 1,110 ft without AD, wide base of support with bilateral toe out   TODAY'S TREATMENT: [facility] Adult PT Treatment:                                                DATE: 10/09/22 Therapeutic Exercise: Nustep L6 x 5 min Standing hip abdct red 3 x 10 each  Heel raises 15 x 2  STS 10# 10 x  2  8 inch step up 10 x 2 each SLR 2 x 13 each  Bridge  2 sets Single leg bridge 10 x 2 each  Side hip abduction 10 x 2 each   Neuromuscular re-ed: AIREX pad narrow with head turns and nods, trunk rotations Tandem with head turns Tandem on  AIREX   [facility] Adult PT Treatment:                                                DATE: 10/02/22 Therapeutic Exercise: NuStep L6 x 5 min with LE while taking subjective Bridge 2 x 10 Side clamshell with green 2 x 15 each Sit to stand holding 10# at chest 2 x 10 Row with green 2 x 15  Therapeutic Activity: Reassessment of objective measures including , BERG, strength to measure progress toward goals   [facility] Adult PT Treatment:                                                DATE: 09/24/22 Therapeutic Exercise: NuStep L6 x 5 min with LE while taking subjective Standing hip abduction with red at knees 3 x 10 each Standing heel raises 2 x 15 Forward 8" step-up 2 x 10 each Bridge 2 x 10 SLR 2 x 15 each Side clamshell with red 2 x 15 each LAQ with 5# 3 x 10 each Sit to stand holding 10# at chest 2 x 10 Row with green 2 x 15     PATIENT EDUCATION:  Education details: POC extension, HEP update Person educated: Patient Education method: Explanation, Demonstration, Tactile cues, Verbal cues, Handout Education comprehension: verbalized understanding, returned demonstration, verbal cues required, tactile cues required, and needs further education  HOME EXERCISE PROGRAM: Access Code: [email or web address]    ASSESSMENT: CLINICAL IMPRESSION: Patient tolerated therapy well with no adverse effects. Continued balance and Strengthening. Progressed gluteal strengthening and updated HEP.    OBJECTIVE IMPAIRMENTS: Abnormal gait, decreased activity tolerance, decreased balance, decreased strength, impaired flexibility, impaired sensation, and postural dysfunction.   ACTIVITY LIMITATIONS: lifting, standing, squatting, stairs, dressing, and locomotion level  PARTICIPATION LIMITATIONS: meal prep, cleaning, shopping, and community activity  PERSONAL FACTORS: Fitness, Past/current experiences, Time since onset of injury/illness/exacerbation, and 1 comorbidity: see PMH above  are also affecting patient's  functional outcome.    GOALS: Goals reviewed with patient? Yes  SHORT TERM GOALS: Target date: 09/11/2022  Patient will be I with initial HEP in order to progress with therapy. Baseline: HEP provided at eval Goal status: MET  2.  Patient will perform 5xSTS </= 11 seconds in order to indicate improved LE strength and reduced fall risk Baseline: 14 seconds 09/11/2022: 10 seconds Goal status: MET  LONG TERM GOALS: Target date: 11/13/2022  Patient will be I with final HEP to maintain progress from PT. Baseline: HEP provided at eval 10/02/2022: progressing Goal status: ONGOING  2.  Patient will report >/= 63% status on FOTO to indicate improved functional ability. Baseline: 56% functional status 09/11/2022: 63% Goal status: MET  3.  Patient will demonstrate gross hip strength >/= 4/5 MMT in order to improve standing and walking stability Baseline: limitations noted above 10/02/2022: grossly 4-/5 MMT Goal status: PARTIALLY MET  4.  Patient will perform  >/= 1400 ft with LRAD in order to improve community access and safety with ambulation Baseline: 925 ft using [facility] 10/02/2022: 1,110 ft without AD Goal status: PARTIALLY MET  5. Patient will demonstrate BERG balance >/= 48/56 in order to indicate improved balance and reduced fall risk  Baseline: 41/56 10/02/2022: 47/56  Goal status: PARTIALLY MET   PLAN: PT FREQUENCY: 1x/week  PT DURATION: 6 weeks  PLANNED INTERVENTIONS: Therapeutic exercises, Therapeutic activity, Neuromuscular re-education, Balance training, Gait training, Patient/Family education, Self Care, Joint mobilization, Aquatic Therapy, Dry Needling, Cryotherapy, Moist heat, Manual therapy, and Re-evaluation  PLAN FOR NEXT SESSION: Review HEP and progress PRN, progress LE strengthening and balance training, incorporate postural strengthening   Royden Purl PTA 10/09/22 12:42 PM Phone: [phone] Fax: [phone]

## 2022-10-13 ENCOUNTER — Encounter: Payer: Self-pay | Admitting: Nurse Practitioner

## 2022-10-13 DIAGNOSIS — L819 Disorder of pigmentation, unspecified: Secondary | ICD-10-CM

## 2022-10-14 ENCOUNTER — Encounter: Payer: Self-pay | Admitting: Internal Medicine

## 2022-10-15 LAB — PROTEIN / CREATININE RATIO, URINE
Albumin, U: 0.3
Creatinine, Urine: 25.6

## 2022-10-15 NOTE — Therapy (Signed)
OUTPATIENT PHYSICAL THERAPY TREATMENT NOTE   Patient Name: Andrew Jones MRN: [id number] DOB:1940-12-22, 82 y.o., male Today's Date: 10/15/2022   END OF SESSION:        Past Medical History:  Diagnosis Date   Anemia due to GI blood loss 08/25/2022   Ankylosing spondylitis (HCC)    back, hips - on embrel 2000-2008, resumed 12/2012   CVA (cerebral vascular accident) (HCC) 1999, 2002   no residual problems   Diabetes mellitus, type 2 (HCC)    Dyslipidemia    Hypertension    Neuropathy    Bilateral Hands/Feet   Osteoarthritis    Seizure disorder (HCC) 05/23/2009   Sleep apnea    uses c-pap   Past Surgical History:  Procedure Laterality Date   ANTERIOR CERVICAL DECOMP/DISCECTOMY FUSION N/A 07/12/2022   Procedure: Anterior Cervical Decompression/Discectomy Fusion Cervical Five-Six, Cervical Six-Seven;  Surgeon: Lisbeth Renshaw, MD;  Location: [facility] OR;  Service: Neurosurgery;  Laterality: N/A;  3C   CATARACT EXTRACTION W/ INTRAOCULAR LENS IMPLANT Bilateral 2022   COLONOSCOPY  2015   INGUINAL HERNIA REPAIR Left 1947   Patient Active Problem List   Diagnosis Date Noted   Abnormal gait 09/03/2022   History of CVA (cerebrovascular accident) without residual deficits 09/03/2022   Long-term use of aspirin therapy 08/25/2022   Spinal stenosis in cervical region 07/12/2022   Iron deficiency anemia 05/06/2022   DM (diabetes mellitus) (HCC) 04/03/2022   Hyperlipidemia associated with type 2 diabetes mellitus (HCC) 04/03/2022   Neuropathy 04/03/2022   Gastroesophageal reflux disease without esophagitis 04/03/2022   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) 07/25/2021   Vitamin D deficiency 07/25/2021   Obesity 07/03/2020   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  Rationale for Evaluation and Treatment: Rehabilitation  ONSET DATE: 07/12/2022   SUBJECTIVE:  SUBJECTIVE STATEMENT: Patient reports he can tell he has improved and is increasing his reps of exercises at home.   PERTINENT HISTORY:  Cervical fusion C5-7 07/12/2022  PAIN:  Are you having pain? No   PRECAUTIONS: Fall  WEIGHT BEARING RESTRICTIONS: No  PATIENT GOALS: Improve leg strength and balance   OBJECTIVE:  PATIENT SURVEYS:  FOTO 56% functional status  09/11/2022: 63%  SENSATION: Patient reports bilateral foot and LE numbness from neuropathy  POSTURE:   Rounded shoulders  FLEXIBILITY: Limitations bilateral hamstrings  LOWER EXTREMITY MMT:    MMT Right eval Left eval Rt / Lt 09/11/2022 Rt / Lt 09/24/2022 Rt / Lt 10/02/2022  Hip flexion 4- 4-     Hip extension 3- 3- 3+ / 3+  4- / 4-  Hip abduction 3 3 3+ / 3+ 3+ / 3+ 4- / 4-  Hip adduction       Hip internal rotation       Hip external rotation       Knee flexion 4+ 4+     Knee extension 4+ 4+  4+ / 4+ 5 / 5  Ankle dorsiflexion       Ankle plantarflexion       Ankle inversion       Ankle eversion        (Blank rows = not tested)  BERG BALANCE TEST - 10/02/2022 Sitting to Standing: 4.      Stands without using hands and stabilize independently Standing Unsupported: 4.      Stands safely for 2 minutes Sitting Unsupported: 4.     Sits for 2 minutes  independently Standing to Sitting: 4.     Sits safely with minimal use of hands Transfers: 4.     Transfers safely with minor use of hands Standing with eyes closed: 4.     Stands safely for 10 seconds  Standing with feet together: 4.     Stands for 1 minute safely Reaching forward with outstretched arm: 4.     Reaches forward 10 inches Retrieving object from the floor: 4.      Able to pick up easily and safely Turning to look behind: 4.     Looks behind from both sides and weight shifts well Turning 360 degrees: 2.     Able to turn slowly, but safely Place alternate foot on stool: 2.     4 steps without assistance/supervision Standing with one foot in front: 2.     Independent small step for 30 seconds Standing on one foot: 1.     Holds <3 seconds Total Score: 47/56  FUNCTIONAL TESTS:  5 times sit to stand: 14 seconds  09/11/2022: 10 seconds 6 minute walk test: 925 ft using [facility]  10/02/2022: 1,110 ft without AD BERG balance test: 41/56  10/02/2022: 47/56  GAIT: Distance walked: 925 ft Assistive device utilized: Single point cane Level of assistance: Modified independence Comments: Unsteady gait  10/02/2022: 1,110 ft without AD, wide base of support with bilateral toe out   TODAY'S TREATMENT: [facility] Adult PT Treatment:                                                DATE: 10/16/22 Therapeutic Exercise: Nustep L6 x 5 min Standing hip abdct red 3 x 10 each  Heel raises 15 x 2  STS 10# 10 x  2  8 inch step up 10 x 2 each SLR 2 x 13 each  Bridge  2 sets Single leg bridge 10 x 2 each  Side hip abduction 10 x 2 each Neuromuscular re-ed: AIREX pad narrow with head turns and nods, trunk rotations Tandem with head turns Tandem on AIREX   [facility] Adult PT Treatment:                                                DATE: 10/09/22 Therapeutic Exercise: Nustep L6 x 5 min Standing hip abdct red 3 x 10 each  Heel raises 15 x 2  STS 10# 10 x 2  8 inch step up 10 x 2 each SLR 2 x 13 each   Bridge  2 sets Single leg bridge 10 x 2 each  Side hip abduction 10 x 2 each Neuromuscular re-ed: AIREX pad narrow with head turns and nods, trunk rotations Tandem with head turns Tandem on AIREX  [facility] Adult PT Treatment:                                                DATE: 10/02/22 Therapeutic Exercise: NuStep L6 x 5 min with LE while taking subjective Bridge 2 x 10 Side clamshell with green 2 x 15 each Sit to stand holding 10# at chest 2 x 10 Row with green 2 x 15  Therapeutic Activity: Reassessment of objective measures including , BERG, strength to measure progress toward goals  [facility] Adult PT Treatment:                                                DATE: 09/24/22 Therapeutic Exercise: NuStep L6 x 5 min with LE while taking subjective Standing hip abduction with red at knees 3 x 10 each Standing heel raises 2 x 15 Forward 8" step-up 2 x 10 each Bridge 2 x 10 SLR 2 x 15 each Side clamshell with red 2 x 15 each LAQ with 5# 3 x 10 each Sit to stand holding 10# at chest 2 x 10 Row with green 2 x 15     PATIENT EDUCATION:  Education details: POC extension, HEP update Person educated: Patient Education method: Explanation, Demonstration, Tactile cues, Verbal cues, Handout Education comprehension: verbalized understanding, returned demonstration, verbal cues required, tactile cues required, and needs further education  HOME EXERCISE PROGRAM: Access Code: [email or web address]    ASSESSMENT: CLINICAL IMPRESSION: Patient tolerated therapy well with no adverse effects. *** Patient will benefit from continued skilled PT to progress his strength and balance to maximize functional ability.   Continued balance and Strengthening. Progressed gluteal strengthening and updated HEP.    OBJECTIVE IMPAIRMENTS: Abnormal gait, decreased activity tolerance, decreased balance, decreased strength, impaired flexibility, impaired sensation, and postural dysfunction.   ACTIVITY LIMITATIONS: lifting,  standing, squatting, stairs, dressing, and locomotion level  PARTICIPATION LIMITATIONS: meal prep, cleaning, shopping, and community activity  PERSONAL FACTORS: Fitness, Past/current experiences, Time since onset of injury/illness/exacerbation, and 1 comorbidity: see PMH above  are also affecting patient's functional outcome.    GOALS: Goals reviewed with patient? Yes  SHORT TERM GOALS: Target date: 09/11/2022  Patient will be I with initial HEP in order to progress with therapy. Baseline: HEP provided at eval Goal status: MET  2.  Patient will perform 5xSTS </= 11 seconds in order to indicate improved LE strength and reduced fall risk Baseline: 14 seconds 09/11/2022: 10 seconds Goal status: MET  LONG TERM GOALS: Target date: 11/13/2022  Patient will be I with final HEP to maintain progress from PT. Baseline: HEP provided at eval 10/02/2022: progressing Goal status: ONGOING  2.  Patient will report >/= 63% status on FOTO to indicate improved functional ability. Baseline: 56% functional status 09/11/2022: 63% Goal status: MET  3.  Patient will demonstrate gross hip strength >/= 4/5 MMT in order to improve standing and walking stability Baseline: limitations noted above 10/02/2022: grossly 4-/5 MMT Goal status: PARTIALLY MET  4.  Patient will perform >/= 1400 ft with LRAD in order to improve community access and safety with ambulation Baseline: 925 ft using [facility] 10/02/2022: 1,110 ft without AD Goal status: PARTIALLY MET  5. Patient will demonstrate BERG balance >/= 48/56 in order to indicate improved balance and reduced fall risk  Baseline: 41/56 10/02/2022: 47/56  Goal status: PARTIALLY MET   PLAN: PT FREQUENCY: 1x/week  PT DURATION: 6 weeks  PLANNED INTERVENTIONS: Therapeutic exercises, Therapeutic activity, Neuromuscular re-education, Balance training, Gait training, Patient/Family education, Self Care, Joint mobilization, Aquatic Therapy, Dry Needling, Cryotherapy,  Moist heat, Manual therapy, and Re-evaluation  PLAN FOR NEXT SESSION: Review HEP and progress PRN, progress LE strengthening and balance training, incorporate postural strengthening   Hilbert Bible PT 10/15/22 11:26 AM Phone: [phone] Fax: [phone]

## 2022-10-16 ENCOUNTER — Encounter: Payer: Self-pay | Admitting: Physical Therapy

## 2022-10-16 ENCOUNTER — Ambulatory Visit: Payer: Medicare Other | Admitting: Physical Therapy

## 2022-10-16 ENCOUNTER — Other Ambulatory Visit: Payer: Self-pay

## 2022-10-16 DIAGNOSIS — M542 Cervicalgia: Secondary | ICD-10-CM | POA: Diagnosis not present

## 2022-10-16 DIAGNOSIS — M6281 Muscle weakness (generalized): Secondary | ICD-10-CM | POA: Diagnosis not present

## 2022-10-18 ENCOUNTER — Encounter: Payer: Self-pay | Admitting: Nurse Practitioner

## 2022-10-22 ENCOUNTER — Encounter: Payer: Self-pay | Admitting: Internal Medicine

## 2022-10-22 ENCOUNTER — Ambulatory Visit (AMBULATORY_SURGERY_CENTER): Payer: Medicare Other | Admitting: Internal Medicine

## 2022-10-22 VITALS — BP 109/55 | HR 69 | Temp 97.6°F | Resp 12 | Ht 67.0 in | Wt 163.0 lb

## 2022-10-22 DIAGNOSIS — D120 Benign neoplasm of cecum: Secondary | ICD-10-CM | POA: Diagnosis not present

## 2022-10-22 DIAGNOSIS — Z1211 Encounter for screening for malignant neoplasm of colon: Secondary | ICD-10-CM

## 2022-10-22 DIAGNOSIS — K2990 Gastroduodenitis, unspecified, without bleeding: Secondary | ICD-10-CM | POA: Diagnosis not present

## 2022-10-22 DIAGNOSIS — K2970 Gastritis, unspecified, without bleeding: Secondary | ICD-10-CM | POA: Diagnosis not present

## 2022-10-22 DIAGNOSIS — D509 Iron deficiency anemia, unspecified: Secondary | ICD-10-CM | POA: Diagnosis not present

## 2022-10-22 DIAGNOSIS — K319 Disease of stomach and duodenum, unspecified: Secondary | ICD-10-CM | POA: Diagnosis not present

## 2022-10-22 MED ORDER — SODIUM CHLORIDE 0.9 % IV SOLN
500.0000 mL | Freq: Once | INTRAVENOUS | Status: DC
Start: 2022-10-22 — End: 2022-10-22

## 2022-10-22 NOTE — Progress Notes (Signed)
VS by EP  Pt's states no medical or surgical changes since previsit or office visit.

## 2022-10-22 NOTE — Therapy (Signed)
OUTPATIENT PHYSICAL THERAPY TREATMENT NOTE    Patient Name: Andrew Jones MRN: [id number] DOB:Jul 07, 1940, 82 y.o., male Today's Date: 10/23/2022   END OF SESSION:  PT End of Session - 10/23/22 1148     Visit Number 11    Number of Visits 14    Date for PT Re-Evaluation 11/13/22    Authorization Type [employer] MCR    Progress Note Due on Visit 18    PT Start Time 1144    PT Stop Time 1225    PT Time Calculation (min) 41 min    Activity Tolerance Patient tolerated treatment well    Behavior During Therapy WFL for tasks assessed/performed                   Past Medical History:  Diagnosis Date   Anemia due to GI blood loss 08/25/2022   Ankylosing spondylitis (HCC)    back, hips - on embrel 2000-2008, resumed 12/2012   CVA (cerebral vascular accident) (HCC) 1999, 2002   no residual problems   Diabetes mellitus, type 2 (HCC)    Dyslipidemia    Hypertension    Neuropathy    Bilateral Hands/Feet   Osteoarthritis    Seizure disorder (HCC) 05/23/2009   Sleep apnea    uses c-pap   Past Surgical History:  Procedure Laterality Date   ANTERIOR CERVICAL DECOMP/DISCECTOMY FUSION N/A 07/12/2022   Procedure: Anterior Cervical Decompression/Discectomy Fusion Cervical Five-Six, Cervical Six-Seven;  Surgeon: Lisbeth Renshaw, MD;  Location: [facility] OR;  Service: Neurosurgery;  Laterality: N/A;  3C   CATARACT EXTRACTION W/ INTRAOCULAR LENS IMPLANT Bilateral 2022   COLONOSCOPY  2015   INGUINAL HERNIA REPAIR Left 1947   Patient Active Problem List   Diagnosis Date Noted   Abnormal gait 09/03/2022   History of CVA (cerebrovascular accident) without residual deficits 09/03/2022   Long-term use of aspirin therapy 08/25/2022   Spinal stenosis in cervical region 07/12/2022   Iron deficiency anemia 05/06/2022   DM (diabetes mellitus) (HCC) 04/03/2022   Hyperlipidemia associated with type 2 diabetes mellitus (HCC) 04/03/2022   Neuropathy 04/03/2022   Gastroesophageal reflux disease  without esophagitis 04/03/2022   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) 07/25/2021   Vitamin D deficiency 07/25/2021   Obesity 07/03/2020   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   Low magnesium level 04/29/2019   Seborrheic keratosis 05/15/2018   Hearing loss 12/01/2017   Benign prostatic hyperplasia 12/20/2014   Ankylosing spondylitis of unspecified sites in spine (HCC) 12/20/2014   Encounter for therapeutic drug monitoring 12/08/2012   Seizure disorder (HCC)    Hypertension    Osteoarthritis     PCP: Anne Ng, NP  REFERRING PROVIDER: Lisbeth Renshaw, MD  REFERRING DIAG: Spinal stenosis, cervical region  THERAPY DIAG:  Muscle weakness (generalized)  Cervicalgia  Rationale for Evaluation and Treatment: Rehabilitation  ONSET DATE: 07/12/2022   SUBJECTIVE:  SUBJECTIVE STATEMENT: Patient reports he continues to do well. He states he is feeling weaker today because he had to be put to sleep yesterday for colonoscopy and endoscopy.   PERTINENT HISTORY:  Cervical fusion C5-7 07/12/2022  PAIN:  Are you having pain? No   PRECAUTIONS: Fall  WEIGHT BEARING RESTRICTIONS: No  PATIENT GOALS: Improve leg strength and balance   OBJECTIVE:  PATIENT SURVEYS:  FOTO 56% functional status  09/11/2022: 63%  SENSATION: Patient reports bilateral foot and LE numbness from neuropathy  POSTURE:   Rounded shoulders  FLEXIBILITY: Limitations bilateral hamstrings  LOWER EXTREMITY MMT:    MMT Right eval Left eval Rt / Lt 09/11/2022 Rt / Lt 09/24/2022 Rt / Lt 10/02/2022 Rt / Lt 10/16/2022  Hip flexion 4- 4-      Hip extension 3- 3- 3+ / 3+  4- / 4-   Hip abduction 3 3 3+ / 3+ 3+ / 3+ 4- /  4- 4- / 4-  Hip adduction        Hip internal rotation        Hip external rotation        Knee flexion 4+ 4+      Knee extension 4+ 4+  4+ / 4+ 5 / 5   Ankle dorsiflexion        Ankle plantarflexion        Ankle inversion        Ankle eversion         (Blank rows = not tested)  BERG BALANCE TEST - 10/02/2022 Sitting to Standing: 4.      Stands without using hands and stabilize independently Standing Unsupported: 4.      Stands safely for 2 minutes Sitting Unsupported: 4.     Sits for 2 minutes independently Standing to Sitting: 4.     Sits safely with minimal use of hands Transfers: 4.     Transfers safely with minor use of hands Standing with eyes closed: 4.     Stands safely for 10 seconds  Standing with feet together: 4.     Stands for 1 minute safely Reaching forward with outstretched arm: 4.     Reaches forward 10 inches Retrieving object from the floor: 4.      Able to pick up easily and safely Turning to look behind: 4.     Looks behind from both sides and weight shifts well Turning 360 degrees: 2.     Able to turn slowly, but safely Place alternate foot on stool: 2.     4 steps without assistance/supervision Standing with one foot in front: 2.     Independent small step for 30 seconds Standing on one foot: 1.     Holds <3 seconds Total Score: 47/56  FUNCTIONAL TESTS:  5 times sit to stand: 14 seconds  09/11/2022: 10 seconds 6 minute walk test: 925 ft using [facility]  10/02/2022: 1,110 ft without AD BERG balance test: 41/56  10/02/2022: 47/56  GAIT: Distance walked: 925 ft Assistive device utilized: Single point cane Level of assistance: Modified independence Comments: Unsteady gait  10/02/2022: 1,110 ft without AD, wide base of support with bilateral toe out   TODAY'S TREATMENT: [facility] Adult PT Treatment:  DATE: 10/23/22 Therapeutic Exercise: Nustep L6 x 5 min with UE/LE while taking subjective Sit to stand 3 x 10 Knee  extension machine 20# 3 x 10 Bridge 2 x 10 SLR 2 x 10 Hooklying clamshell with green x 20 Row with blue 2 x 15 Neuromuscular re-ed: Lateral band walk with green at knees in // bars x 3 lengths down/back Lateral stepping over hurdles in // bars x 3 lengths down/back Forward stepping over hurdles in // bars x 3 lengths down/back Alternating stool taps while standing on Airex 2 x 20   [facility] Adult PT Treatment:                                                DATE: 10/16/22 Therapeutic Exercise: Nustep L6 x 5 min with UE/LE while taking subjective Standing hip abduction with red 3 x 15 each Heel raises 2 x 15 Sit to stand holding 10# at chest 3 x 10 Row with blue 2 x 20 Lateral 6" step-up-and-over 2 x 10  Neuromuscular re-ed: Lateral stepping over hurdles in // bars x 3 lengths down/back Forward stepping over hurdles in //1 bars x 3 lengths down/back Tandem stance 2 x 30 sec each Romberg on Airex with head turns and nods 2 x 5 each  [facility] Adult PT Treatment:                                                DATE: 10/09/22 Therapeutic Exercise: Nustep L6 x 5 min Standing hip abdct red 3 x 10 each  Heel raises 15 x 2  STS 10# 10 x 2  8 inch step up 10 x 2 each SLR 2 x 13 each  Bridge  2 sets Single leg bridge 10 x 2 each  Side hip abduction 10 x 2 each Neuromuscular re-ed: AIREX pad narrow with head turns and nods, trunk rotations Tandem with head turns Tandem on AIREX  [facility] Adult PT Treatment:                                                DATE: 10/02/22 Therapeutic Exercise: NuStep L6 x 5 min with LE while taking subjective Bridge 2 x 10 Side clamshell with green 2 x 15 each Sit to stand holding 10# at chest 2 x 10 Row with green 2 x 15  Therapeutic Activity: Reassessment of objective measures including , BERG, strength to measure progress toward goals  PATIENT EDUCATION:  Education details: HEP Person educated: Patient Education method: Programmer, multimedia, Demonstration,  Actor cues, Verbal cues Education comprehension: verbalized understanding, returned demonstration, verbal cues required, tactile cues required, and needs further education  HOME EXERCISE PROGRAM: Access Code: [email or web address]    ASSESSMENT: CLINICAL IMPRESSION: Patient tolerated therapy well with no adverse effects. Therapy focused on progressing his standing stability and balance, and LE strengthening. He did report feeling weaker today due to having to be put under anaesthesia yesterday. He does continue to exhibit greater standing instability on the right leg but was able to demonstrate improvement with hurdle stepping. He does report feeling unstable with stepping and when  on foam surface. No changes made to his HEP this visit. Patient will benefit from continued skilled PT to progress his strength and balance to maximize functional ability.    OBJECTIVE IMPAIRMENTS: Abnormal gait, decreased activity tolerance, decreased balance, decreased strength, impaired flexibility, impaired sensation, and postural dysfunction.   ACTIVITY LIMITATIONS: lifting, standing, squatting, stairs, dressing, and locomotion level  PARTICIPATION LIMITATIONS: meal prep, cleaning, shopping, and community activity  PERSONAL FACTORS: Fitness, Past/current experiences, Time since onset of injury/illness/exacerbation, and 1 comorbidity: see PMH above  are also affecting patient's functional outcome.    GOALS: Goals reviewed with patient? Yes  SHORT TERM GOALS: Target date: 09/11/2022  Patient will be I with initial HEP in order to progress with therapy. Baseline: HEP provided at eval Goal status: MET  2.  Patient will perform 5xSTS </= 11 seconds in order to indicate improved LE strength and reduced fall risk Baseline: 14 seconds 09/11/2022: 10 seconds Goal status: MET  LONG TERM GOALS: Target date: 11/13/2022  Patient will be I with final HEP to maintain progress from PT. Baseline: HEP provided at  eval 10/02/2022: progressing Goal status: ONGOING  2.  Patient will report >/= 63% status on FOTO to indicate improved functional ability. Baseline: 56% functional status 09/11/2022: 63% Goal status: MET  3.  Patient will demonstrate gross hip strength >/= 4/5 MMT in order to improve standing and walking stability Baseline: limitations noted above 10/02/2022: grossly 4-/5 MMT Goal status: PARTIALLY MET  4.  Patient will perform >/= 1400 ft with LRAD in order to improve community access and safety with ambulation Baseline: 925 ft using [facility] 10/02/2022: 1,110 ft without AD Goal status: PARTIALLY MET  5. Patient will demonstrate BERG balance >/= 48/56 in order to indicate improved balance and reduced fall risk  Baseline: 41/56 10/02/2022: 47/56  Goal status: PARTIALLY MET   PLAN: PT FREQUENCY: 1x/week  PT DURATION: 6 weeks  PLANNED INTERVENTIONS: Therapeutic exercises, Therapeutic activity, Neuromuscular re-education, Balance training, Gait training, Patient/Family education, Self Care, Joint mobilization, Aquatic Therapy, Dry Needling, Cryotherapy, Moist heat, Manual therapy, and Re-evaluation  PLAN FOR NEXT SESSION: Review HEP and progress PRN, progress LE strengthening and balance training, incorporate postural strengthening   Hilbert Bible PT 10/23/22 12:27 PM Phone: [phone] Fax: [phone]

## 2022-10-22 NOTE — Op Note (Signed)
[facility] Patient Name: Andrew Jones Procedure Date: 10/22/2022 10:50 AM MRN: [id number] Endoscopist: Wilhemina Bonito. Marina Goodell , MD, [id number] Age: 82 Referring MD:  Date of Birth: November 04, 1940 Gender: Male Account #: [id number] Procedure:                Upper GI endoscopy with biopsies Indications:              Iron deficiency anemia Medicines:                Monitored Anesthesia Care Procedure:                Pre-Anesthesia Assessment:                           - Prior to the procedure, a History and Physical                            was performed, and patient medications and                            allergies were reviewed. The patient's tolerance of                            previous anesthesia was also reviewed. The risks                            and benefits of the procedure and the sedation                            options and risks were discussed with the patient.                            All questions were answered, and informed consent                            was obtained. Prior Anticoagulants: The patient has                            taken no anticoagulant or antiplatelet agents. ASA                            Grade Assessment: II - A patient with mild systemic                            disease. After reviewing the risks and benefits,                            the patient was deemed in satisfactory condition to                            undergo the procedure.                           After obtaining informed consent, the endoscope was  passed under direct vision. Throughout the                            procedure, the patient's blood pressure, pulse, and                            oxygen saturations were monitored continuously. The                            GIF [id number] #[id number] was introduced through the                            mouth, and advanced to the second part of duodenum.                            The upper GI  endoscopy was accomplished without                            difficulty. The patient tolerated the procedure                            well. Scope In: Scope Out: Findings:                 The esophagus was normal.                           The stomach revealed a small hiatal hernia. Mucosa                            was somewhat atrophic. There was erythema in the                            gastric antrum. Biopsies were taken with a cold                            forceps for histology.                           The examined duodenum was normal. Biopsies for                            histology were taken with a cold forceps for                            evaluation of celiac disease.                           The cardia and gastric fundus were normal on                            retroflexion. Complications:            No immediate complications. Estimated Blood Loss:     Estimated blood loss: none. Impression:               -  Normal esophagus.                           - Gastric erythema. Biopsied.                           - Normal examined duodenum. Biopsied. Recommendation:           - Patient has a contact number available for                            emergencies. The signs and symptoms of potential                            delayed complications were discussed with the                            patient. Return to normal activities tomorrow.                            Written discharge instructions were provided to the                            patient.                           - Resume previous diet.                           - Continue present medications.                           - Await pathology results.                           - Return to the care of your primary providers Wilhemina Bonito. Marina Goodell, MD 10/22/2022 11:38:55 AM This report has been signed electronically.

## 2022-10-22 NOTE — Progress Notes (Signed)
Called to room to assist during endoscopic procedure.  Patient ID and intended procedure confirmed with present staff. Received instructions for my participation in the procedure from the performing physician.  

## 2022-10-22 NOTE — Op Note (Signed)
[facility] Patient Name: Andrew Jones Procedure Date: 10/22/2022 10:56 AM MRN: [id number] Endoscopist: Wilhemina Bonito. Marina Goodell , MD, [id number] Age: 82 Referring MD:  Date of Birth: 04-20-1940 Gender: Male Account #: [id number] Procedure:                Colonoscopy with cold snare polypectomy x 2 Indications:              Iron deficiency anemia. Previous examination 2013                            with 8 mm right-sided hyperplastic polyp. Medicines:                Monitored Anesthesia Care Procedure:                Pre-Anesthesia Assessment:                           - Prior to the procedure, a History and Physical                            was performed, and patient medications and                            allergies were reviewed. The patient's tolerance of                            previous anesthesia was also reviewed. The risks                            and benefits of the procedure and the sedation                            options and risks were discussed with the patient.                            All questions were answered, and informed consent                            was obtained. Prior Anticoagulants: The patient has                            taken no anticoagulant or antiplatelet agents. ASA                            Grade Assessment: II - A patient with mild systemic                            disease. After reviewing the risks and benefits,                            the patient was deemed in satisfactory condition to                            undergo the procedure.  After obtaining informed consent, the colonoscope                            was passed under direct vision. Throughout the                            procedure, the patient's blood pressure, pulse, and                            oxygen saturations were monitored continuously. The                            Olympus [id number]) Colonoscope was                             introduced through the anus and advanced to the the                            cecum, identified by appendiceal orifice and                            ileocecal valve. The ileocecal valve, appendiceal                            orifice, and rectum were photographed. The quality                            of the bowel preparation was excellent. The                            colonoscopy was performed without difficulty. The                            patient tolerated the procedure well. The bowel                            preparation used was SUPREP via split dose                            instruction. Scope In: 11:04:25 AM Scope Out: 11:18:23 AM Scope Withdrawal Time: 0 hours 12 minutes 6 seconds  Total Procedure Duration: 0 hours 13 minutes 58 seconds  Findings:                 Two polyps were found in the cecum. The polyps were                            3 to 4 mm in size. These polyps were removed with a                            cold snare. Resection and retrieval were complete.                           Multiple diverticula were found in the left  colon                            and right colon.                           The exam was otherwise without abnormality on                            direct and retroflexion views. Complications:            No immediate complications. Estimated blood loss:                            None. Estimated Blood Loss:     Estimated blood loss: none. Impression:               - Two 3 to 4 mm polyps in the cecum, removed with a                            cold snare. Resected and retrieved.                           - Diverticulosis in the left colon and in the right                            colon.                           - The examination was otherwise normal on direct                            and retroflexion views. Recommendation:           - Repeat colonoscopy is not recommended for                            surveillance.                            - Patient has a contact number available for                            emergencies. The signs and symptoms of potential                            delayed complications were discussed with the                            patient. Return to normal activities tomorrow.                            Written discharge instructions were provided to the                            patient.                           -  Resume previous diet.                           - Continue present medications.                           - Await pathology results. Wilhemina Bonito. Marina Goodell, MD 10/22/2022 11:23:54 AM This report has been signed electronically.

## 2022-10-22 NOTE — Progress Notes (Signed)
Expand All Collapse All HISTORY OF PRESENT ILLNESS:   Andrew Jones is a 82 y.o. 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   I last saw the patient November 13, 2011 when he underwent colonoscopy for the purposes of screening.  He was found to have an 8 mm sessile polyp in the ascending colon which was deemed hyperplastic.  Mild pandiverticulosis seen incidentally.  Otherwise normal.  He has not been seen since.   Patient gets routine blood work and his medications at the [facility].  About 3 months ago he was told by the [facility] that he had anemia with low iron.  He could not tolerate oral iron due to severe constipation and was sent to hematology who provided iron infusions.  He has had improvement in his blood counts.  Last hemoglobin July 05, 2022 was 13.8.  Hematology recommended further evaluation of his iron deficiency anemia.  Patient does take an aspirin.  No blood thinners.  He was placed on omeprazole years ago when he was taking NSAIDs for his arthritis.  No longer taking NSAIDs.  Still on PPI.  He is now taking a Flintstone vitamin with iron for his iron deficiency anemia.  He reports having had a negative fit test within the past year.   He is on multiple medications for diabetes including Ozempic.  On Ozempic he is lost 20 pounds and had significant improvement in his hemoglobin A1c.   His GI review of systems is unremarkable.  No reflux, abdominal pain, melena, hematochezia, or change in bowel habits.  He did have some transient mild dysphagia, which has resolved, secondary to cervical spine surgery.  No family history of colon cancer.   CT scan April 2021 and ultrasound August 2021 reviewed.  No acute abnormalities.   REVIEW OF SYSTEMS:   All non-GI ROS negative unless otherwise stated in the HPI        Past Medical History:  Diagnosis Date   Ankylosing  spondylitis (HCC)      back, hips - on embrel 2000-2008, resumed 12/2012   CVA (cerebral vascular accident) (HCC) 1999, 2002    no residual problems   Diabetes mellitus, type 2 (HCC)     Dyslipidemia     Hypertension     Neuropathy      Bilateral Hands/Feet   Osteoarthritis     Seizure disorder (HCC) 05/23/2009   Sleep apnea      uses c-pap               Past Surgical History:  Procedure Laterality Date   ANTERIOR CERVICAL DECOMP/DISCECTOMY FUSION N/A 07/12/2022    Procedure: Anterior Cervical Decompression/Discectomy Fusion Cervical Five-Six, Cervical Six-Seven;  Surgeon: Lisbeth Renshaw, MD;  Location: [facility] OR;  Service: Neurosurgery;  Laterality: N/A;  3C   CATARACT EXTRACTION W/ INTRAOCULAR LENS IMPLANT Bilateral 2022   COLONOSCOPY   2015   INGUINAL HERNIA REPAIR Left 1947          Social History DAQUAVIOUS DELUKE  reports that he quit smoking about 30 years ago. His smoking use included cigarettes. He has never used smokeless tobacco. He reports that he does not currently use alcohol. He reports that he does not use drugs.   family history includes COPD in his father; Esophageal cancer in an other family member; Heart disease in his mother; Heart failure in his mother; Prostate cancer in  his brother.   Allergies      Allergies  Allergen Reactions   Simvastatin Other (See Comments)            PHYSICAL EXAMINATION: Vital signs: BP 136/64   Pulse 95   Ht 5\' 7"  (1.702 m)   Wt 166 lb (75.3 kg)   BMI 26.00 kg/m   Constitutional: generally well-appearing, no acute distress Psychiatric: alert and oriented x3, cooperative Eyes: extraocular movements intact, anicteric, conjunctiva pink Ears: Hearing aids Mouth: oral pharynx moist, no lesions Neck: supple no lymphadenopathy Cardiovascular: heart regular rate and rhythm, no murmur Lungs: clear to auscultation bilaterally Abdomen: soft, nontender, nondistended, no obvious ascites, no peritoneal signs, normal bowel  sounds, no organomegaly Rectal: Deferred until colonoscopy Extremities: no clubbing, cyanosis, or lower extremity edema bilaterally Skin: no lesions on visible extremities Neuro: No focal deficits.  Cranial nerves intact   ASSESSMENT:   1.  Iron deficiency anemia.  Rule out GI mucosal lesion 2.  Colonoscopy 2013 with right-sided hyperplastic polyp 3.  Multiple general medical problems including diabetes     PLAN:   1.  Colonoscopy to evaluate iron deficiency anemia.The nature of the procedure, as well as the risks, benefits, and alternatives were carefully and thoroughly reviewed with the patient. Ample time for discussion and questions allowed. The patient understood, was satisfied, and agreed to proceed. 2.  Upper endoscopy to evaluate iron deficiency anemia.The nature of the procedure, as well as the risks, benefits, and alternatives were carefully and thoroughly reviewed with the patient. Ample time for discussion and questions allowed. The patient understood, was satisfied, and agreed to proceed. 3.  Hold diabetic medications the day of the procedure in order to avoid unwanted hypoglycemia. 4.  Hold Ozempic at least 1 week prior to the procedures.  He agrees. 5.  Ongoing monitoring of iron deficiency anemia per hematology      Recent H&P as above.  No interval change.  Now for colonoscopy and upper endoscopy.

## 2022-10-22 NOTE — Patient Instructions (Signed)
Discharge instructions given. Handouts on polyps,diverticulosis and Hiatal Hernia. Resume previous medications. YOU HAD AN ENDOSCOPIC PROCEDURE TODAY AT [facility]:   Refer to the procedure report that was given to you for any specific questions about what was found during the examination.  If the procedure report does not answer your questions, please call your gastroenterologist to clarify.  If you requested that your care partner not be given the details of your procedure findings, then the procedure report has been included in a sealed envelope for you to review at your convenience later.  YOU SHOULD EXPECT: Some feelings of bloating in the abdomen. Passage of more gas than usual.  Walking can help get rid of the air that was put into your GI tract during the procedure and reduce the bloating. If you had a lower endoscopy (such as a colonoscopy or flexible sigmoidoscopy) you may notice spotting of blood in your stool or on the toilet paper. If you underwent a bowel prep for your procedure, you may not have a normal bowel movement for a few days.  Please Note:  You might notice some irritation and congestion in your nose or some drainage.  This is from the oxygen used during your procedure.  There is no need for concern and it should clear up in a day or so.  SYMPTOMS TO REPORT IMMEDIATELY:  Following lower endoscopy (colonoscopy or flexible sigmoidoscopy):  Excessive amounts of blood in the stool  Significant tenderness or worsening of abdominal pains  Swelling of the abdomen that is new, acute  Fever of 100F or higher  Following upper endoscopy (EGD)  Vomiting of blood or coffee ground material  New chest pain or pain under the shoulder blades  Painful or persistently difficult swallowing  New shortness of breath  Fever of 100F or higher  Black, tarry-looking stools  For urgent or emergent issues, a gastroenterologist can be reached at any hour by calling [phone]  [phone]. Do not use MyChart messaging for urgent concerns.    DIET:  We do recommend a small meal at first, but then you may proceed to your regular diet.  Drink plenty of fluids but you should avoid alcoholic beverages for 24 hours.  ACTIVITY:  You should plan to take it easy for the rest of today and you should NOT DRIVE or use heavy machinery until tomorrow (because of the sedation medicines used during the test).    FOLLOW UP: Our staff will call the number listed on your records the next business day following your procedure.  We will call around 7:15- 8:00 am to check on you and address any questions or concerns that you may have regarding the information given to you following your procedure. If we do not reach you, we will leave a message.     If any biopsies were taken you will be contacted by phone or by letter within the next 1-3 weeks.  Please call us at [phone] if you have not heard about the biopsies in 3 weeks.    SIGNATURES/CONFIDENTIALITY: You and/or your care partner have signed paperwork which will be entered into your electronic medical record.  These signatures attest to the fact that that the information above on your After Visit Summary has been reviewed and is understood.  Full responsibility of the confidentiality of this discharge information lies with you and/or your care-partner.

## 2022-10-22 NOTE — Progress Notes (Signed)
Report to PACU, RN, vss, BBS= Clear.  

## 2022-10-23 ENCOUNTER — Encounter: Payer: Self-pay | Admitting: Physical Therapy

## 2022-10-23 ENCOUNTER — Other Ambulatory Visit: Payer: Self-pay

## 2022-10-23 ENCOUNTER — Ambulatory Visit: Payer: Medicare Other | Admitting: Physical Therapy

## 2022-10-23 ENCOUNTER — Encounter: Payer: Self-pay | Admitting: Nurse Practitioner

## 2022-10-23 ENCOUNTER — Telehealth: Payer: Self-pay

## 2022-10-23 DIAGNOSIS — M542 Cervicalgia: Secondary | ICD-10-CM | POA: Diagnosis not present

## 2022-10-23 DIAGNOSIS — M6281 Muscle weakness (generalized): Secondary | ICD-10-CM

## 2022-10-23 NOTE — Telephone Encounter (Signed)
  Follow up Call-     10/22/2022   10:10 AM  Call back number  Post procedure Call Back phone  # [phone]  Permission to leave phone message Yes     Patient questions:  Do you have a fever, pain , or abdominal swelling? No. Pain Score  0 *  Have you tolerated food without any problems? Yes.    Have you been able to return to your normal activities? Yes.    Do you have any questions about your discharge instructions: Diet   No. Medications  No. Follow up visit  No.  Do you have questions or concerns about your Care? No.  Actions: * If pain score is 4 or above: No action needed, pain <4.

## 2022-10-24 ENCOUNTER — Encounter: Payer: Self-pay | Admitting: Internal Medicine

## 2022-10-28 ENCOUNTER — Other Ambulatory Visit: Payer: Self-pay | Admitting: Nurse Practitioner

## 2022-10-28 DIAGNOSIS — G629 Polyneuropathy, unspecified: Secondary | ICD-10-CM

## 2022-10-30 ENCOUNTER — Ambulatory Visit: Payer: Medicare Other | Attending: Neurosurgery | Admitting: Physical Therapy

## 2022-10-30 ENCOUNTER — Encounter: Payer: Self-pay | Admitting: Physical Therapy

## 2022-10-30 DIAGNOSIS — M6281 Muscle weakness (generalized): Secondary | ICD-10-CM | POA: Insufficient documentation

## 2022-10-30 DIAGNOSIS — M542 Cervicalgia: Secondary | ICD-10-CM | POA: Insufficient documentation

## 2022-10-30 NOTE — Therapy (Signed)
OUTPATIENT PHYSICAL THERAPY TREATMENT NOTE    Patient Name: Andrew Jones MRN: [id number] DOB:November 11, 1940, 82 y.o., male Today's Date: 10/30/2022   END OF SESSION:  PT End of Session - 10/30/22 1156     Visit Number 12    Number of Visits 14    Date for PT Re-Evaluation 11/13/22    Authorization Type BCBS MCR    PT Start Time 1155    PT Stop Time 1233    PT Time Calculation (min) 38 min                   Past Medical History:  Diagnosis Date   Anemia due to GI blood loss 08/25/2022   Ankylosing spondylitis (HCC)    back, hips - on embrel 2000-2008, resumed 12/2012   CVA (cerebral vascular accident) (HCC) 1999, 2002   no residual problems   Diabetes mellitus, type 2 (HCC)    Dyslipidemia    Hypertension    Neuropathy    Bilateral Hands/Feet   Osteoarthritis    Seizure disorder (HCC) 05/23/2009   Sleep apnea    uses c-pap   Past Surgical History:  Procedure Laterality Date   ANTERIOR CERVICAL DECOMP/DISCECTOMY FUSION N/A 07/12/2022   Procedure: Anterior Cervical Decompression/Discectomy Fusion Cervical Five-Six, Cervical Six-Seven;  Surgeon: Lisbeth Renshaw, MD;  Location: [facility] OR;  Service: Neurosurgery;  Laterality: N/A;  3C   CATARACT EXTRACTION W/ INTRAOCULAR LENS IMPLANT Bilateral 2022   COLONOSCOPY  2015   INGUINAL HERNIA REPAIR Left 1947   Patient Active Problem List   Diagnosis Date Noted   Abnormal gait 09/03/2022   History of CVA (cerebrovascular accident) without residual deficits 09/03/2022   Long-term use of aspirin therapy 08/25/2022   Spinal stenosis in cervical region 07/12/2022   Iron deficiency anemia 05/06/2022   DM (diabetes mellitus) (HCC) 04/03/2022   Hyperlipidemia associated with type 2 diabetes mellitus (HCC) 04/03/2022   Neuropathy 04/03/2022   Gastroesophageal reflux disease without esophagitis 04/03/2022   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) 07/25/2021   Vitamin D  deficiency 07/25/2021   Obesity 07/03/2020   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   Low magnesium level 04/29/2019   Seborrheic keratosis 05/15/2018   Hearing loss 12/01/2017   Benign prostatic hyperplasia 12/20/2014   Ankylosing spondylitis of unspecified sites in spine (HCC) 12/20/2014   Encounter for therapeutic drug monitoring 12/08/2012   Seizure disorder (HCC)    Hypertension    Osteoarthritis     PCP: Anne Ng, NP  REFERRING PROVIDER: Lisbeth Renshaw, MD  REFERRING DIAG: Spinal stenosis, cervical region  THERAPY DIAG:  Muscle weakness (generalized)  Cervicalgia  Rationale for Evaluation and Treatment: Rehabilitation  ONSET DATE: 07/12/2022   SUBJECTIVE:  SUBJECTIVE STATEMENT: Patient reports he continues to do well. He states that he can walk farther now, feels like he has benefited from PT.  He thinks he will be ready to DC at next visit.   PERTINENT HISTORY:  Cervical fusion C5-7 07/12/2022  PAIN:  Are you having pain? No   PRECAUTIONS: Fall  WEIGHT BEARING RESTRICTIONS: No  PATIENT GOALS: Improve leg strength and balance   OBJECTIVE:  PATIENT SURVEYS:  FOTO 56% functional status  09/11/2022: 63% FOTO 80 10/30/22  SENSATION: Patient reports bilateral foot and LE numbness from neuropathy  POSTURE:   Rounded shoulders  FLEXIBILITY: Limitations bilateral hamstrings  LOWER EXTREMITY MMT:    MMT Right eval Left eval Rt / Lt 09/11/2022 Rt / Lt 09/24/2022 Rt / Lt 10/02/2022 Rt / Lt 10/16/2022  Hip flexion 4- 4-      Hip extension 3- 3- 3+ / 3+  4- / 4-   Hip abduction 3 3 3+ / 3+ 3+ / 3+ 4- / 4- 4- / 4-  Hip adduction        Hip internal rotation        Hip external rotation        Knee flexion 4+ 4+       Knee extension 4+ 4+  4+ / 4+ 5 / 5   Ankle dorsiflexion        Ankle plantarflexion        Ankle inversion        Ankle eversion         (Blank rows = not tested)  BERG BALANCE TEST - 10/02/2022 Sitting to Standing: 4.      Stands without using hands and stabilize independently Standing Unsupported: 4.      Stands safely for 2 minutes Sitting Unsupported: 4.     Sits for 2 minutes independently Standing to Sitting: 4.     Sits safely with minimal use of hands Transfers: 4.     Transfers safely with minor use of hands Standing with eyes closed: 4.     Stands safely for 10 seconds  Standing with feet together: 4.     Stands for 1 minute safely Reaching forward with outstretched arm: 4.     Reaches forward 10 inches Retrieving object from the floor: 4.      Able to pick up easily and safely Turning to look behind: 4.     Looks behind from both sides and weight shifts well Turning 360 degrees: 2.     Able to turn slowly, but safely Place alternate foot on stool: 2.     4 steps without assistance/supervision Standing with one foot in front: 2.     Independent small step for 30 seconds Standing on one foot: 1.     Holds <3 seconds Total Score: 47/56  FUNCTIONAL TESTS:  5 times sit to stand: 14 seconds  09/11/2022: 10 seconds 6 minute walk test: 925 ft using [facility]  10/02/2022: 1,110 ft without AD BERG balance test: 41/56  10/02/2022: 47/56  GAIT: Distance walked: 925 ft Assistive device utilized: Single point cane Level of assistance: Modified independence Comments: Unsteady gait  10/02/2022: 1,110 ft without AD, wide base of support with bilateral toe out   TODAY'S TREATMENT: [facility] Adult PT Treatment:  DATE: 10/30/22 Therapeutic Exercise: Nustep L6 UE/LE x 5 minutes  STS x 20 x 10 SLR  15 each Bridge x 10 Side Clam x 20 each   Therapeutic Activity: FOTO 80 + progress/meet goal Lateral band walk with green at knees in // bars x 3  lengths down/back Lateral stepping over hurdles in // bars x 3 lengths down/back Forward step on and off AIREX pad x8 Side step on and off AIREX alternating sides x 8 Narrow stance AIREX trunk rotations Toe Tap hurdle from AIREX pad     [facility] Adult PT Treatment:                                                DATE: 10/23/22 Therapeutic Exercise: Nustep L6 x 5 min with UE/LE while taking subjective Sit to stand 3 x 10 Knee extension machine 20# 3 x 10 Bridge 2 x 10 SLR 2 x 10 Hooklying clamshell with green x 20 Row with blue 2 x 15 Neuromuscular re-ed: Lateral band walk with green at knees in // bars x 3 lengths down/back Lateral stepping over hurdles in // bars x 3 lengths down/back Forward stepping over hurdles in // bars x 3 lengths down/back Alternating stool taps while standing on Airex 2 x 20   [facility] Adult PT Treatment:                                                DATE: 10/16/22 Therapeutic Exercise: Nustep L6 x 5 min with UE/LE while taking subjective Standing hip abduction with red 3 x 15 each Heel raises 2 x 15 Sit to stand holding 10# at chest 3 x 10 Row with blue 2 x 20 Lateral 6" step-up-and-over 2 x 10  Neuromuscular re-ed: Lateral stepping over hurdles in // bars x 3 lengths down/back Forward stepping over hurdles in //1 bars x 3 lengths down/back Tandem stance 2 x 30 sec each Romberg on Airex with head turns and nods 2 x 5 each  [facility] Adult PT Treatment:                                                DATE: 10/09/22 Therapeutic Exercise: Nustep L6 x 5 min Standing hip abdct red 3 x 10 each  Heel raises 15 x 2  STS 10# 10 x 2  8 inch step up 10 x 2 each SLR 2 x 13 each  Bridge  2 sets Single leg bridge 10 x 2 each  Side hip abduction 10 x 2 each Neuromuscular re-ed: AIREX pad narrow with head turns and nods, trunk rotations Tandem with head turns Tandem on AIREX  [facility] Adult PT Treatment:                                                DATE:  10/02/22 Therapeutic Exercise: NuStep L6 x 5 min with LE while taking subjective Bridge 2 x 10 Side clamshell with green 2 x  15 each Sit to stand holding 10# at chest 2 x 10 Row with green 2 x 15  Therapeutic Activity: Reassessment of objective measures including , BERG, strength to measure progress toward goals  PATIENT EDUCATION:  Education details: HEP Person educated: Patient Education method: Explanation, Demonstration, Tactile cues, Verbal cues Education comprehension: verbalized understanding, returned demonstration, verbal cues required, tactile cues required, and needs further education  HOME EXERCISE PROGRAM: Access Code: [email or web address]    ASSESSMENT: CLINICAL IMPRESSION: Patient tolerated therapy well with no adverse effects. Therapy focused on progressing his standing stability and balance, and LE strengthening.  He does continue to exhibit greater standing instability on the right leg but was able to perform hurdle stepping and toe taps from AIREX without LOB.  No changes made to his HEP this visit. FOTO score improved from 56% to 80% Pt would like to discharge at next visit.    OBJECTIVE IMPAIRMENTS: Abnormal gait, decreased activity tolerance, decreased balance, decreased strength, impaired flexibility, impaired sensation, and postural dysfunction.   ACTIVITY LIMITATIONS: lifting, standing, squatting, stairs, dressing, and locomotion level  PARTICIPATION LIMITATIONS: meal prep, cleaning, shopping, and community activity  PERSONAL FACTORS: Fitness, Past/current experiences, Time since onset of injury/illness/exacerbation, and 1 comorbidity: see PMH above  are also affecting patient's functional outcome.    GOALS: Goals reviewed with patient? Yes  SHORT TERM GOALS: Target date: 09/11/2022  Patient will be I with initial HEP in order to progress with therapy. Baseline: HEP provided at eval Goal status: MET  2.  Patient will perform 5xSTS </= 11 seconds in order to  indicate improved LE strength and reduced fall risk Baseline: 14 seconds 09/11/2022: 10 seconds Goal status: MET  LONG TERM GOALS: Target date: 11/13/2022  Patient will be I with final HEP to maintain progress from PT. Baseline: HEP provided at eval 10/02/2022: progressing Goal status: ONGOING  2.  Patient will report >/= 63% status on FOTO to indicate improved functional ability. Baseline: 56% functional status 09/11/2022: 63% 10/30/22: 80% Goal status: MET  3.  Patient will demonstrate gross hip strength >/= 4/5 MMT in order to improve standing and walking stability Baseline: limitations noted above 10/02/2022: grossly 4-/5 MMT Goal status: PARTIALLY MET  4.  Patient will perform >/= 1400 ft with LRAD in order to improve community access and safety with ambulation Baseline: 925 ft using [facility] 10/02/2022: 1,110 ft without AD Goal status: PARTIALLY MET  5. Patient will demonstrate BERG balance >/= 48/56 in order to indicate improved balance and reduced fall risk  Baseline: 41/56 10/02/2022: 47/56  Goal status: PARTIALLY MET   PLAN: PT FREQUENCY: 1x/week  PT DURATION: 6 weeks  PLANNED INTERVENTIONS: Therapeutic exercises, Therapeutic activity, Neuromuscular re-education, Balance training, Gait training, Patient/Family education, Self Care, Joint mobilization, Aquatic Therapy, Dry Needling, Cryotherapy, Moist heat, Manual therapy, and Re-evaluation  PLAN FOR NEXT SESSION: check goals and DC   Royden Purl PTA 10/30/22 12:33 PM Phone: [phone] Fax: [phone]

## 2022-10-31 ENCOUNTER — Other Ambulatory Visit: Payer: Self-pay | Admitting: Oncology

## 2022-10-31 DIAGNOSIS — D500 Iron deficiency anemia secondary to blood loss (chronic): Secondary | ICD-10-CM

## 2022-10-31 NOTE — Progress Notes (Signed)
[facility] Cancer Follow up Visit:  Patient Care Team: Nche, Bonna Gains, NP as PCP - General (Internal Medicine) Micki Riley, MD (Neurology) [place], Heather, OD (Optometry) Hilarie Fredrickson, MD (Gastroenterology)  CHIEF COMPLAINTS/PURPOSE OF CONSULTATION:  HISTORY OF PRESENTING ILLNESS: Andrew Jones 82 y.o. male is here because of anemia Medical history notable for ankylosing spondylitis, CVA, diabetes mellitus type 2, hyperlipidemia, hypertension, seizure disorder, sleep apnea on CPAP  January 10, 2022: WBC 10.4 hemoglobin 12.3 platelet count 280 CMP notable for sodium 136 potassium 5.6 ALT 48  April 23, 2022 and May 08, 2022 fecal occult blood testing negative  May 31 2022:  [facility] Hematology Consult  Patient states that he has been told that he is intermittently anemic and was recently placed on oral iron which he doesn't tolerate well due to constipation.  Has never received IV iron nor required PRBC's in the past. Has a normal diet.  No surgical history.  No hematochezia, melena, hemoptysis, hematuria.  No  history of intra-articular or soft tissue bleeding.  Takes Tylenol for pain.  Does not use NSAIDS for pain.  Is not taking oral anticoagulants  Takes ASA 81 mg at night.  No history of abnormal bleeding in family members.  Patient has symptoms of fatigue, pallor,  DOE, decreased performance status.  No pica to ice/starch/dirt. Underwent a colonoscopy about 8 yrs ago which was negative per patient.  No history of EGD.    Social:  Married.  Owns a used car lot.  Tobacco none.  EtOH used to drink one a day but quit.  No Agent Orange Exposure to his knowledge.   WBC 11.1 hemoglobin 12.0 MCV 82 platelet count 335; 47 segs 39 lymphs 10 monos 3 eos 1 basophil Coombs test negative haptoglobin 123 Hemoglobin electrophoresis showed normal adult pattern serum free kappa 30.5 lambda 22.2 with a kappa lambda 1.37 SPEP with IEP showed no paraprotein IgG 782 IgA  97 IgM 51 Ferritin 10 folate 31.1 Copper 62 B12 443 zinc 63 CMP notable for glucose of 116 creatinine 0.83  June 11, 2022 Feraheme 510 mg June 18 2022: Feraheme 510 mg  July 05 2022: WBC 11.6 hemoglobin 13.8 MCV 86 platelet count 319  July 12, 2022: Underwent discectomy C5-6, C6-7 for decompression of spinal cord and existing nerves.  Placement of intervertebral biomechanical device  Aug 02, 2022: Scheduled follow-up for management of anemia.  Feels better as a result of the surgery.  Numbness and weakness in hands, gait have all improved.  To start PT after visit next week.   Still ambulating with a walker.  To see GI in June 2024.   Will begin low dose iron.   Reviewed results of labs.  Ferritin 136.  Copper 76  October 22, 2022: EGD--esophagus normal.  Stomach with small hiatal hernia, mucosa somewhat atrophic.  Erythema in gastric antrum.  Duodenum was normal. Colonoscopy--2 polyps found in cecum removed.  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  Colon polyps adenomas  November 01, 2022: Scheduled follow-up for anemia.  Back pain much improved since undergoing spine surgery.  No longer using a walker or cane to ambulate.  Reviewed results of endoscopy and labs with patient.   Remains on ASA 81 mg daily but not using NSAIDS for pain.  Taking children's MVI with iron.   No longer anemic. Continue children's MVI with iron Hgb 13.6 Ferritin 71 folate 27 B12 426 Zinc 59 Copper 65  Review of Systems - Oncology  MEDICAL HISTORY: Past Medical History:  Diagnosis Date   Anemia due to GI blood loss 08/25/2022   Ankylosing spondylitis (HCC)    back, hips - on embrel 2000-2008, resumed 12/2012   CVA (cerebral vascular accident) (HCC) 1999, 2002   no residual problems   Diabetes mellitus, type 2 (HCC)    Dyslipidemia    Hypertension    Neuropathy    Bilateral Hands/Feet   Osteoarthritis    Seizure disorder (HCC) 05/23/2009    Sleep apnea    uses c-pap    SURGICAL HISTORY: Past Surgical History:  Procedure Laterality Date   ANTERIOR CERVICAL DECOMP/DISCECTOMY FUSION N/A 07/12/2022   Procedure: Anterior Cervical Decompression/Discectomy Fusion Cervical Five-Six, Cervical Six-Seven;  Surgeon: Lisbeth Renshaw, MD;  Location: [facility] OR;  Service: Neurosurgery;  Laterality: N/A;  3C   CATARACT EXTRACTION W/ INTRAOCULAR LENS IMPLANT Bilateral 2022   COLONOSCOPY  2015   INGUINAL HERNIA REPAIR Left 1947    SOCIAL HISTORY: Social History   Socioeconomic History   Marital status: Married    Spouse name: Not on file   Number of children: 1   Years of education: 13   Highest education level: 12th grade  Occupational History   Occupation: Retired   Occupation: retired  Tobacco Use   Smoking status: Former    Current packs/day: 0.00    Types: Cigarettes    Quit date: 1994    Years since quitting: 30.6   Smokeless tobacco: Never   Tobacco comments:    30 yrs ago  Vaping Use   Vaping status: Never Used  Substance and Sexual Activity   Alcohol use: Not Currently   Drug use: No   Sexual activity: Yes  Other Topics Concern   Not on file  Social History Narrative   Lives with wife   Right Handed   Drinks 2-3 cups caffeine daily   Social Determinants of Health   Financial Resource Strain: Low Risk  (09/02/2022)   Overall Financial Resource Strain (CARDIA)    Difficulty of Paying Living Expenses: Not hard at all  Food Insecurity: No Food Insecurity (09/02/2022)   Hunger Vital Sign    Worried About Running Out of Food in the Last Year: Never true    Ran Out of Food in the Last Year: Never true  Transportation Needs: No Transportation Needs (09/02/2022)   PRAPARE - Administrator, Civil Service (Medical): No    Lack of Transportation (Non-Medical): No  Physical Activity: Inactive (09/02/2022)   Exercise Vital Sign    Days of Exercise per Week: 0 days    Minutes of Exercise per Session: 0 min   Stress: No Stress Concern Present (09/02/2022)   [employer] of Occupational Health - Occupational Stress Questionnaire    Feeling of Stress : Not at all  Social Connections: Moderately Isolated (09/02/2022)   Social Connection and Isolation Panel [NHANES]    Frequency of Communication with Friends and Family: Twice a week    Frequency of Social Gatherings with Friends and Family: Once a week    Attends Religious Services: Never    Database administrator or Organizations: No    Attends Engineer, structural: Not on file    Marital Status: Married  Catering manager Violence: Not At Risk (03/28/2021)   Humiliation, Afraid, Rape, and Kick questionnaire    Fear of Current or Ex-Partner: No    Emotionally Abused: No    Physically Abused: No  Sexually Abused: No    FAMILY HISTORY Family History  Problem Relation Age of Onset   Heart failure Mother    Heart disease Mother    COPD Father    Prostate cancer Brother    Colon cancer Neg Hx    Stomach cancer Neg Hx    Colon polyps Neg Hx    Rectal cancer Neg Hx    Esophageal cancer Neg Hx     ALLERGIES:  is allergic to simvastatin.  MEDICATIONS:  Current Outpatient Medications  Medication Sig Dispense Refill   Alirocumab 75 MG/ML SOAJ Inject 75 mg into the skin every 14 (fourteen) days. 2 mL    amLODipine (NORVASC) 10 MG tablet Take 1 tablet (10 mg total) by mouth daily.     aspirin EC 81 MG tablet Take 1 tablet (81 mg total) by mouth daily. Swallow whole. 30 tablet 11   benazepril (LOTENSIN) 10 MG tablet Take 10 mg by mouth daily.     Calcium-Cholecalciferol (QC CALCIUM 500MG -D3) 500-5 MG-MCG TABS Take 1 tablet by mouth in the morning and at bedtime.     Cholecalciferol (VITAMIN D3) 125 MCG (5000 UT) CAPS Take 1 capsule (5,000 Units total) by mouth daily. (Patient not taking: Reported on 10/22/2022) 90 capsule 1   empagliflozin (JARDIANCE) 25 MG TABS tablet Take 1 tablet (25 mg total) by mouth daily. 30 tablet     etanercept (ENBREL) 50 MG/ML injection Inject 50 mg into the skin once a week.     gabapentin (NEURONTIN) 100 MG capsule Take by mouth. (Patient not taking: Reported on 10/22/2022)     Magnesium Oxide 420 MG TABS Take 420 mg by mouth daily after breakfast.  0   metFORMIN (GLUCOPHAGE) 1000 MG tablet Take 1,000 mg by mouth 2 (two) times daily with a meal.     metoprolol tartrate (LOPRESSOR) 25 MG tablet Take 25 mg by mouth 2 (two) times daily.     Multiple Vitamin (MULTIVITAMIN) tablet Take 1 tablet by mouth daily.     Omega-3 Fatty Acids (FISH OIL) 1000 MG CAPS Take by mouth.     pregabalin (LYRICA) 75 MG capsule Take 1 capsule (75 mg total) by mouth 2 (two) times daily. 60 capsule 5   RESTASIS 0.05 % ophthalmic emulsion Place 1 drop into both eyes 2 (two) times daily.     Semaglutide,0.25 or 0.5MG /DOS, 2 MG/3ML SOPN Inject 0.5 mg into the skin every 7 (seven) days.     No current facility-administered medications for this visit.    PHYSICAL EXAMINATION:  ECOG PERFORMANCE STATUS: 1 - Symptomatic but completely ambulatory   There were no vitals filed for this visit.   There were no vitals filed for this visit.    Physical Exam Vitals and nursing note reviewed.  Constitutional:      General: He is not in acute distress.    Appearance: Normal appearance. He is not ill-appearing, toxic-appearing or diaphoretic.     Comments: Here alone.  Has walker  HENT:     Head: Normocephalic and atraumatic.     Right Ear: External ear normal.     Left Ear: External ear normal.     Nose: Nose normal.  Eyes:     General: No scleral icterus.    Conjunctiva/sclera: Conjunctivae normal.     Pupils: Pupils are equal, round, and reactive to light.  Cardiovascular:     Rate and Rhythm: Normal rate and regular rhythm.     Heart sounds:  No friction rub. No gallop.  Pulmonary:     Effort: Pulmonary effort is normal. No respiratory distress.     Breath sounds: Normal breath sounds. No stridor. No  wheezing or rhonchi.  Abdominal:     Palpations: Abdomen is soft. There is no mass.     Tenderness: There is no abdominal tenderness. There is no guarding.     Hernia: No hernia is present.  Musculoskeletal:        General: No deformity.     Cervical back: Normal range of motion and neck supple. No rigidity or tenderness.     Right lower leg: No edema.     Left lower leg: No edema.  Lymphadenopathy:     Head:     Right side of head: No submental, submandibular, tonsillar, preauricular, posterior auricular or occipital adenopathy.     Left side of head: No submental, submandibular, tonsillar, preauricular, posterior auricular or occipital adenopathy.     Cervical: No cervical adenopathy.     Right cervical: No superficial, deep or posterior cervical adenopathy.    Left cervical: No superficial, deep or posterior cervical adenopathy.     Upper Body:     Right upper body: No supraclavicular or axillary adenopathy.     Left upper body: No supraclavicular or axillary adenopathy.     Lower Body: No right inguinal adenopathy. No left inguinal adenopathy.  Skin:    Coloration: Skin is not jaundiced.     Findings: No bruising.  Neurological:     General: No focal deficit present.     Mental Status: He is alert and oriented to person, place, and time.     Motor: Weakness present.     Gait: Gait abnormal.     Comments: Has rollator  Psychiatric:        Mood and Affect: Mood normal.        Behavior: Behavior normal.        Thought Content: Thought content normal.        Judgment: Judgment normal.    LABORATORY DATA: I have personally reviewed the data as listed:  Abstract on 10/23/2022  Component Date Value Ref Range Status   Creatinine, Urine 10/15/2022 25.6   Final   Albumin, U 10/15/2022 0.3   Final    RADIOGRAPHIC STUDIES: I have personally reviewed the radiological images as listed and agree with the findings in the report  No results found.  ASSESSMENT/PLAN   82 y.o.  male is here because of anemia.  Medical history notable for ankylosing spondylitis, CVA, diabetes mellitus type 2, hyperlipidemia, hypertension, seizure disorder, sleep apnea on CPAP  Anemia:  Patient noted to have normocytic anemia on labs drawn March 2023.  Secondary to iron deficiency due to occult GI bleeding exacerbated by antiplatelet therapy  June 11, 2022 Feraheme 510 mg June 18 2022: Feraheme 510 mg   July 05 2022- Hgb 13.8  Aug 02 2022- Ferritin 136.  To start low dose oral iron October 22 2022-  EGD--esophagus normal.  Stomach with small hiatal hernia, mucosa somewhat atrophic.  Erythema in gastric antrum.  Duodenum was normal. Colonoscopy--2 polyps found in cecum removed.  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  Colon polyps adenomas  November 01 2022- Hgb 13.6 Ferritin 71 folate 27 B12 426 Zinc 59 Copper 65.  Continue children's MVI with iron.  Instructed patient to begin Copper supplement 2 mg daily for copper deficiency  Cancer Staging  No matching staging information was found for the patient.    No problem-specific Assessment & Plan notes found for this encounter.    No orders of the defined types were placed in this encounter.   20  minutes was spent in patient care.  This included time spent preparing to see the patient (e.g., review of tests), obtaining and/or reviewing separately obtained history, counseling and educating the patient, ordering medications, tests, or procedures; documenting clinical information in the electronic or other health record, independently interpreting results and communicating results to the patient as well as coordination of care.       All questions were answered. The patient knows to call the clinic with any problems, questions or concerns.  This note was electronically signed.    Loni Muse, MD  10/31/2022 12:28 PM

## 2022-11-01 ENCOUNTER — Inpatient Hospital Stay: Payer: Medicare Other | Attending: Oncology

## 2022-11-01 ENCOUNTER — Other Ambulatory Visit: Payer: Self-pay

## 2022-11-01 ENCOUNTER — Inpatient Hospital Stay: Payer: Medicare Other | Admitting: Oncology

## 2022-11-01 VITALS — BP 124/59 | HR 73 | Temp 97.9°F | Resp 16 | Ht 67.0 in | Wt 163.7 lb

## 2022-11-01 DIAGNOSIS — E610 Copper deficiency: Secondary | ICD-10-CM

## 2022-11-01 DIAGNOSIS — D500 Iron deficiency anemia secondary to blood loss (chronic): Secondary | ICD-10-CM | POA: Diagnosis not present

## 2022-11-01 DIAGNOSIS — Z7982 Long term (current) use of aspirin: Secondary | ICD-10-CM

## 2022-11-01 LAB — CBC WITH DIFFERENTIAL (CANCER CENTER ONLY)
Abs Immature Granulocytes: 0.04 10*3/uL (ref 0.00–0.07)
Basophils Absolute: 0.1 10*3/uL (ref 0.0–0.1)
Basophils Relative: 1 %
Eosinophils Absolute: 0.3 10*3/uL (ref 0.0–0.5)
Eosinophils Relative: 2 %
HCT: 39.9 % (ref 39.0–52.0)
Hemoglobin: 13.6 g/dL (ref 13.0–17.0)
Immature Granulocytes: 0 %
Lymphocytes Relative: 38 %
Lymphs Abs: 4.9 10*3/uL — ABNORMAL HIGH (ref 0.7–4.0)
MCH: 29.8 pg (ref 26.0–34.0)
MCHC: 34.1 g/dL (ref 30.0–36.0)
MCV: 87.5 fL (ref 80.0–100.0)
Monocytes Absolute: 1.2 10*3/uL — ABNORMAL HIGH (ref 0.1–1.0)
Monocytes Relative: 9 %
Neutro Abs: 6.2 10*3/uL (ref 1.7–7.7)
Neutrophils Relative %: 50 %
Platelet Count: 311 10*3/uL (ref 150–400)
RBC: 4.56 MIL/uL (ref 4.22–5.81)
RDW: 13.8 % (ref 11.5–15.5)
WBC Count: 12.8 10*3/uL — ABNORMAL HIGH (ref 4.0–10.5)
nRBC: 0 % (ref 0.0–0.2)

## 2022-11-01 LAB — CMP (CANCER CENTER ONLY)
ALT: 18 U/L (ref 0–44)
AST: 15 U/L (ref 15–41)
Albumin: 4.1 g/dL (ref 3.5–5.0)
Alkaline Phosphatase: 62 U/L (ref 38–126)
Anion gap: 7 (ref 5–15)
BUN: 13 mg/dL (ref 8–23)
CO2: 26 mmol/L (ref 22–32)
Calcium: 9 mg/dL (ref 8.9–10.3)
Chloride: 102 mmol/L (ref 98–111)
Creatinine: 0.7 mg/dL (ref 0.61–1.24)
GFR, Estimated: >60 mL/min (ref >60)
Glucose, Bld: 132 mg/dL — ABNORMAL HIGH (ref 70–99)
Potassium: 3.9 mmol/L (ref 3.5–5.1)
Sodium: 135 mmol/L (ref 135–145)
Total Bilirubin: 0.5 mg/dL (ref 0.3–1.2)
Total Protein: 6.7 g/dL (ref 6.5–8.1)

## 2022-11-01 LAB — VITAMIN B12: Vitamin B-12: 426 pg/mL (ref 180–914)

## 2022-11-01 LAB — FERRITIN: Ferritin: 71 ng/mL (ref 24–336)

## 2022-11-01 LAB — FOLATE: Folate: 26.7 ng/mL (ref >5.9)

## 2022-11-01 NOTE — Patient Instructions (Signed)
Continue a children's multivitamin with iron daily

## 2022-11-04 DIAGNOSIS — K080 Exfoliation of teeth due to systemic causes: Secondary | ICD-10-CM | POA: Diagnosis not present

## 2022-11-06 ENCOUNTER — Ambulatory Visit: Payer: Medicare Other | Admitting: Physical Therapy

## 2022-11-06 ENCOUNTER — Encounter: Payer: Self-pay | Admitting: Physical Therapy

## 2022-11-06 DIAGNOSIS — M542 Cervicalgia: Secondary | ICD-10-CM | POA: Diagnosis not present

## 2022-11-06 DIAGNOSIS — M6281 Muscle weakness (generalized): Secondary | ICD-10-CM

## 2022-11-06 NOTE — Therapy (Addendum)
OUTPATIENT PHYSICAL THERAPY TREATMENT NOTE  DISCHARGE    Patient Name: Andrew Jones MRN: [id number] DOB:07-Jul-1940, 82 y.o., male Today's Date: 11/06/2022   END OF SESSION:  PT End of Session - 11/06/22 1148     Visit Number 13    Number of Visits 14    Date for PT Re-Evaluation 11/13/22    Authorization Type [employer] MCR    PT Start Time 1146    PT Stop Time 1230    PT Time Calculation (min) 44 min                   Past Medical History:  Diagnosis Date   Anemia due to GI blood loss 08/25/2022   Ankylosing spondylitis (HCC)    back, hips - on embrel 2000-2008, resumed 12/2012   CVA (cerebral vascular accident) (HCC) 1999, 2002   no residual problems   Diabetes mellitus, type 2 (HCC)    Dyslipidemia    Hypertension    Neuropathy    Bilateral Hands/Feet   Osteoarthritis    Seizure disorder (HCC) 05/23/2009   Sleep apnea    uses c-pap   Past Surgical History:  Procedure Laterality Date   ANTERIOR CERVICAL DECOMP/DISCECTOMY FUSION N/A 07/12/2022   Procedure: Anterior Cervical Decompression/Discectomy Fusion Cervical Five-Six, Cervical Six-Seven;  Surgeon: Lisbeth Renshaw, MD;  Location: [facility] OR;  Service: Neurosurgery;  Laterality: N/A;  3C   CATARACT EXTRACTION W/ INTRAOCULAR LENS IMPLANT Bilateral 2022   COLONOSCOPY  2015   INGUINAL HERNIA REPAIR Left 1947   Patient Active Problem List   Diagnosis Date Noted   Abnormal gait 09/03/2022   History of CVA (cerebrovascular accident) without residual deficits 09/03/2022   Long-term use of aspirin therapy 08/25/2022   Spinal stenosis in cervical region 07/12/2022   Iron deficiency anemia 05/06/2022   DM (diabetes mellitus) (HCC) 04/03/2022   Hyperlipidemia associated with type 2 diabetes mellitus (HCC) 04/03/2022   Neuropathy 04/03/2022   Gastroesophageal reflux disease without esophagitis 04/03/2022   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) 07/25/2021    Vitamin D deficiency 07/25/2021   Obesity 07/03/2020   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   Low magnesium level 04/29/2019   Seborrheic keratosis 05/15/2018   Hearing loss 12/01/2017   Benign prostatic hyperplasia 12/20/2014   Ankylosing spondylitis of unspecified sites in spine (HCC) 12/20/2014   Encounter for therapeutic drug monitoring 12/08/2012   Seizure disorder (HCC)    Hypertension    Osteoarthritis     PCP: Anne Ng, NP  REFERRING PROVIDER: Lisbeth Renshaw, MD  REFERRING DIAG: Spinal stenosis, cervical region  THERAPY DIAG:  Muscle weakness (generalized)  Cervicalgia  Rationale for Evaluation and Treatment: Rehabilitation  ONSET DATE: 07/12/2022   SUBJECTIVE:  SUBJECTIVE STATEMENT: Patient reports he continues to do well. He states that he can walk farther now, feels more balanced, feels like he has benefited from PT.   PERTINENT HISTORY:  Cervical fusion C5-7 07/12/2022  PAIN:  Are you having pain? No   PRECAUTIONS: Fall  WEIGHT BEARING RESTRICTIONS: No  PATIENT GOALS: Improve leg strength and balance   OBJECTIVE:  PATIENT SURVEYS:  FOTO 56% functional status  09/11/2022: 63% FOTO 80 10/30/22  SENSATION: Patient reports bilateral foot and LE numbness from neuropathy  POSTURE:   Rounded shoulders  FLEXIBILITY: Limitations bilateral hamstrings  LOWER EXTREMITY MMT:    MMT Right eval Left eval Rt / Lt 09/11/2022 Rt / Lt 09/24/2022 Rt / Lt 10/02/2022 Rt / Lt 10/16/2022 RT/LT 11/06/22  Hip flexion 4- 4-       Hip extension 3- 3- 3+ / 3+  4- / 4-    Hip abduction 3 3 3+ / 3+ 3+ / 3+ 4- / 4- 4- / 4-  4 / 4  Hip adduction         Hip internal rotation         Hip external rotation         Knee flexion 4+  4+       Knee extension 4+ 4+  4+ / 4+ 5 / 5    Ankle dorsiflexion         Ankle plantarflexion         Ankle inversion         Ankle eversion          (Blank rows = not tested)  BERG BALANCE TEST - 10/02/2022 Sitting to Standing: 4.      Stands without using hands and stabilize independently Standing Unsupported: 4.      Stands safely for 2 minutes Sitting Unsupported: 4.     Sits for 2 minutes independently Standing to Sitting: 4.     Sits safely with minimal use of hands Transfers: 4.     Transfers safely with minor use of hands Standing with eyes closed: 4.     Stands safely for 10 seconds  Standing with feet together: 4.     Stands for 1 minute safely Reaching forward with outstretched arm: 4.     Reaches forward 10 inches Retrieving object from the floor: 4.      Able to pick up easily and safely Turning to look behind: 4.     Looks behind from both sides and weight shifts well Turning 360 degrees: 2.     Able to turn slowly, but safely Place alternate foot on stool: 2.     4 steps without assistance/supervision Standing with one foot in front: 2.     Independent small step for 30 seconds Standing on one foot: 1.     Holds <3 seconds Total Score: 47/56  BERG BALANCE TEST 11/06/22 Sitting to Standing: 4.      Stands without using hands and stabilize independently Standing Unsupported: 4.      Stands safely for 2 minutes Sitting Unsupported: 4.     Sits for 2 minutes independently Standing to Sitting: 4.     Sits safely with minimal use of hands Transfers: 4.     Transfers safely with minor use of hands Standing with eyes closed: 4.     Stands safely for 10 seconds  Standing with feet together: 4.     Stands for 1 minute safely Reaching forward with outstretched  arm: 4.     Reaches forward 10 inches Retrieving object from the floor: 4.      Able to pick up easily and safely Turning to look behind: 4.     Looks behind from both sides and weight shifts well Turning 360 degrees: 3.      Able to turn on one side in </= 4 seconds Place alternate foot on stool: 4.     Completes 8 steps in 20 seconds     Standing with one foot in front: 4.     Independent tandem for 30 seconds  Standing on one foot: 1.     Holds <3 seconds  Total Score: 52/56   FUNCTIONAL TESTS:  5 times sit to stand: 14 seconds  09/11/2022: 10 seconds 6 minute walk test: 925 ft using [facility]  10/02/2022: 1,110 ft without AD  11/06/22:1,543 ft without AD BERG balance test: 41/56  10/02/2022: 47/56  GAIT: Distance walked: 925 ft Assistive device utilized: Single point cane Level of assistance: Modified independence Comments: Unsteady gait  10/02/2022: 1,110 ft without AD, wide base of support with bilateral toe out   TODAY'S TREATMENT: [facility] Adult PT Treatment:                                                DATE: 11/06/22 Therapeutic Exercise: Review of HEP  Therapeutic Activity: Capture of objective measures including , BERG, strength to measure progress toward goals    [facility] Adult PT Treatment:                                                DATE: 10/30/22 Therapeutic Exercise: Nustep L6 UE/LE x 5 minutes  STS x 20 x 10 SLR  15 each Bridge x 10 Side Clam x 20 each   Therapeutic Activity: FOTO 80 + progress/meet goal Lateral band walk with green at knees in // bars x 3 lengths down/back Lateral stepping over hurdles in // bars x 3 lengths down/back Forward step on and off AIREX pad x8 Side step on and off AIREX alternating sides x 8 Narrow stance AIREX trunk rotations Toe Tap hurdle from AIREX pad     [facility] Adult PT Treatment:                                                DATE: 10/23/22 Therapeutic Exercise: Nustep L6 x 5 min with UE/LE while taking subjective Sit to stand 3 x 10 Knee extension machine 20# 3 x 10 Bridge 2 x 10 SLR 2 x 10 Hooklying clamshell with green x 20 Row with blue 2 x 15 Neuromuscular re-ed: Lateral band walk with green at knees in // bars x 3 lengths  down/back Lateral stepping over hurdles in // bars x 3 lengths down/back Forward stepping over hurdles in // bars x 3 lengths down/back Alternating stool taps while standing on Airex 2 x 20      PATIENT EDUCATION:  Education details: HEP Person educated: Patient Education method: Explanation, Demonstration, Actor cues, Verbal cues Education comprehension: verbalized understanding, returned demonstration, verbal cues required, tactile  cues required, and needs further education  HOME EXERCISE PROGRAM: Access Code: [email or web address]    ASSESSMENT: CLINICAL IMPRESSION: Patient tolerated therapy well with no adverse effects. Treatment focused on capture of objective measures including , BERG, strength to measure progress toward goals. Based on objective measures and patient's subjective reports, pt has met all LTGS and is appropriate and agreeable to Discharge to HEP.  Pt demonstrates Independence with final HEP.     OBJECTIVE IMPAIRMENTS: Abnormal gait, decreased activity tolerance, decreased balance, decreased strength, impaired flexibility, impaired sensation, and postural dysfunction.   ACTIVITY LIMITATIONS: lifting, standing, squatting, stairs, dressing, and locomotion level  PARTICIPATION LIMITATIONS: meal prep, cleaning, shopping, and community activity  PERSONAL FACTORS: Fitness, Past/current experiences, Time since onset of injury/illness/exacerbation, and 1 comorbidity: see PMH above  are also affecting patient's functional outcome.    GOALS: Goals reviewed with patient? Yes  SHORT TERM GOALS: Target date: 09/11/2022  Patient will be I with initial HEP in order to progress with therapy. Baseline: HEP provided at eval Goal status: MET  2.  Patient will perform 5xSTS </= 11 seconds in order to indicate improved LE strength and reduced fall risk Baseline: 14 seconds 09/11/2022: 10 seconds Goal status: MET  LONG TERM GOALS: Target date: 11/13/2022  Patient will be I with  final HEP to maintain progress from PT. Baseline: HEP provided at eval 10/02/2022: progressing Goal status: MET  2.  Patient will report >/= 63% status on FOTO to indicate improved functional ability. Baseline: 56% functional status 09/11/2022: 63% 10/30/22: 80% Goal status: MET  3.  Patient will demonstrate gross hip strength >/= 4/5 MMT in order to improve standing and walking stability Baseline: limitations noted above 10/02/2022: grossly 4-/5 MMT 11/06/22: 4/5 grossly Goal status:  MET  4.  Patient will perform >/= 1400 ft with LRAD in order to improve community access and safety with ambulation Baseline: 925 ft using [facility] 10/02/2022: 1,110 ft without AD 11/04/22: 1,543 ft without AD Goal status:  MET  5. Patient will demonstrate BERG balance >/= 48/56 in order to indicate improved balance and reduced fall risk  Baseline: 41/56 10/02/2022: 47/56 11/04/22: 52/56  Goal status: MET    PLAN: PT FREQUENCY: 1x/week  PT DURATION: 6 weeks  PLANNED INTERVENTIONS: Therapeutic exercises, Therapeutic activity, Neuromuscular re-education, Balance training, Gait training, Patient/Family education, Self Care, Joint mobilization, Aquatic Therapy, Dry Needling, Cryotherapy, Moist heat, Manual therapy, and Re-evaluation  PLAN FOR NEXT SESSION: N/A , DC to HEP today   Royden Purl PTA 11/06/22 12:55 PM Phone: [phone] Fax: [phone]    PHYSICAL THERAPY DISCHARGE SUMMARY  Visits from Start of Care: 13  Current functional level related to goals / functional outcomes: See above   Remaining deficits: See above   Education / Equipment: HEP   Patient agrees to discharge. Patient goals were met. Patient is being discharged due to meeting the stated rehab goals.  Rosana Hoes, PT, DPT, LAT, ATC 11/06/22  1:07 PM Phone: [phone] Fax: [phone]

## 2022-11-12 DIAGNOSIS — K080 Exfoliation of teeth due to systemic causes: Secondary | ICD-10-CM | POA: Diagnosis not present

## 2022-11-13 ENCOUNTER — Ambulatory Visit: Payer: Medicare Other | Admitting: Physical Therapy

## 2022-11-26 ENCOUNTER — Encounter: Payer: Self-pay | Admitting: Oncology

## 2022-11-26 DIAGNOSIS — E610 Copper deficiency: Secondary | ICD-10-CM | POA: Insufficient documentation

## 2022-11-26 DIAGNOSIS — K080 Exfoliation of teeth due to systemic causes: Secondary | ICD-10-CM | POA: Diagnosis not present

## 2022-11-27 ENCOUNTER — Other Ambulatory Visit: Payer: Self-pay | Admitting: Nurse Practitioner

## 2022-11-27 DIAGNOSIS — G629 Polyneuropathy, unspecified: Secondary | ICD-10-CM

## 2022-12-27 DIAGNOSIS — G40909 Epilepsy, unspecified, not intractable, without status epilepticus: Secondary | ICD-10-CM | POA: Diagnosis not present

## 2022-12-27 DIAGNOSIS — E119 Type 2 diabetes mellitus without complications: Secondary | ICD-10-CM | POA: Diagnosis not present

## 2022-12-27 DIAGNOSIS — Z7982 Long term (current) use of aspirin: Secondary | ICD-10-CM | POA: Diagnosis not present

## 2022-12-27 DIAGNOSIS — M1990 Unspecified osteoarthritis, unspecified site: Secondary | ICD-10-CM | POA: Diagnosis not present

## 2022-12-27 DIAGNOSIS — E785 Hyperlipidemia, unspecified: Secondary | ICD-10-CM | POA: Diagnosis not present

## 2022-12-27 DIAGNOSIS — I10 Essential (primary) hypertension: Secondary | ICD-10-CM | POA: Diagnosis not present

## 2023-02-14 ENCOUNTER — Encounter: Payer: Self-pay | Admitting: Oncology

## 2023-03-04 ENCOUNTER — Encounter: Payer: Self-pay | Admitting: Nurse Practitioner

## 2023-03-04 ENCOUNTER — Ambulatory Visit: Payer: Medicare Other | Admitting: Nurse Practitioner

## 2023-03-04 VITALS — BP 110/54 | HR 78 | Temp 98.8°F | Resp 18 | Ht 67.0 in | Wt 169.8 lb

## 2023-03-04 DIAGNOSIS — E1169 Type 2 diabetes mellitus with other specified complication: Secondary | ICD-10-CM

## 2023-03-04 DIAGNOSIS — Z7985 Long-term (current) use of injectable non-insulin antidiabetic drugs: Secondary | ICD-10-CM | POA: Insufficient documentation

## 2023-03-04 DIAGNOSIS — I10 Essential (primary) hypertension: Secondary | ICD-10-CM | POA: Diagnosis not present

## 2023-03-04 DIAGNOSIS — G629 Polyneuropathy, unspecified: Secondary | ICD-10-CM

## 2023-03-04 DIAGNOSIS — Z7984 Long term (current) use of oral hypoglycemic drugs: Secondary | ICD-10-CM

## 2023-03-04 DIAGNOSIS — E1141 Type 2 diabetes mellitus with diabetic mononeuropathy: Secondary | ICD-10-CM

## 2023-03-04 DIAGNOSIS — E0841 Diabetes mellitus due to underlying condition with diabetic mononeuropathy: Secondary | ICD-10-CM

## 2023-03-04 MED ORDER — AMLODIPINE BESYLATE 5 MG PO TABS
5.0000 mg | ORAL_TABLET | Freq: Every day | ORAL | 3 refills | Status: DC
Start: 2023-03-04 — End: 2023-10-07

## 2023-03-04 MED ORDER — PREGABALIN 75 MG PO CAPS: 75.0000 mg | ORAL_CAPSULE | Freq: Two times a day (BID) | ORAL | 1 refills | Status: DC

## 2023-03-04 NOTE — Patient Instructions (Signed)
Decrease amlodipine dose to 5mg  Maintain other med doses Monitor BP at home 2-3x/week in AM Call office if BP >130/80

## 2023-03-04 NOTE — Assessment & Plan Note (Signed)
Pain is controlled with lyrica Refill sent

## 2023-03-04 NOTE — Progress Notes (Signed)
Established Patient Visit  Patient: Andrew Jones   DOB: Sep 27, 1940   82 y.o. Male  MRN: [id number] Visit Date: 03/04/2023  Subjective:    Chief Complaint  Patient presents with   OFFICE VISIT     6 month follow up. Eye exam report from [facility] requested   Diabetes   Hyperlipidemia   Hypertension   HPI Hypertension Low DBP noted, asymptomatic Current use of benzapril, amlodipine, and metoprolol BP Readings from Last 3 Encounters:  03/04/23 (!) 110/54  11/01/22 (!) 124/59  10/22/22 (!) 109/55    Decreased amlodipine dose to 5mg  Advised to monitor BP at home He is to call office if BP >130/80 Maintain other med doses  DM (diabetes mellitus) (HCC) Controlled glucose with use of ozempic, metformin, and jardiance UTD with eye exam by Dr. 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   aspirin EC 81 MG tablet Take 1 tablet (81 mg total) by mouth daily. Swallow whole.   benazepril (LOTENSIN) 10 MG tablet Take 10 mg by mouth daily.   Calcium-Cholecalciferol (QC CALCIUM 500MG -D3) 500-5 MG-MCG TABS Take 1 tablet by mouth in the morning and at bedtime.   Cholecalciferol (VITAMIN D3) 125 MCG (5000 UT) CAPS Take 1 capsule (5,000 Units total) by mouth daily.   empagliflozin (JARDIANCE) 25 MG TABS tablet Take 1 tablet (25 mg total) by mouth daily.    etanercept (ENBREL) 50 MG/ML injection Inject 50 mg into the skin once a week.   Magnesium Oxide 420 MG TABS Take 420 mg by mouth daily after breakfast.   metFORMIN (GLUCOPHAGE) 1000 MG tablet Take 1,000 mg by mouth 2 (two) times daily with a meal.   metoprolol tartrate (LOPRESSOR) 25 MG tablet Take 25 mg by mouth 2 (two) times daily.   Multiple Vitamin (MULTIVITAMIN) tablet Take 1 tablet by mouth daily.   Na Sulfate-K Sulfate-Mg Sulf 17.5-3.13-1.6 GM/177ML SOLN TAKE AS DIRECTED BY PHYSICIAN OR PACKAGE INSTRUCTIONS.   Omega-3 Fatty Acids (FISH OIL) 1000 MG CAPS Take by mouth.   RESTASIS 0.05 % ophthalmic emulsion Place 1 drop into both eyes 2 (two) times daily.   Semaglutide,0.25 or 0.5MG /DOS, 2 MG/3ML SOPN Inject 0.5 mg into the skin every 7 (seven) days.   [DISCONTINUED] amLODipine (NORVASC) 10 MG tablet Take 1 tablet (10 mg total) by mouth daily.   [DISCONTINUED] pregabalin (LYRICA) 75 MG capsule TAKE 1 CAPSULE BY MOUTH 2 TIMES DAILY   No facility-administered medications prior to visit.   Reviewed past medical and social history.   ROS per HPI above      Objective:  BP (!) 110/54   Pulse 78   Temp 98.8 F (37.1 C) (Temporal)   Resp 18   Ht 5\' 7"  (1.702 m)   Wt 169 lb 12.8 oz (  77 kg)   SpO2 98%   BMI 26.59 kg/m      Physical Exam Vitals and nursing note reviewed.  Cardiovascular:     Rate and Rhythm: Normal rate and regular rhythm.     Pulses: Normal pulses.     Heart sounds: Normal heart sounds.  Pulmonary:     Effort: Pulmonary effort is normal.     Breath sounds: Normal breath sounds.  Musculoskeletal:     Right lower leg: No edema.     Left lower leg: No edema.  Neurological:     Mental Status: He is alert and oriented to person, place, and time.  Psychiatric:        Mood and Affect: Mood normal.        Behavior: Behavior normal.        Thought Content: Thought content normal.     No results found for any visits on 03/04/23.    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 [place], report requested Normal UACr LDL at goal, unable to tolerate statin, current use of repatha [facility]- schedule for repeat labs in 60month F/up in 6months      Hypertension    Low DBP noted, asymptomatic Current use of benzapril, amlodipine, and metoprolol BP Readings from Last 3 Encounters:  03/04/23 (!) 110/54  11/01/22 (!) 124/59  10/22/22 (!) 109/55    Decreased amlodipine dose to 5mg  Advised to monitor BP at home He is to call office if BP >130/80 Maintain other med doses      Relevant Medications   amLODipine (NORVASC) 5 MG tablet   Long-term (current) use of injectable non-insulin antidiabetic drugs   Other Visit Diagnoses     Neuropathy       Relevant Medications   pregabalin (LYRICA) 75 MG capsule   Long term (current) use of oral hypoglycemic drugs          Return in about 6 months (around 09/02/2023) for HTN, DM, hyperlipidemia (fasting).     Alysia Penna, NP

## 2023-03-04 NOTE — Assessment & Plan Note (Signed)
Controlled glucose with use of ozempic, metformin, and jardiance UTD with eye exam by Dr. [place], report requested Normal UACr LDL at goal, unable to tolerate statin, current use of repatha [facility]- schedule for repeat labs in 37month F/up in 6months

## 2023-03-04 NOTE — Assessment & Plan Note (Signed)
Low DBP noted, asymptomatic Current use of benzapril, amlodipine, and metoprolol BP Readings from Last 3 Encounters:  03/04/23 (!) 110/54  11/01/22 (!) 124/59  10/22/22 (!) 109/55    Decreased amlodipine dose to 5mg  Advised to monitor BP at home He is to call office if BP >130/80 Maintain other med doses

## 2023-03-10 ENCOUNTER — Encounter: Payer: Self-pay | Admitting: Nurse Practitioner

## 2023-03-10 DIAGNOSIS — H35033 Hypertensive retinopathy, bilateral: Secondary | ICD-10-CM | POA: Insufficient documentation

## 2023-03-12 DIAGNOSIS — K080 Exfoliation of teeth due to systemic causes: Secondary | ICD-10-CM | POA: Diagnosis not present

## 2023-03-22 ENCOUNTER — Encounter: Payer: Self-pay | Admitting: Nurse Practitioner

## 2023-03-31 ENCOUNTER — Ambulatory Visit (INDEPENDENT_AMBULATORY_CARE_PROVIDER_SITE_OTHER): Payer: Medicare Other

## 2023-03-31 DIAGNOSIS — Z0000 Encounter for general adult medical examination without abnormal findings: Secondary | ICD-10-CM

## 2023-03-31 NOTE — Progress Notes (Signed)
 Subjective:   Andrew Jones is a 83 y.o. male who presents for Medicare Annual/Subsequent preventive examination.  Visit Complete: Virtual I connected with  Andrew Jones on 03/31/23 by a audio enabled telemedicine application and verified that I am speaking with the correct person using two identifiers.  Interactive audio and video telecommunications were attempted between this provider and patient, however failed, due to patient having technical difficulties OR patient did not have access to video capability.  We continued and completed visit with audio only.   Patient Location: Home  Provider Location: Office/Clinic  I discussed the limitations of evaluation and management by telemedicine. The patient expressed understanding and agreed to proceed.  Vital Signs: Because this visit was a virtual/telehealth visit, some criteria may be missing or patient reported. Any vitals not documented were not able to be obtained and vitals that have been documented are patient reported.    Cardiac Risk Factors include: advanced age (>97men, >48 women);diabetes mellitus;hypertension;male gender     Objective:    Today's Vitals   There is no height or weight on file to calculate BMI.     03/31/2023    2:20 PM 07/12/2022   11:50 AM 07/05/2022    2:20 PM 03/29/2022    4:17 PM 11/29/2021   11:30 AM 03/28/2021    3:15 PM 11/22/2020    2:52 PM  Advanced Directives  Does Patient Have a Medical Advance Directive? Yes Yes Yes Yes Yes Yes Yes  Type of Estate Agent of [place];Living will Healthcare Power of [place];Living will Healthcare Power of [place];Living will Healthcare Power of [place];Living will Healthcare Power of [place];Living will Healthcare Power of [place];Living will Living will;Healthcare Power of Attorney  Does patient want to make changes to medical advance directive?  No - Patient declined   No - Patient declined  No - Patient declined  Copy of Healthcare  Power of Attorney in Chart? No - copy requested No - copy requested No - copy requested No - copy requested Yes - validated most recent copy scanned in chart (See row information) No - copy requested No - copy requested    Current Medications (verified) Outpatient Encounter Medications as of 03/31/2023  Medication Sig   Alirocumab 75 MG/ML SOAJ Inject 75 mg into the skin every 14 (fourteen) days.   amLODipine  (NORVASC ) 5 MG tablet Take 1 tablet (5 mg total) by mouth daily.   aspirin  EC 81 MG tablet Take 1 tablet (81 mg total) by mouth daily. Swallow whole.   benazepril  (LOTENSIN ) 10 MG tablet Take 10 mg by mouth daily.   Calcium -Cholecalciferol (QC CALCIUM  500MG -D3) 500-5 MG-MCG TABS Take 1 tablet by mouth in the morning and at bedtime.   Cholecalciferol (VITAMIN D3) 125 MCG (5000 UT) CAPS Take 1 capsule (5,000 Units total) by mouth daily.   empagliflozin  (JARDIANCE ) 25 MG TABS tablet Take 1 tablet (25 mg total) by mouth daily.   etanercept (ENBREL) 50 MG/ML injection Inject 50 mg into the skin once a week.   Magnesium  Oxide 420 MG TABS Take 420 mg by mouth daily after breakfast.   metFORMIN  (GLUCOPHAGE ) 1000 MG tablet Take 1,000 mg by mouth 2 (two) times daily with a meal.   metoprolol  tartrate (LOPRESSOR ) 25 MG tablet Take 25 mg by mouth 2 (two) times daily.   Multiple Vitamin (MULTIVITAMIN) tablet Take 1 tablet by mouth daily.   Omega-3 Fatty Acids (FISH OIL) 1000 MG CAPS Take by mouth.   pregabalin  (LYRICA ) 75 MG capsule  Take 1 capsule (75 mg total) by mouth 2 (two) times daily.   RESTASIS  0.05 % ophthalmic emulsion Place 1 drop into both eyes 2 (two) times daily.   Semaglutide,0.25 or 0.5MG /DOS, 2 MG/3ML SOPN Inject 0.5 mg into the skin every 7 (seven) days.   Na Sulfate-K Sulfate-Mg Sulf 17.5-3.13-1.6 GM/177ML SOLN TAKE AS DIRECTED BY PHYSICIAN OR PACKAGE INSTRUCTIONS. (Patient not taking: Reported on 03/31/2023)   No facility-administered encounter medications on file as of 03/31/2023.     Allergies (verified) Simvastatin   History: Past Medical History:  Diagnosis Date   Anemia due to GI blood loss 08/25/2022   Ankylosing spondylitis (HCC)    back, hips - on embrel 2000-2008, resumed 12/2012   CVA (cerebral vascular accident) (HCC) 1999, 2002   no residual problems   Diabetes mellitus, type 2 (HCC)    Dyslipidemia    Hypertension    Neuropathy    Bilateral Hands/Feet   Osteoarthritis    Seizure disorder (HCC) 05/23/2009   Sleep apnea    uses c-pap   Past Surgical History:  Procedure Laterality Date   ANTERIOR CERVICAL DECOMP/DISCECTOMY FUSION N/A 07/12/2022   Procedure: Anterior Cervical Decompression/Discectomy Fusion Cervical Five-Six, Cervical Six-Seven;  Surgeon: Lanis Pupa, MD;  Location: [facility] OR;  Service: Neurosurgery;  Laterality: N/A;  3C   CATARACT EXTRACTION W/ INTRAOCULAR LENS IMPLANT Bilateral 2022   COLONOSCOPY  2015   INGUINAL HERNIA REPAIR Left 1947   Family History  Problem Relation Age of Onset   Heart failure Mother    Heart disease Mother    COPD Father    Prostate cancer Brother    Colon cancer Neg Hx    Stomach cancer Neg Hx    Colon polyps Neg Hx    Rectal cancer Neg Hx    Esophageal cancer Neg Hx    Social History   Socioeconomic History   Marital status: Married    Spouse name: Not on file   Number of children: 1   Years of education: 13   Highest education level: 12th grade  Occupational History   Occupation: Retired   Occupation: retired  Tobacco Use   Smoking status: Former    Current packs/day: 0.00    Types: Cigarettes    Quit date: 1994    Years since quitting: 31.0   Smokeless tobacco: Never   Tobacco comments:    30 yrs ago  Vaping Use   Vaping status: Never Used  Substance and Sexual Activity   Alcohol use: Not Currently   Drug use: No   Sexual activity: Yes  Other Topics Concern   Not on file  Social History Narrative   Lives with wife   Right Handed   Drinks 2-3 cups caffeine daily    Social Drivers of Health   Financial Resource Strain: Low Risk  (03/31/2023)   Overall Financial Resource Strain (CARDIA)    Difficulty of Paying Living Expenses: Not hard at all  Food Insecurity: No Food Insecurity (03/31/2023)   Hunger Vital Sign    Worried About Running Out of Food in the Last Year: Never true    Ran Out of Food in the Last Year: Never true  Transportation Needs: No Transportation Needs (03/31/2023)   PRAPARE - Administrator, Civil Service (Medical): No    Lack of Transportation (Non-Medical): No  Physical Activity: Insufficiently Active (03/31/2023)   Exercise Vital Sign    Days of Exercise per Week: 5 days    Minutes of  Exercise per Session: 20 min  Stress: No Stress Concern Present (03/31/2023)   [employer] of Occupational Health - Occupational Stress Questionnaire    Feeling of Stress : Not at all  Social Connections: Moderately Isolated (03/31/2023)   Social Connection and Isolation Panel [NHANES]    Frequency of Communication with Friends and Family: More than three times a week    Frequency of Social Gatherings with Friends and Family: More than three times a week    Attends Religious Services: Never    Database Administrator or Organizations: No    Attends Engineer, Structural: Never    Marital Status: Married    Tobacco Counseling Counseling given: Not Answered Tobacco comments: 30 yrs ago   Clinical Intake:  Pre-visit preparation completed: Yes  Pain : No/denies pain     Nutritional Risks: None Diabetes: Yes CBG done?: No Did pt. bring in CBG monitor from home?: No  How often do you need to have someone help you when you read instructions, pamphlets, or other written materials from your doctor or pharmacy?: 1 - Never  Interpreter Needed?: No  Information entered by :: NAllen LPN   Activities of Daily Living    03/31/2023    2:16 PM 07/05/2022    2:22 PM  In your present state of health, do you have any  difficulty performing the following activities:  Hearing? 1   Comment wears hearing aids   Vision? 0   Difficulty concentrating or making decisions? 0   Walking or climbing stairs? 0   Dressing or bathing? 0   Doing errands, shopping? 0 0  Preparing Food and eating ? N   Using the Toilet? N   In the past six months, have you accidently leaked urine? N   Do you have problems with loss of bowel control? N   Managing your Medications? N   Managing your Finances? N   Housekeeping or managing your Housekeeping? N     Patient Care Team: Nche, Roselie Rockford, NP as PCP - General (Internal Medicine) Rosemarie Eather RAMAN, MD (Neurology) [place]man, Heather, OD (Optometry) Abran Norleen SAILOR, MD (Gastroenterology)  Indicate any recent Medical Services you may have received from other than [facility] providers in the past year (date may be approximate).     Assessment:   This is a routine wellness examination for Andrew Jones.  Hearing/Vision screen Hearing Screening - Comments:: Has hearing aids that are maintained Vision Screening - Comments:: Regular eye exams, [facility]   Goals Addressed             This Visit's Progress    Patient Stated       03/31/2023, staying alive       Depression Screen    03/31/2023    2:21 PM 03/04/2023    8:55 AM 09/03/2022   10:27 AM 05/06/2022   10:33 AM 03/29/2022    4:18 PM 07/25/2021    9:06 AM 03/28/2021    3:21 PM  PHQ 2/9 Scores  PHQ - 2 Score 0 0 0 0 0 0 0  PHQ- 9 Score      1     Fall Risk    03/31/2023    2:21 PM 03/04/2023    8:55 AM 05/06/2022   10:33 AM 03/29/2022    4:17 PM 03/28/2021    3:18 PM  Fall Risk   Falls in the past year? 0 0  0 0  Number falls in past yr: 0 0 0  0 0  Injury with Fall? 0 0 0 0 0  Risk for fall due to : Medication side effect No Fall Risks  Medication side effect;Impaired balance/gait;Impaired mobility   Follow up Falls prevention discussed;Falls evaluation completed Falls evaluation completed  Falls prevention discussed;Falls  evaluation completed;Education provided Falls evaluation completed    MEDICARE RISK AT HOME: Medicare Risk at Home Any stairs in or around the home?: No If so, are there any without handrails?: No Home free of loose throw rugs in walkways, pet beds, electrical cords, etc?: Yes Adequate lighting in your home to reduce risk of falls?: Yes Life alert?: No Use of a cane, walker or w/c?: No Grab bars in the bathroom?: Yes Shower chair or bench in shower?: Yes Elevated toilet seat or a handicapped toilet?: Yes  TIMED UP AND GO:  Was the test performed?  No    Cognitive Function:        03/31/2023    2:21 PM 03/29/2022    4:19 PM  6CIT Screen  What Year? 0 points 0 points  What month? 0 points 0 points  What time? 0 points 0 points  Count back from 20 0 points 0 points  Months in reverse 0 points 0 points  Repeat phrase 0 points 0 points  Total Score 0 points 0 points    Immunizations Immunization History  Administered Date(s) Administered   Fluad Quad(high Dose 65+) 12/23/2018, 12/26/2020   Influenza Split 12/24/2011, 12/24/2014, 12/23/2016   Influenza, High Dose Seasonal PF 12/23/2012, 12/25/2013, 12/20/2014, 12/28/2016, 11/30/2017   Influenza-Unspecified 12/24/2003, 12/23/2004, 12/23/2005, 01/24/2007, 12/24/2007, 01/23/2009, 07/17/2010, 12/24/2010, 01/08/2012, 01/07/2019, 01/07/2020, 12/26/2020, 12/27/2021   Moderna Covid-19 Fall Seasonal Vaccine 55yrs & older 12/27/2021   Moderna Covid-19 Vaccine Bivalent Booster 36yrs & up 01/10/2021   Moderna Sars-Covid-2 Vaccination 04/06/2019, 04/07/2019, 04/27/2019, 05/04/2019, 05/18/2019, 12/27/2019, 09/07/2020   PPD Test 08/14/2021   Pneumococcal Conjugate-13 05/23/2008, 10/26/2013   Pneumococcal Polysaccharide-23 10/23/2012   Pneumococcal-Unspecified 02/24/2004, 11/01/2013   Tdap 06/29/2011, 09/16/2011   Zoster Recombinant(Shingrix) 01/10/2021, 04/11/2022   Zoster, Live 10/23/2012, 03/26/2015    TDAP status: Due, Education has  been provided regarding the importance of this vaccine. Advised may receive this vaccine at local pharmacy or Health Dept. Aware to provide a copy of the vaccination record if obtained from local pharmacy or Health Dept. Verbalized acceptance and understanding.  Flu Vaccine status: Up to date  Pneumococcal vaccine status: Up to date  Covid-19 vaccine status: Completed vaccines  Qualifies for Shingles Vaccine? Yes   Zostavax completed Yes   Shingrix Completed?: Yes  Screening Tests Health Maintenance  Topic Date Due   OPHTHALMOLOGY EXAM  07/17/2022   COVID-19 Vaccine (11 - 2024-25 season) 02/10/2023   HEMOGLOBIN A1C  04/20/2023   FOOT EXAM  09/03/2023   Diabetic kidney evaluation - Urine ACR  10/18/2023   Diabetic kidney evaluation - eGFR measurement  11/01/2023   Medicare Annual Wellness (AWV)  03/30/2024   Pneumonia Vaccine 33+ Years old  Completed   INFLUENZA VACCINE  Completed   Zoster Vaccines- Shingrix  Completed   HPV VACCINES  Aged Out   DTaP/Tdap/Td  Discontinued   Colonoscopy  Discontinued   Hepatitis C Screening  Discontinued    Health Maintenance  Health Maintenance Due  Topic Date Due   OPHTHALMOLOGY EXAM  07/17/2022   COVID-19 Vaccine (11 - 2024-25 season) 02/10/2023    Colorectal cancer screening: No longer required.   Lung Cancer Screening: (Low Dose CT Chest recommended if Age 73-80 years, 48  pack-year currently smoking OR have quit w/in 15years.) does not qualify.   Lung Cancer Screening Referral: no  Additional Screening:  Hepatitis C Screening: does not qualify;   Vision Screening: Recommended annual ophthalmology exams for early detection of glaucoma and other disorders of the eye. Is the patient up to date with their annual eye exam?  Yes  Who is the provider or what is the name of the office in which the patient attends annual eye exams? [facility] If pt is not established with a provider, would they like to be referred to a provider to  establish care? No .   Dental Screening: Recommended annual dental exams for proper oral hygiene  Diabetic Foot Exam: Diabetic Foot Exam: Completed 09/03/2022  Community Resource Referral / Chronic Care Management: CRR required this visit?  No   CCM required this visit?  No     Plan:     I have personally reviewed and noted the following in the patient's chart:   Medical and social history Use of alcohol, tobacco or illicit drugs  Current medications and supplements including opioid prescriptions. Patient is not currently taking opioid prescriptions. Functional ability and status Nutritional status Physical activity Advanced directives List of other physicians Hospitalizations, surgeries, and ER visits in previous 12 months Vitals Screenings to include cognitive, depression, and falls Referrals and appointments  In addition, I have reviewed and discussed with patient certain preventive protocols, quality metrics, and best practice recommendations. A written personalized care plan for preventive services as well as general preventive health recommendations were provided to patient.     Ardella FORBES Dawn, LPN   10/25/7972   After Visit Summary: (MyChart) Due to this being a telephonic visit, the after visit summary with patients personalized plan was offered to patient via MyChart   Nurse Notes: none

## 2023-03-31 NOTE — Patient Instructions (Signed)
 [name] , Thank you for taking time to come for your Medicare Wellness Visit. I appreciate your ongoing commitment to your health goals. Please review the following plan we discussed and let me know if I can assist you in the future.   Referrals/Orders/Follow-Ups/Clinician Recommendations: none  This is a list of the screening recommended for you and due dates:  Health Maintenance  Topic Date Due   Eye exam for diabetics  07/17/2022   COVID-19 Vaccine (11 - 2024-25 season) 02/10/2023   Hemoglobin A1C  04/20/2023   Complete foot exam   09/03/2023   Yearly kidney health urinalysis for diabetes  10/18/2023   Yearly kidney function blood test for diabetes  11/01/2023   Medicare Annual Wellness Visit  03/30/2024   Pneumonia Vaccine  Completed   Flu Shot  Completed   Zoster (Shingles) Vaccine  Completed   HPV Vaccine  Aged Out   DTaP/Tdap/Td vaccine  Discontinued   Colon Cancer Screening  Discontinued   Hepatitis C Screening  Discontinued    Advanced directives: (Copy Requested) Please bring a copy of your health care power of attorney and living will to the office to be added to your chart at your convenience.  Next Medicare Annual Wellness Visit scheduled for next year: Yes  insert Preventive Care attachment Insert FALL PREVENTION attachment if needed

## 2023-04-18 ENCOUNTER — Encounter: Payer: Self-pay | Admitting: Nurse Practitioner

## 2023-09-08 ENCOUNTER — Ambulatory Visit: Payer: Medicare Other | Admitting: Nurse Practitioner

## 2023-09-24 ENCOUNTER — Other Ambulatory Visit: Payer: Self-pay | Admitting: Nurse Practitioner

## 2023-09-24 ENCOUNTER — Ambulatory Visit: Admitting: Nurse Practitioner

## 2023-09-24 DIAGNOSIS — G629 Polyneuropathy, unspecified: Secondary | ICD-10-CM

## 2023-09-24 DIAGNOSIS — K080 Exfoliation of teeth due to systemic causes: Secondary | ICD-10-CM | POA: Diagnosis not present

## 2023-09-24 NOTE — Telephone Encounter (Signed)
 Medication: Pregabalin  (Lyrica ) 75 mg  Directions: Take 1 tablet by mouth 2 times a day  Last given: 03/04/23 Number refills: 1 Last o/v: 03/04/23 Follow up: 6 months (around 09/02/23)-scheduled for 10/07/23 Labs:   PDMP viewed by Katheen Roselie Rockford, NP on 03/04/2023 at 9:18 AM

## 2023-10-07 ENCOUNTER — Ambulatory Visit: Admitting: Nurse Practitioner

## 2023-10-07 ENCOUNTER — Encounter: Payer: Self-pay | Admitting: Nurse Practitioner

## 2023-10-07 VITALS — BP 110/56 | HR 72 | Temp 97.9°F | Ht 67.0 in | Wt 170.4 lb

## 2023-10-07 DIAGNOSIS — Z7985 Long-term (current) use of injectable non-insulin antidiabetic drugs: Secondary | ICD-10-CM

## 2023-10-07 DIAGNOSIS — G40909 Epilepsy, unspecified, not intractable, without status epilepticus: Secondary | ICD-10-CM | POA: Diagnosis not present

## 2023-10-07 DIAGNOSIS — Z7984 Long term (current) use of oral hypoglycemic drugs: Secondary | ICD-10-CM

## 2023-10-07 DIAGNOSIS — I10 Essential (primary) hypertension: Secondary | ICD-10-CM

## 2023-10-07 DIAGNOSIS — E0841 Diabetes mellitus due to underlying condition with diabetic mononeuropathy: Secondary | ICD-10-CM

## 2023-10-07 DIAGNOSIS — N400 Enlarged prostate without lower urinary tract symptoms: Secondary | ICD-10-CM

## 2023-10-07 DIAGNOSIS — E1169 Type 2 diabetes mellitus with other specified complication: Secondary | ICD-10-CM | POA: Diagnosis not present

## 2023-10-07 DIAGNOSIS — E559 Vitamin D deficiency, unspecified: Secondary | ICD-10-CM

## 2023-10-07 DIAGNOSIS — M456 Ankylosing spondylitis lumbar region: Secondary | ICD-10-CM | POA: Diagnosis not present

## 2023-10-07 NOTE — Assessment & Plan Note (Signed)
 Pain controlled with lyrica  Blister with skin intact noted on left 1st MTP joint. Advised about proper skin care and need for another appointment if blister does not resolve in 10days

## 2023-10-07 NOTE — Patient Instructions (Addendum)
 Repeat vit. D, hgbA1c, lipid panel, CBC, PSA, and CMP. Send lab results via mychart.  Use triple antibiotic ointment 1-2x/day on wound Schedule in person appointment if wound does not heal in 10days  Monitor BP daily in AM Stop amlodipine  Continue benazepril  and metoprolol  BP goal is 120/70 or less Call office if BP 130/80 or greater Send BP readings via mychart in 2weeks

## 2023-10-07 NOTE — Assessment & Plan Note (Addendum)
 [facility]. Last hgbA1c at 5.8%, normal UACr (03/2023) Current use of metformin  1000mg  BID and ozempic 0.5mg  Advised to schedule annual DIABETES eye exam. LDL at goal  Maintain med doses F/up in 6months

## 2023-10-07 NOTE — Progress Notes (Signed)
 Established Patient Visit  Patient: Andrew Jones   DOB: 1940-04-05   83 y.o. 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 Advised about proper skin care and need for another appointment if blister does not resolve in 10days  Seizure disorder No seizure since 2011 No seizure med since 2022   DM (diabetes mellitus) (HCC) [facility], C[facility], and lipid panel next week. Last hgbA1c at 5.8%, normal UACr (03/2023) Current use of metformin  1000mg  BID and ozempic 0.5mg  Advised to schedule annual DIABETES eye exam. LDL at goal  Maintain med doses F/up in 6months  Benign prostatic hyperplasia Denies any urinary symptoms repeat PSA  Vitamin D  deficiency [facility] Oral supplement on hold due to previous high level  Hypertension Low BP noted today, asymptomatic Current use of amlodipine  5mg , benazepril , and metoprolol  BP Readings from Last 3 Encounters:  10/07/23 (!) 110/56  03/04/23 (!) 110/54  11/01/22 (!) 124/59    Monitor BP daily in AM Stop amlodipine  Continue benazepril  and metoprolol  BP goal is 120/70 or less Call office if BP 130/80 or greater Send BP readings via mychart in 2weeks  Participates in daily walking and strengthening exercise.  Reviewed medical, surgical, and social history today  Medications: Outpatient Medications Prior to Visit  Medication Sig   Alirocumab 75 MG/ML SOAJ Inject 75 mg into the skin every 14 (fourteen) days.   aspirin  EC 81 MG tablet Take 1 tablet (81 mg total) by mouth daily. Swallow whole.   benazepril  (LOTENSIN ) 10 MG tablet Take 10 mg by mouth daily.   Calcium -Cholecalciferol (QC  CALCIUM  500MG -D3) 500-5 MG-MCG TABS Take 1 tablet by mouth in the morning and at bedtime.   empagliflozin  (JARDIANCE ) 25 MG TABS tablet Take 1 tablet (25 mg total) by mouth daily.   etanercept (ENBREL) 50 MG/ML injection Inject 50 mg into the skin once a week. Every 10days   Magnesium  Oxide 420 MG TABS Take 420 mg by mouth daily after breakfast.   metFORMIN  (GLUCOPHAGE ) 1000 MG tablet Take 1,000 mg by mouth 2 (two) times daily with a meal.   metoprolol  tartrate (LOPRESSOR ) 25 MG tablet Take 25 mg by mouth 2 (two) times daily.   Multiple Vitamin (MULTIVITAMIN) tablet Take 1 tablet by mouth daily.   Omega-3 Fatty Acids (FISH OIL) 1000 MG CAPS Take by mouth.   pregabalin  (LYRICA ) 75 MG capsule TAKE 1 CAPSULE BY MOUTH 2 TIMES DAILY   RESTASIS  0.05 % ophthalmic emulsion Place 1 drop into both eyes 2 (two) times daily.   Semaglutide,0.25 or 0.5MG /DOS, 2 MG/3ML SOPN Inject 0.5 mg into the skin every 7 (seven) days.   [DISCONTINUED] amLODipine  (NORVASC ) 5 MG tablet Take 1 tablet (5 mg total) by mouth daily.   [DISCONTINUED] Cholecalciferol (VITAMIN D3) 125 MCG (5000 UT) CAPS Take 1 capsule (5,000 Units total) by mouth daily. (Patient not taking: Reported on 10/07/2023)   [DISCONTINUED] Na Sulfate-K Sulfate-Mg Sulf 17.5-3.13-1.6 GM/177ML SOLN TAKE AS DIRECTED BY PHYSICIAN OR PACKAGE INSTRUCTIONS. (Patient not taking: Reported on 03/31/2023)   No facility-administered medications prior to visit.   Reviewed past  medical and social history.   ROS per HPI above      Objective:  BP (!) 110/56 (BP Location: Left Arm, Patient Position: Sitting, Cuff Size: Normal)   Pulse 72   Temp 97.9 F (36.6 C) (Oral)   Ht 5' 7 (1.702 m)   Wt 170 lb 6.4 oz (77.3 kg)   SpO2 97%   BMI 26.69 kg/m      Physical Exam Vitals and nursing note reviewed.  Cardiovascular:     Rate and Rhythm: Normal rate and regular rhythm.     Pulses: Normal pulses.          Dorsalis pedis pulses are 2+ on the right side and 2+ on  the left side.       Posterior tibial pulses are 2+ on the right side and 2+ on the left side.     Heart sounds: Normal heart sounds.  Pulmonary:     Effort: Pulmonary effort is normal.     Breath sounds: Normal breath sounds.  Musculoskeletal:     Right lower leg: No edema.     Left lower leg: No edema.       Feet:  Feet:     Right foot:     Skin integrity: No ulcer, skin breakdown or erythema.     Toenail Condition: Right toenails are normal.     Left foot:     Skin integrity: Blister present. No ulcer, skin breakdown or erythema.     Toenail Condition: Left toenails are normal.  Neurological:     Mental Status: He is alert and oriented to person, place, and time.     No results found for any visits on 10/07/23.    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 Advised about proper skin care and need for another appointment if blister does not resolve in 10days      DM (diabetes mellitus) (HCC) - Primary   [facility], and lipid panel next week. Last hgbA1c at 5.8%, normal UACr (03/2023) Current use of metformin  1000mg  BID and ozempic 0.5mg  Advised to schedule annual DIABETES eye exam. LDL at goal  Maintain med doses F/up in 6months      Hypertension   Low BP noted today, asymptomatic Current use of amlodipine  5mg , benazepril , and metoprolol  BP Readings from Last 3 Encounters:  10/07/23 (!) 110/56  03/04/23 (!) 110/54  11/01/22 (!) 124/59    Monitor BP daily in AM Stop amlodipine  Continue benazepril  and metoprolol  BP goal is 120/70 or less Call office if BP 130/80 or greater Send BP readings via mychart in 2weeks      Seizure disorder (HCC)   No seizure  since 2011 No seizure med since 2022       Vitamin D  deficiency   [facility] Oral supplement on hold due to previous high level      Other Visit Diagnoses       Ankylosing spondylitis of lumbar region [facility])   (Chronic)        Return in about 6 months (around 04/08/2024) for HTN, DM, hyperlipidemia (fasting).     Roselie Mood, NP

## 2023-10-07 NOTE — Assessment & Plan Note (Signed)
 No seizure since 2011 No seizure med since 2022

## 2023-10-07 NOTE — Assessment & Plan Note (Signed)
 Low BP noted today, asymptomatic Current use of amlodipine  5mg , benazepril , and metoprolol  BP Readings from Last 3 Encounters:  10/07/23 (!) 110/56  03/04/23 (!) 110/54  11/01/22 (!) 124/59    Monitor BP daily in AM Stop amlodipine  Continue benazepril  and metoprolol  BP goal is 120/70 or less Call office if BP 130/80 or greater Send BP readings via mychart in 2weeks

## 2023-10-07 NOTE — Assessment & Plan Note (Signed)
 Denies any urinary symptoms repeat PSA

## 2023-10-07 NOTE — Assessment & Plan Note (Signed)
 [facility] D [facility] Oral supplement on hold due to previous high level

## 2023-10-13 LAB — PSA: PSA: 8.8

## 2023-10-13 LAB — BASIC METABOLIC PANEL WITH GFR
BUN: 13 (ref 4–21)
CO2: 28 — AB (ref 13–22)
Chloride: 101 (ref 99–108)
Creatinine: 0.9 (ref 0.6–1.3)
Glucose: 99
Potassium: 5.1 meq/L (ref 3.5–5.1)
Sodium: 134 — AB (ref 137–147)

## 2023-10-13 LAB — CBC AND DIFFERENTIAL
HCT: 40 — AB (ref 41–53)
Hemoglobin: 13.7 (ref 13.5–17.5)
Platelets: 320 K/uL (ref 150–400)
WBC: 13.4

## 2023-10-13 LAB — PROTEIN / CREATININE RATIO, URINE
Albumin, U: <0.3
Creatinine, Urine: 33.5

## 2023-10-13 LAB — HEPATIC FUNCTION PANEL
ALT: 19 U/L (ref 10–40)
AST: 22 (ref 14–40)
Alkaline Phosphatase: 71 (ref 25–125)
Bilirubin, Direct: 0.2 (ref 0.01–0.4)
Bilirubin, Total: 0.7

## 2023-10-13 LAB — HEMOGLOBIN A1C: Hemoglobin A1C: 5.6

## 2023-10-13 LAB — MICROALBUMIN / CREATININE URINE RATIO
Albumin, Urine POC: 0.3
Creatinine, POC: 33.5 mg/dL

## 2023-10-13 LAB — LIPID PANEL
Cholesterol: 123 (ref 0–200)
HDL: 45 (ref 35–70)
LDL Cholesterol: 62
Triglycerides: 188 — AB (ref 40–160)

## 2023-10-13 LAB — MICROALBUMIN, URINE

## 2023-10-13 LAB — VITAMIN D 25 HYDROXY (VIT D DEFICIENCY, FRACTURES): Vit D, 25-Hydroxy: 47.3

## 2023-10-13 LAB — COMPREHENSIVE METABOLIC PANEL WITH GFR
Albumin: 4.5 (ref 3.5–5.0)
Calcium: 9.8 (ref 8.7–10.7)

## 2023-10-13 LAB — TSH: TSH: 2.39 (ref 0.41–5.90)

## 2023-10-13 LAB — CBC: RBC: 4.7 (ref 3.87–5.11)

## 2023-10-13 LAB — VITAMIN B12: Vitamin B-12: 551

## 2023-10-15 ENCOUNTER — Encounter: Payer: Self-pay | Admitting: Nurse Practitioner

## 2023-10-21 ENCOUNTER — Encounter: Payer: Self-pay | Admitting: Nurse Practitioner

## 2023-10-29 ENCOUNTER — Telehealth: Payer: Self-pay | Admitting: Nurse Practitioner

## 2023-10-29 NOTE — Telephone Encounter (Signed)
 Called patient and he informed me that he wanted to talk to [facility] about his PSA levels and the plan [facility] has of [facility]. He also wants to know if his wife can have a sooner appointment with Roselie due to his concern about her panic attacks.

## 2023-10-29 NOTE — Telephone Encounter (Signed)
 When calling to reschedule his wife the pt expressed that he was concerned about his tsa or psa results and that he has not gotten a call from anyone to let him know what is going on. Please call  Signe [phone]

## 2023-10-30 NOTE — Telephone Encounter (Signed)
 Called Andrew Jones and he for his wife scheduled for a video visit for tomorrow 10/31/23 at 1:00 PM

## 2024-01-14 IMAGING — MR MR HEAD W/O CM
9 of 10 series · 37 of 48 positions shown · non-contrast
Comparison: CT angio head and neck 06/20/2021

CLINICAL DATA: Blurred vision and dizziness.

EXAM:
MRI HEAD WITHOUT CONTRAST
TECHNIQUE: Multiplanar, multiecho pulse sequences of the brain and surrounding
structures were obtained without intravenous contrast.

[Series 4: DWI · axial · 3.0mm · 1.09mm/px · z∈[-48,+92]mm · 9 of 96 slices shown (1 of 4)]
[im 1/96]
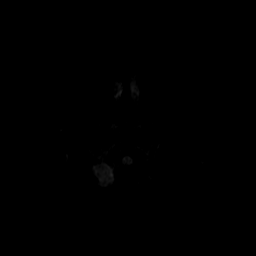
[im 12/96]
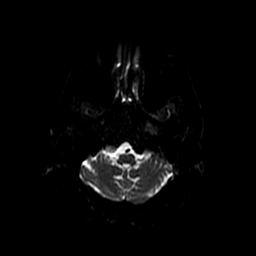
[im 24/96]
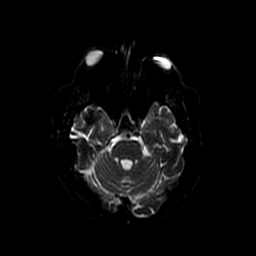
[im 36/96]
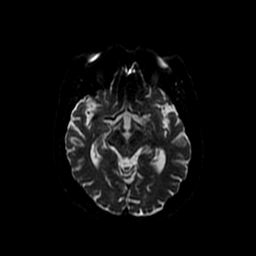
[im 48/96]
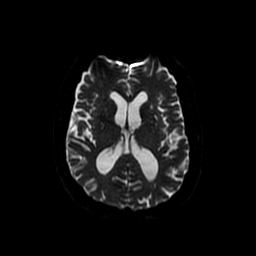
[im 60/96]
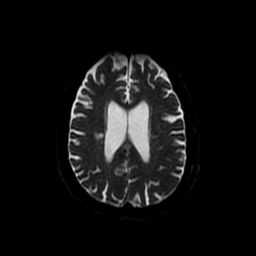
[im 72/96]
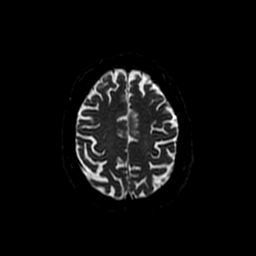
[im 84/96]
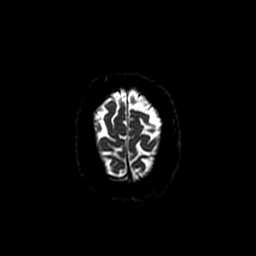
[im 96/96]
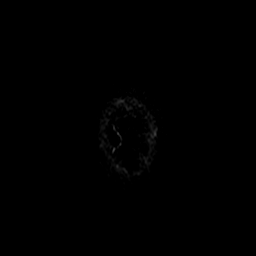

[Series 6: T1 · sagittal · 5.0mm · 0.47mm/px · 2 of 24 slices shown (1 of 2)]
[im 1/24]
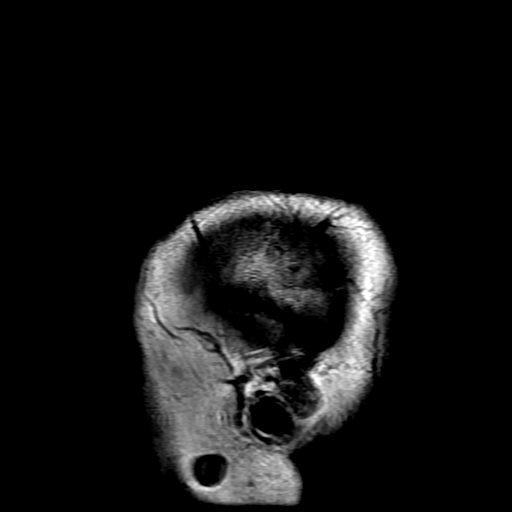
[im 24/24]
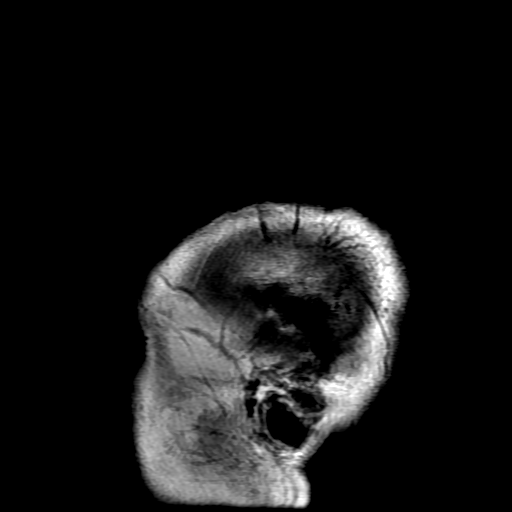

[Series 9: DWI · coronal · 5.0mm · 1.09mm/px · 7 of 72 slices shown (2 of 4)]
[im 1/72]
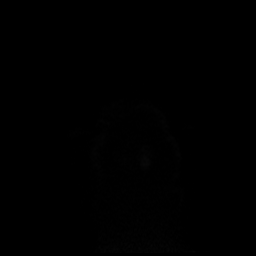
[im 12/72]
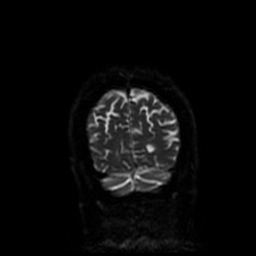
[im 24/72]
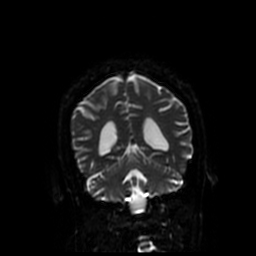
[im 36/72]
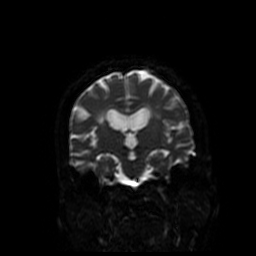
[im 48/72]
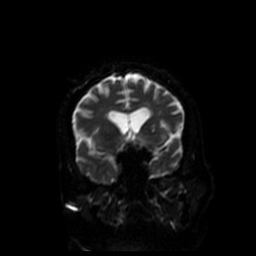
[im 60/72]
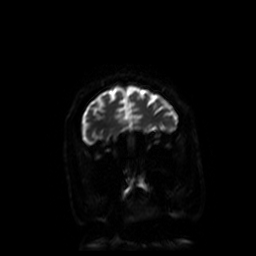
[im 72/72]
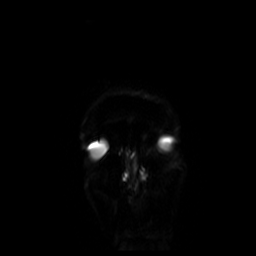

[Series 11: T1 · axial · 3.0mm · 0.47mm/px · 1 of 100 slices shown (2 of 2)]
[im 1/100]
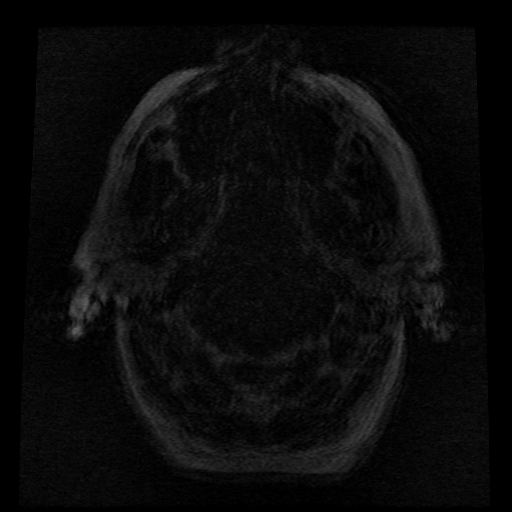

[Series 12: FLAIR · axial · 3.0mm · 0.43mm/px · z∈[-49,+93]mm · 3 of 25 slices shown]
[im 1/25]
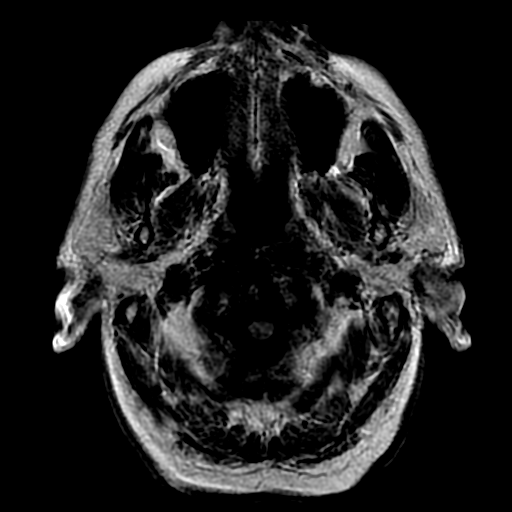
[im 13/25]
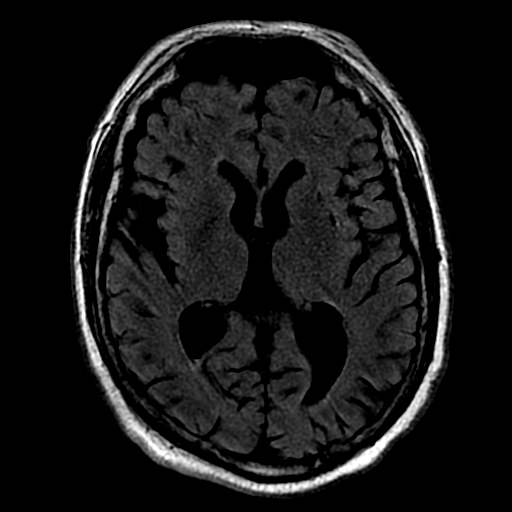
[im 25/25]
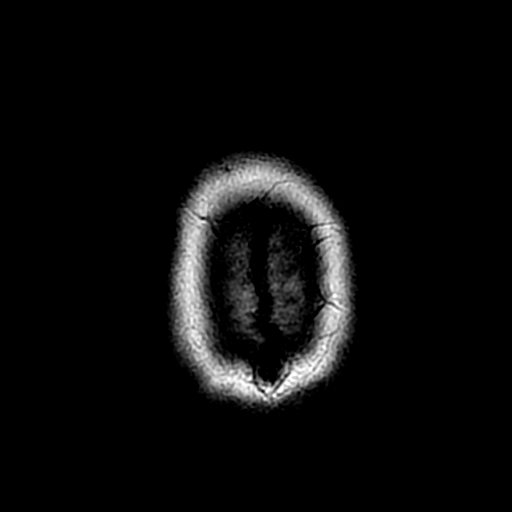

[Series 13: T2 · axial · 5.0mm · 0.43mm/px · z∈[-47,+95]mm · 3 of 25 slices shown (1 of 2)]
[im 1/25]
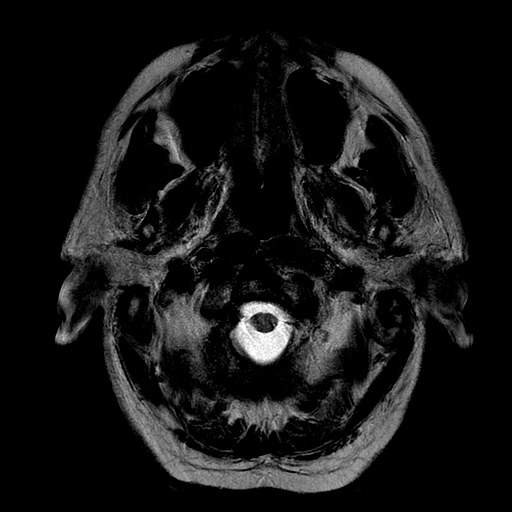
[im 13/25]
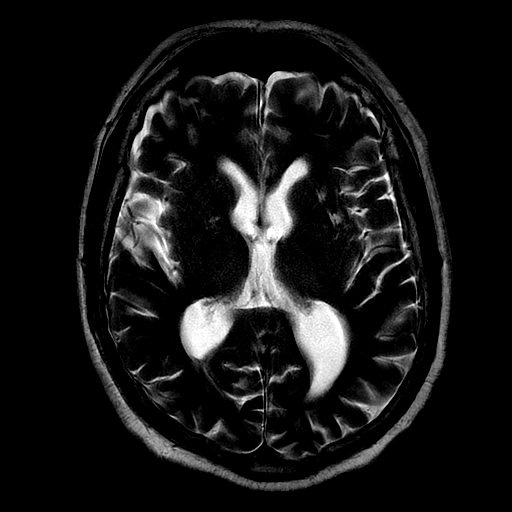
[im 25/25]
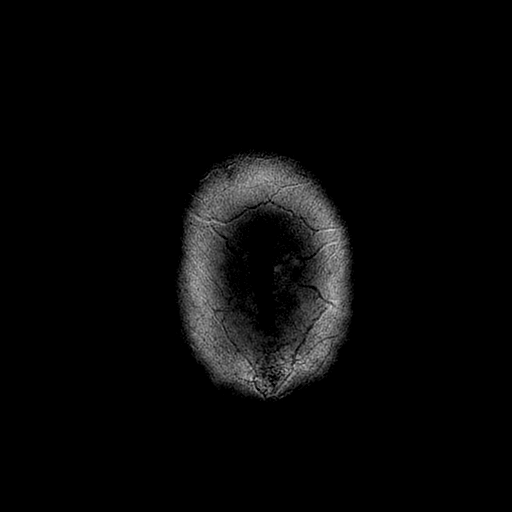

[Series 14: T2 · coronal · 5.0mm · 0.39mm/px · 3 of 28 slices shown (2 of 2)]
[im 1/28]
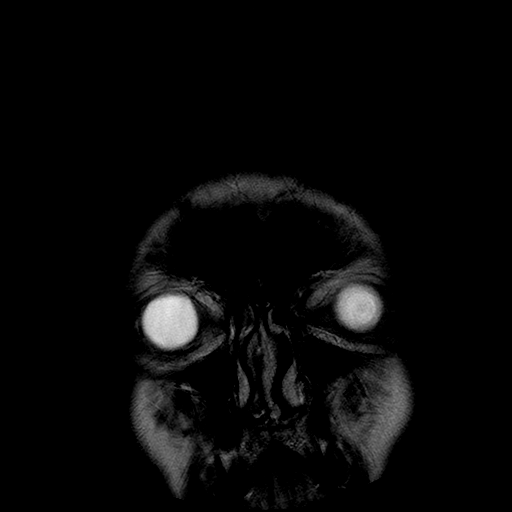
[im 14/28]
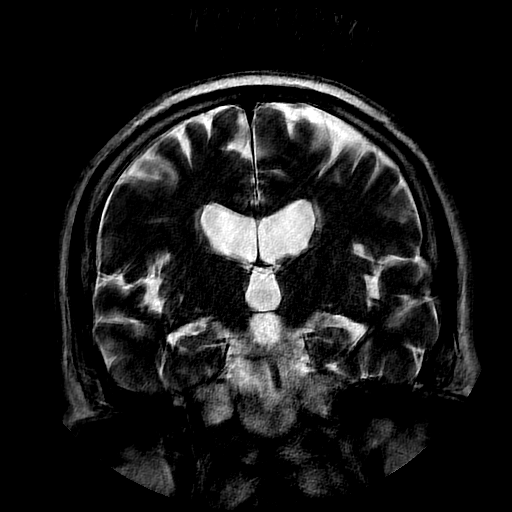
[im 28/28]
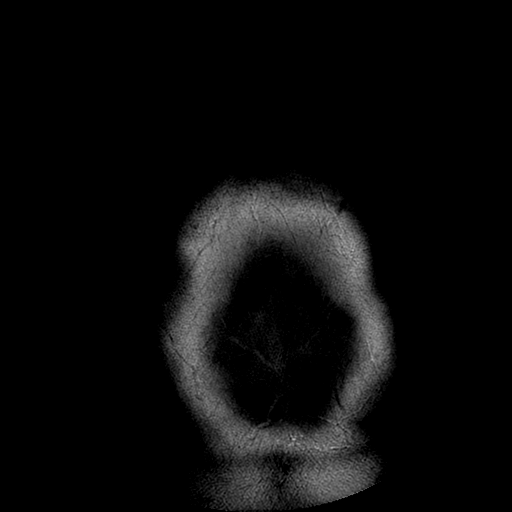

[Series 400: DWI · axial · 3.0mm · 1.09mm/px · z∈[-48,+92]mm · 5 of 48 slices shown (3 of 4)]
[im 1/48]
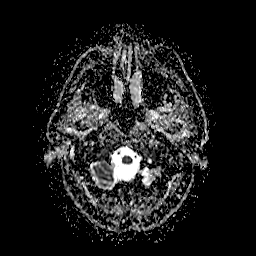
[im 12/48]
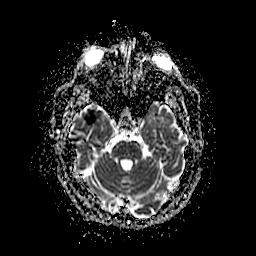
[im 24/48]
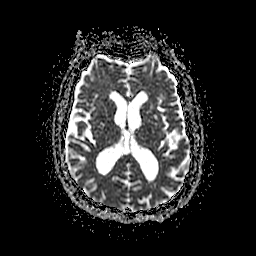
[im 36/48]
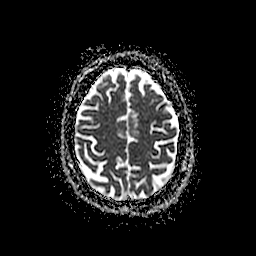
[im 48/48]
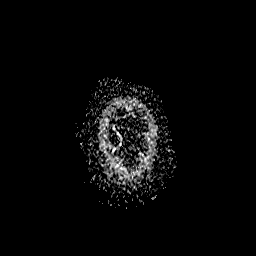

[Series 900: DWI · coronal · 5.0mm · 1.09mm/px · 4 of 36 slices shown (4 of 4)]
[im 1/36]
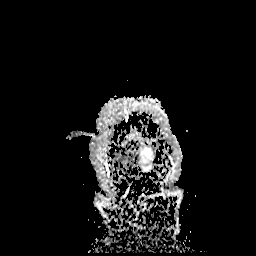
[im 12/36]
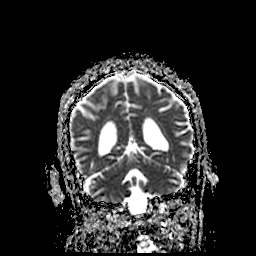
[im 24/36]
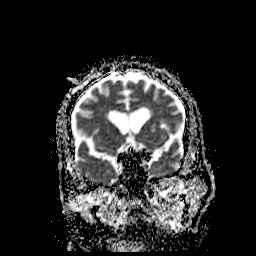
[im 36/36]
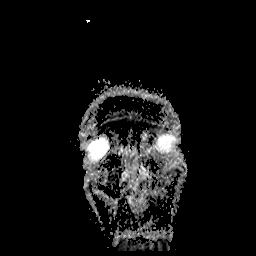

[37 of 48 positions shown; findings below may reference images not displayed]

FINDINGS: Brain: Ventricle size and cerebral volume normal for age. Mild white
matter changes bilaterally.

Negative for acute infarct, hemorrhage, mass. Small chronic infarct
central pons. Mild chronic ischemia in the basal ganglia
bilaterally.

Vascular: Loss of flow void distal left vertebral artery which is
occluded on CTA. Otherwise normal arterial flow voids at the skull
base.

Skull and upper cervical spine: Negative

Sinuses/Orbits: Paranasal sinuses clear. Bilateral cataract
extraction

Other: Motion degraded study
IMPRESSION: Negative for acute infarct.  Mild chronic ischemic change.

## 2024-01-14 IMAGING — CT CT ANGIO HEAD-NECK (W OR W/O PERF)
1 of 11 series · 5 of 33 positions shown · non-contrast
Comparison: CT head 06/14/2019.  MRI head 08/04/2020

CLINICAL DATA: Acute neuro deficit.  Suspect stroke.

EXAM:
CT ANGIOGRAPHY HEAD AND NECK
TECHNIQUE: Multidetector CT imaging of the head and neck was performed using
the standard protocol during bolus administration of intravenous
contrast. Multiplanar CT image reconstructions and MIPs were
obtained to evaluate the vascular anatomy. Carotid stenosis
measurements (when applicable) are obtained utilizing NASCET
criteria, using the distal internal carotid diameter as the
denominator.

[Series 14: ax thins · axial · 0.39mm/px · z∈[-278,-32]mm · 5 of 369 slices shown]
[im 62/369  soft-tissue]
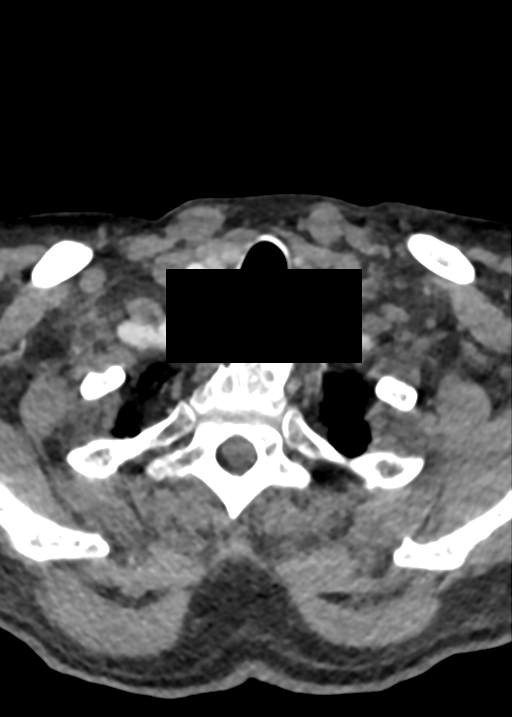
[im 123/369  bone]
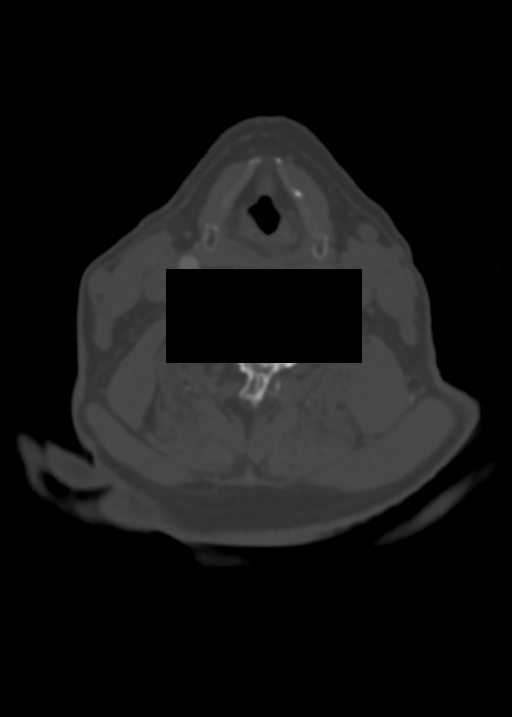
[im 185/369  soft-tissue]
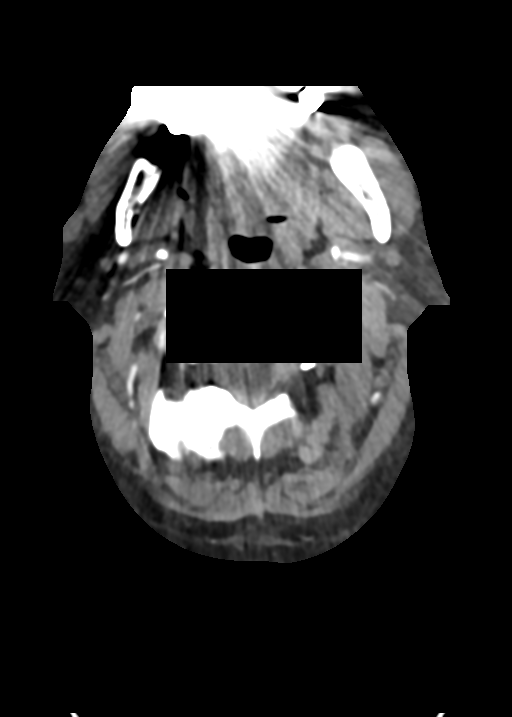
[im 246/369  bone]
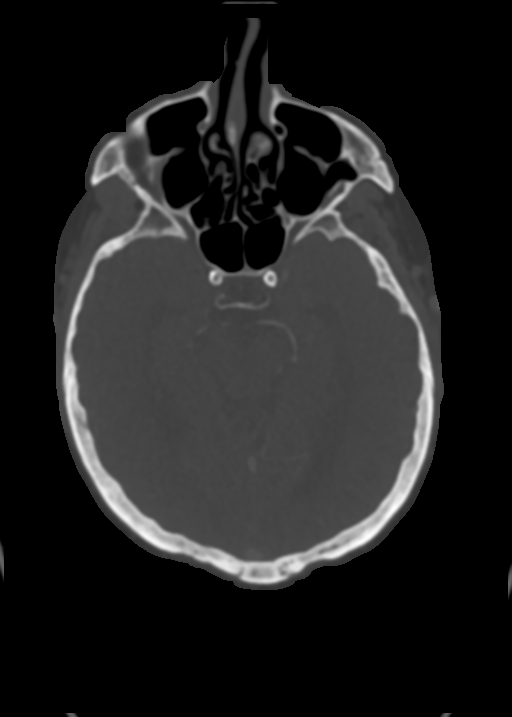
[im 307/369  soft-tissue]
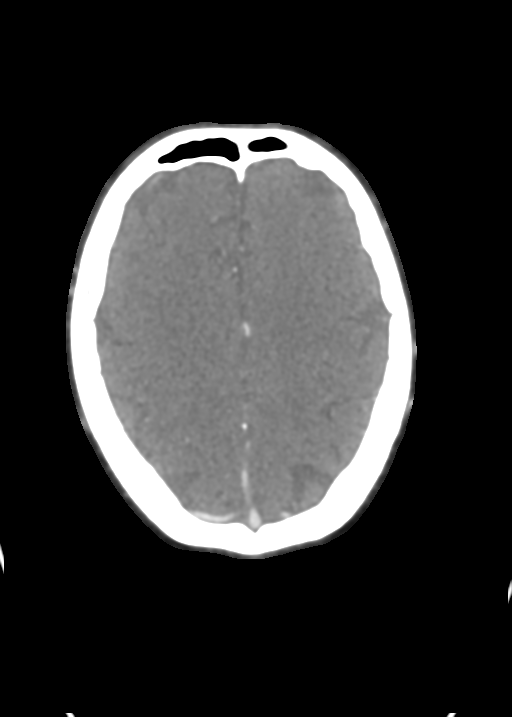

[5 of 33 positions shown; findings below may reference images not displayed]

RADIATION DOSE REDUCTION: This exam was performed according to the
departmental dose-optimization program which includes automated
exposure control, adjustment of the mA and/or kV according to
patient size and/or use of iterative reconstruction technique.

CONTRAST:  75mL OMNIPAQUE IOHEXOL 350 MG/ML SOLN
FINDINGS: CT HEAD FINDINGS

Brain: Ventricle size and cerebral volume normal for age. Mild
patchy white matter hypodensity bilaterally.

Negative for acute cortical infarct, hemorrhage, mass.

Vascular: Negative for hyperdense vessel

Skull: Negative

Sinuses: Paranasal sinuses clear.

Orbits: No orbital lesion.  Bilateral cataract extraction

Review of the MIP images confirms the above findings

CTA NECK FINDINGS

Aortic arch: Atherosclerotic calcification aortic arch. Proximal
great vessels patent.

Right carotid system: Atherosclerotic calcification at the right
carotid bifurcation and proximal right internal carotid artery.
Approximately 40% diameter stenosis right internal carotid artery.
Mild stenosis proximal right external carotid artery

Left carotid system: Atherosclerotic calcification distal left
common carotid artery and proximal left internal carotid artery.
Approximately 40% diameter stenosis proximal left internal carotid
artery

Vertebral arteries: Right vertebral artery dominant. Right vertebral
artery is patent to the skull base without significant stenosis.

Small left vertebral artery with proximal stenosis. Severe stenosis
left vertebral artery at the C1 level.

Skeleton: Congenital fusion C4-5. Extensive disc degeneration and
spurring C3-4, C5-6, C6-7. No acute skeletal abnormality.

Other neck: 15 mm cyst right cheek. No soft tissue mass or
adenopathy in the neck.

6 mm hypodense nodule left thyroid. No further imaging necessary.
(Ref: [HOSPITAL]. [DATE]): 143-50).

Upper chest: Lung apices clear bilaterally.

Review of the MIP images confirms the above findings

CTA HEAD FINDINGS

Anterior circulation: Heavily calcified internal carotid artery in
the cavernous segment bilaterally. Moderate stenosis on the right
and moderate to severe stenosis on the left. Supraclinoid internal
carotid artery patent bilaterally. Anterior and middle cerebral
arteries patent bilaterally without large vessel occlusion or
aneurysm. Mild stenosis right A2 segment. Mild stenosis left MCA
branches

Posterior circulation: Atherosclerotic calcification and moderate
stenosis distal right vertebral artery involving V3 and V4 segments.
Right vertebral artery patent to the basilar.

Severe stenosis left vertebral artery at C1. Occlusion left V4
segment. Left vertebral artery does not contribute to the basilar.

Basilar is patent.  Posterior cerebral arteries patent bilaterally.

Venous sinuses: Normal venous enhancement

Anatomic variants: None

Review of the MIP images confirms the above findings
IMPRESSION: 1. CT head negative for acute abnormality. Patchy white matter
hypodensity compatible with chronic microvascular ischemia.
2. Atherosclerotic aortic arch. Atherosclerotic calcification in the
carotid bifurcation bilaterally. 40% diameter stenosis internal
carotid artery bilaterally. In addition, there is at least moderate
stenosis right cavernous carotid and moderate to severe stenosis
left cavernous carotid due to extensive calcification.
3. Mild stenosis left MCA branches and right A2 segment.
4. Diffusely disease left vertebral artery with occlusion left V4
segment. Moderate stenosis distal right vertebral artery.

## 2024-02-23 ENCOUNTER — Other Ambulatory Visit: Payer: Self-pay | Admitting: Nurse Practitioner

## 2024-02-23 DIAGNOSIS — G629 Polyneuropathy, unspecified: Secondary | ICD-10-CM

## 2024-04-05 ENCOUNTER — Ambulatory Visit: Payer: Medicare Other

## 2024-04-05 VITALS — Ht 67.0 in | Wt 163.0 lb

## 2024-04-05 DIAGNOSIS — Z0000 Encounter for general adult medical examination without abnormal findings: Secondary | ICD-10-CM | POA: Diagnosis not present

## 2024-04-05 NOTE — Progress Notes (Signed)
 "  Chief Complaint  Patient presents with   Medicare Wellness     Subjective:   Andrew Jones is a 84 y.o. male who presents for a Medicare Annual Wellness Visit.  Visit info / Clinical Intake: Medicare Wellness Visit Type:: Subsequent Annual Wellness Visit Persons participating in visit and providing information:: patient Medicare Wellness Visit Mode:: Video Since this visit was completed virtually, some vitals may be partially provided or unavailable. Missing vitals are due to the limitations of the virtual format.: Documented vitals are patient reported If Telephone or Video please confirm:: I connected with patient using audio/video enable telemedicine. I verified patient identity with two identifiers, discussed telehealth limitations, and patient agreed to proceed. Patient Location:: home Provider Location:: office Interpreter Needed?: No Pre-visit prep was completed: yes AWV questionnaire completed by patient prior to visit?: yes Date:: 04/05/24 Living arrangements:: (Patient-Rptd) lives with spouse/significant other Patient's Overall Health Status Rating: (Patient-Rptd) very good Typical amount of pain: (Patient-Rptd) none Does pain affect daily life?: (Patient-Rptd) no Are you currently prescribed opioids?: no  Dietary Habits and Nutritional Risks How many meals a day?: (Patient-Rptd) 3 Eats fruit and vegetables daily?: (Patient-Rptd) yes Most meals are obtained by: (Patient-Rptd) preparing own meals In the last 2 weeks, have you had any of the following?: none Diabetic:: (!) yes Any non-healing wounds?: no How often do you check your BS?: 0  Functional Status Activities of Daily Living (to include ambulation/medication): (Patient-Rptd) Independent Ambulation: (Patient-Rptd) Independent Medication Administration: (Patient-Rptd) Independent Home Management (perform basic housework or laundry): (Patient-Rptd) Independent Manage your own finances?: (Patient-Rptd)  yes Primary transportation is: (Patient-Rptd) driving Concerns about vision?: no *vision screening is required for WTM* Concerns about hearing?: (!) yes Uses hearing aids?: (!) yes  Fall Screening Falls in the past year?: (Patient-Rptd) 0 Number of falls in past year: 0 Was there an injury with Fall?: 0 Fall Risk Category Calculator: 0 Patient Fall Risk Level: Low Fall Risk  Fall Risk Patient at Risk for Falls Due to: Medication side effect Fall risk Follow up: Falls prevention discussed; Falls evaluation completed  Home and Transportation Safety: All rugs have non-skid backing?: (Patient-Rptd) yes All stairs or steps have railings?: (Patient-Rptd) N/A, no stairs Grab bars in the bathtub or shower?: (Patient-Rptd) yes Have non-skid surface in bathtub or shower?: (Patient-Rptd) yes Good home lighting?: (Patient-Rptd) yes Regular seat belt use?: (Patient-Rptd) yes Hospital stays in the last year:: (Patient-Rptd) no  Cognitive Assessment Difficulty concentrating, remembering, or making decisions? : (Patient-Rptd) no Will 6CIT or Mini Cog be Completed: yes What year is it?: 0 points What month is it?: 0 points Give patient an address phrase to remember (5 components): [address] [place] About what time is it?: 0 points Count backwards from 20 to 1: 0 points Say the months of the year in reverse: 0 points Repeat the address phrase from earlier: 0 points 6 CIT Score: 0 points  Advance Directives (For Healthcare) Does Patient Have a Medical Advance Directive?: Yes Type of Advance Directive: Healthcare Power of [place]; Living will Copy of Healthcare Power of Attorney in Chart?: No - copy requested Copy of Living Will in Chart?: No - copy requested  Reviewed/Updated  Reviewed/Updated: Reviewed All (Medical, Surgical, Family, Medications, Allergies, Care Teams, Patient Goals)    Allergies (verified) Simvastatin   Current Medications (verified) Outpatient  Encounter Medications as of 04/05/2024  Medication Sig   Alirocumab 75 MG/ML SOAJ Inject 75 mg into the skin every 14 (fourteen) days.   aspirin  EC  81 MG tablet Take 1 tablet (81 mg total) by mouth daily. Swallow whole.   benazepril  (LOTENSIN ) 10 MG tablet Take 10 mg by mouth daily.   Calcium -Cholecalciferol (QC CALCIUM  500MG -D3) 500-5 MG-MCG TABS Take 1 tablet by mouth in the morning and at bedtime.   empagliflozin  (JARDIANCE ) 25 MG TABS tablet Take 1 tablet (25 mg total) by mouth daily.   etanercept (ENBREL) 50 MG/ML injection Inject 50 mg into the skin once a week. Every 10days   Magnesium  Oxide 420 MG TABS Take 420 mg by mouth daily after breakfast.   metFORMIN  (GLUCOPHAGE ) 1000 MG tablet Take 1,000 mg by mouth 2 (two) times daily with a meal.   metoprolol  tartrate (LOPRESSOR ) 25 MG tablet Take 25 mg by mouth 2 (two) times daily.   Multiple Vitamin (MULTIVITAMIN) tablet Take 1 tablet by mouth daily.   Omega-3 Fatty Acids (FISH OIL) 1000 MG CAPS Take by mouth.   pregabalin  (LYRICA ) 75 MG capsule Take 1 capsule (75 mg total) by mouth 2 (two) times daily. Schedule office visit with pcp for additional refills   RESTASIS  0.05 % ophthalmic emulsion Place 1 drop into both eyes 2 (two) times daily.   Semaglutide,0.25 or 0.5MG /DOS, 2 MG/3ML SOPN Inject 0.5 mg into the skin every 7 (seven) days.   No facility-administered encounter medications on file as of 04/05/2024.    History: Past Medical History:  Diagnosis Date   Anemia due to GI blood loss 08/25/2022   Ankylosing spondylitis (HCC)    back, hips - on embrel 2000-2008, resumed 12/2012   CVA (cerebral vascular accident) (HCC) 1999, 2002   no residual problems   Diabetes mellitus, type 2 (HCC)    Dyslipidemia    Hypertension    Neuropathy    Bilateral Hands/Feet   Osteoarthritis    Seizure disorder (HCC) 05/23/2009   Sleep apnea    uses c-pap   Past Surgical History:  Procedure Laterality Date   ANTERIOR CERVICAL DECOMP/DISCECTOMY  FUSION N/A 07/12/2022   Procedure: Anterior Cervical Decompression/Discectomy Fusion Cervical Five-Six, Cervical Six-Seven;  Surgeon: Lanis Pupa, MD;  Location: [facility] OR;  Service: Neurosurgery;  Laterality: N/A;  3C   CATARACT EXTRACTION W/ INTRAOCULAR LENS IMPLANT Bilateral 2022   COLONOSCOPY  2015   INGUINAL HERNIA REPAIR Left 1947   Family History  Problem Relation Age of Onset   Heart failure Mother    Heart disease Mother    COPD Father    Prostate cancer Brother    Colon cancer Neg Hx    Stomach cancer Neg Hx    Colon polyps Neg Hx    Rectal cancer Neg Hx    Esophageal cancer Neg Hx    Social History   Occupational History   Occupation: Retired   Occupation: retired  Tobacco Use   Smoking status: Former    Current packs/day: 0.00    Types: Cigarettes    Quit date: 1994    Years since quitting: 32.0   Smokeless tobacco: Never   Tobacco comments:    30 yrs ago  Vaping Use   Vaping status: Never Used  Substance and Sexual Activity   Alcohol use: Not Currently   Drug use: No   Sexual activity: Yes   Tobacco Counseling Counseling given: Not Answered Tobacco comments: 30 yrs ago  SDOH Screenings   Food Insecurity: No Food Insecurity (04/05/2024)  Housing: Unknown (04/05/2024)  Transportation Needs: No Transportation Needs (04/05/2024)  Utilities: Not At Risk (04/05/2024)  Alcohol Screen: Low Risk (04/05/2024)  Depression (PHQ2-9): Low Risk (04/05/2024)  Financial Resource Strain: Low Risk (04/05/2024)  Physical Activity: Sufficiently Active (04/05/2024)  Social Connections: Moderately Isolated (04/05/2024)  Stress: No Stress Concern Present (04/05/2024)  Tobacco Use: Medium Risk (04/05/2024)  Health Literacy: Adequate Health Literacy (04/05/2024)   See flowsheets for full screening details  Depression Screen PHQ 2 & 9 Depression Scale- Over the past 2 weeks, how often have you been bothered by any of the following problems? Little interest or pleasure in doing  things: 0 Feeling down, depressed, or hopeless (PHQ Adolescent also includes...irritable): 0 PHQ-2 Total Score: 0 Trouble falling or staying asleep, or sleeping too much: 0 Feeling tired or having little energy: 0 Poor appetite or overeating (PHQ Adolescent also includes...weight loss): 0 Feeling bad about yourself - or that you are a failure or have let yourself or your family down: 0 Trouble concentrating on things, such as reading the newspaper or watching television (PHQ Adolescent also includes...like school work): 0 Moving or speaking so slowly that other people could have noticed. Or the opposite - being so fidgety or restless that you have been moving around a lot more than usual: 0 Thoughts that you would be better off dead, or of hurting yourself in some way: 0 PHQ-9 Total Score: 0 If you checked off any problems, how difficult have these problems made it for you to do your work, take care of things at home, or get along with other people?: Not difficult at all  Depression Treatment Depression Interventions/Treatment : EYV7-0 Score <4 Follow-up Not Indicated     Goals Addressed             This Visit's Progress    Patient Stated       04/05/2024, stay healthy             Objective:    Today's Vitals   04/05/24 1415  Weight: 163 lb (73.9 kg)  Height: 5' 7 (1.702 m)   Body mass index is 25.53 kg/m.  Hearing/Vision screen Hearing Screening - Comments:: Keeps hearing aids maintained Vision Screening - Comments:: Regular eye exams Immunizations and Health Maintenance Health Maintenance  Topic Date Due   OPHTHALMOLOGY EXAM  08/23/2022   HEMOGLOBIN A1C  04/14/2024   COVID-19 Vaccine (12 - Moderna risk 2025-26 season) 07/07/2024   FOOT EXAM  10/06/2024   Diabetic kidney evaluation - eGFR measurement  10/12/2024   Diabetic kidney evaluation - Urine ACR  10/12/2024   Medicare Annual Wellness (AWV)  04/05/2025   Pneumococcal Vaccine: 50+ Years  Completed    Influenza Vaccine  Completed   Zoster Vaccines- Shingrix  Completed   Meningococcal B Vaccine  Aged Out   DTaP/Tdap/Td  Discontinued   Colonoscopy  Discontinued   Hepatitis C Screening  Discontinued        Assessment/Plan:  This is a routine wellness examination for Vu.  Patient Care Team: Nche, Roselie [place], NP as PCP - General (Internal Medicine) Rosemarie Eather RAMAN, MD (Neurology) Oman, Heather, OD [facility])  I have personally reviewed and noted the following in the patients chart:   Medical and social history Use of alcohol, tobacco or illicit drugs  Current medications and supplements including opioid prescriptions. Functional ability and status Nutritional status Physical activity Advanced directives List of other physicians Hospitalizations, surgeries, and ER visits in previous 12 months Vitals Screenings to include cognitive, depression, and falls Referrals and appointments  No orders of the defined types were placed in this encounter.  In addition, I have  reviewed and discussed with patient certain preventive protocols, quality metrics, and best practice recommendations. A written personalized care plan for preventive services as well as general preventive health recommendations were provided to patient.   Ardella FORBES Dawn, LPN   8/[id number]/7973   Return in 1 year (on 04/05/2025).  After Visit Summary: (MyChart) Due to this being a telephonic visit, the after visit summary with patients personalized plan was offered to patient via MyChart   Nurse Notes: HM Addressed: Due for eye exam  "

## 2024-04-05 NOTE — Patient Instructions (Signed)
 Andrew Jones,  Thank you for taking the time for your Medicare Wellness Visit. I appreciate your continued commitment to your health goals. Please review the care plan we discussed, and feel free to reach out if I can assist you further.  Please note that Annual Wellness Visits do not include a physical exam. Some assessments may be limited, especially if the visit was conducted virtually. If needed, we may recommend an in-person follow-up with your provider.  Ongoing Care Seeing your primary care provider every 3 to 6 months helps us  monitor your health and provide consistent, personalized care.   Referrals If a referral was made during today's visit and you haven't received any updates within two weeks, please contact the referred provider directly to check on the status.  Recommended Screenings:  Health Maintenance  Topic Date Due   Eye exam for diabetics  08/23/2022   Medicare Annual Wellness Visit  03/30/2024   Hemoglobin A1C  04/14/2024   COVID-19 Vaccine (12 - Moderna risk 2025-26 season) 07/07/2024   Complete foot exam   10/06/2024   Yearly kidney function blood test for diabetes  10/12/2024   Yearly kidney health urinalysis for diabetes  10/12/2024   Pneumococcal Vaccine for age over 1  Completed   Flu Shot  Completed   Zoster (Shingles) Vaccine  Completed   Meningitis B Vaccine  Aged Out   DTaP/Tdap/Td vaccine  Discontinued   Colon Cancer Screening  Discontinued   Hepatitis C Screening  Discontinued       04/05/2024    1:30 PM  Advanced Directives  Does Patient Have a Medical Advance Directive? Yes  Type of Estate Agent of [place];Living will  Copy of Healthcare Power of Attorney in Chart? No - copy requested    Vision: Annual vision screenings are recommended for early detection of glaucoma, cataracts, and diabetic retinopathy. These exams can also reveal signs of chronic conditions such as diabetes and high blood pressure.  Dental: Annual  dental screenings help detect early signs of oral cancer, gum disease, and other conditions linked to overall health, including heart disease and diabetes.  Please see the attached documents for additional preventive care recommendations.

## 2024-04-23 ENCOUNTER — Encounter: Payer: Self-pay | Admitting: Nurse Practitioner
